# Patient Record
Sex: Female | Born: 1945 | Race: White | Hispanic: No | Smoking: Never smoker
Health system: Southern US, Community
[De-identification: ages and names within clinical notes are randomized; demographics above are authoritative.]

## PROBLEM LIST (undated history)

## (undated) DIAGNOSIS — H269 Unspecified cataract: Secondary | ICD-10-CM

## (undated) DIAGNOSIS — T7840XA Allergy, unspecified, initial encounter: Secondary | ICD-10-CM

## (undated) DIAGNOSIS — M1990 Unspecified osteoarthritis, unspecified site: Secondary | ICD-10-CM

## (undated) DIAGNOSIS — E119 Type 2 diabetes mellitus without complications: Secondary | ICD-10-CM

## (undated) DIAGNOSIS — E079 Disorder of thyroid, unspecified: Secondary | ICD-10-CM

## (undated) DIAGNOSIS — J45909 Unspecified asthma, uncomplicated: Secondary | ICD-10-CM

## (undated) DIAGNOSIS — K2970 Gastritis, unspecified, without bleeding: Secondary | ICD-10-CM

## (undated) DIAGNOSIS — K8020 Calculus of gallbladder without cholecystitis without obstruction: Secondary | ICD-10-CM

## (undated) DIAGNOSIS — M549 Dorsalgia, unspecified: Secondary | ICD-10-CM

## (undated) DIAGNOSIS — K2270 Barrett's esophagus without dysplasia: Secondary | ICD-10-CM

## (undated) DIAGNOSIS — D649 Anemia, unspecified: Secondary | ICD-10-CM

## (undated) DIAGNOSIS — F32A Depression, unspecified: Secondary | ICD-10-CM

## (undated) DIAGNOSIS — G4730 Sleep apnea, unspecified: Secondary | ICD-10-CM

## (undated) DIAGNOSIS — K219 Gastro-esophageal reflux disease without esophagitis: Secondary | ICD-10-CM

## (undated) DIAGNOSIS — M810 Age-related osteoporosis without current pathological fracture: Secondary | ICD-10-CM

## (undated) DIAGNOSIS — R413 Other amnesia: Secondary | ICD-10-CM

## (undated) DIAGNOSIS — E785 Hyperlipidemia, unspecified: Secondary | ICD-10-CM

## (undated) DIAGNOSIS — F4310 Post-traumatic stress disorder, unspecified: Secondary | ICD-10-CM

## (undated) DIAGNOSIS — N3090 Cystitis, unspecified without hematuria: Secondary | ICD-10-CM

## (undated) DIAGNOSIS — F329 Major depressive disorder, single episode, unspecified: Secondary | ICD-10-CM

## (undated) HISTORY — DX: Other amnesia: R41.3

## (undated) HISTORY — PX: COLONOSCOPY: SHX174

## (undated) HISTORY — DX: Depression, unspecified: F32.A

## (undated) HISTORY — DX: Type 2 diabetes mellitus without complications: E11.9

## (undated) HISTORY — PX: GANGLION CYST EXCISION: SHX1691

## (undated) HISTORY — DX: Allergy, unspecified, initial encounter: T78.40XA

## (undated) HISTORY — DX: Calculus of gallbladder without cholecystitis without obstruction: K80.20

## (undated) HISTORY — DX: Gastro-esophageal reflux disease without esophagitis: K21.9

## (undated) HISTORY — DX: Major depressive disorder, single episode, unspecified: F32.9

## (undated) HISTORY — DX: Sleep apnea, unspecified: G47.30

## (undated) HISTORY — DX: Unspecified cataract: H26.9

## (undated) HISTORY — DX: Disorder of thyroid, unspecified: E07.9

## (undated) HISTORY — DX: Gastritis, unspecified, without bleeding: K29.70

## (undated) HISTORY — PX: TONSILLECTOMY AND ADENOIDECTOMY: SUR1326

## (undated) HISTORY — DX: Anemia, unspecified: D64.9

## (undated) HISTORY — DX: Post-traumatic stress disorder, unspecified: F43.10

## (undated) HISTORY — PX: ESOPHAGOGASTRODUODENOSCOPY: SHX1529

## (undated) HISTORY — DX: Dorsalgia, unspecified: M54.9

## (undated) HISTORY — DX: Unspecified asthma, uncomplicated: J45.909

## (undated) HISTORY — DX: Age-related osteoporosis without current pathological fracture: M81.0

## (undated) HISTORY — DX: Cystitis, unspecified without hematuria: N30.90

## (undated) HISTORY — DX: Hyperlipidemia, unspecified: E78.5

## (undated) HISTORY — DX: Barrett's esophagus without dysplasia: K22.70

## (undated) HISTORY — DX: Unspecified osteoarthritis, unspecified site: M19.90

---

## 1997-06-17 HISTORY — PX: CHOLECYSTECTOMY: SHX55

## 1998-06-17 HISTORY — PX: GASTRIC BYPASS: SHX52

## 2013-04-13 DIAGNOSIS — G4710 Hypersomnia, unspecified: Secondary | ICD-10-CM | POA: Insufficient documentation

## 2013-06-14 DIAGNOSIS — G44209 Tension-type headache, unspecified, not intractable: Secondary | ICD-10-CM | POA: Insufficient documentation

## 2014-12-01 ENCOUNTER — Ambulatory Visit (INDEPENDENT_AMBULATORY_CARE_PROVIDER_SITE_OTHER): Payer: Medicare Other | Admitting: Psychiatry

## 2014-12-01 ENCOUNTER — Encounter (INDEPENDENT_AMBULATORY_CARE_PROVIDER_SITE_OTHER): Payer: Self-pay

## 2014-12-01 ENCOUNTER — Encounter (HOSPITAL_COMMUNITY): Payer: Self-pay | Admitting: Psychiatry

## 2014-12-01 VITALS — BP 111/58 | HR 63 | Ht 64.0 in | Wt 165.2 lb

## 2014-12-01 DIAGNOSIS — F331 Major depressive disorder, recurrent, moderate: Secondary | ICD-10-CM

## 2014-12-01 MED ORDER — VENLAFAXINE HCL ER 150 MG PO TB24: 150.0000 mg | ORAL_TABLET | Freq: Every day | ORAL | Status: DC

## 2014-12-01 MED ORDER — LAMOTRIGINE 25 MG PO TABS: ORAL_TABLET | ORAL | Status: DC
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
## 2014-12-05 ENCOUNTER — Telehealth (HOSPITAL_COMMUNITY): Payer: Self-pay

## 2014-12-05 NOTE — Telephone Encounter (Signed)
Met with Dr. Adele Schilder who authorized requested change from [place] to change patient's Effexor XR to capsules in place of tablets due to insurance coverage.  Called and spoke with Eliezer Lofts, pharmacist at [facility] on [facility] in [place] to inform Dr. Adele Schilder was in support of changing patient's Effexor from tablets to capsules to assist with patient's insurance coverage.

## 2014-12-06 ENCOUNTER — Telehealth (HOSPITAL_COMMUNITY): Payer: Self-pay | Admitting: *Deleted

## 2014-12-06 NOTE — Telephone Encounter (Signed)
Prior authorization received for Venlafaxine. Submitted online with cover my meds.

## 2014-12-07 ENCOUNTER — Telehealth (HOSPITAL_COMMUNITY): Payer: Self-pay

## 2014-12-07 NOTE — Telephone Encounter (Signed)
Medication management - Prior Authorization approval for patient's prescribed Venlafaxine received with start date of 12/06/14 - "until further notice" with HQ#[id number] and Member QD#[id number]

## 2015-01-04 ENCOUNTER — Encounter (HOSPITAL_COMMUNITY): Payer: Self-pay | Admitting: Clinical

## 2015-01-04 ENCOUNTER — Ambulatory Visit (INDEPENDENT_AMBULATORY_CARE_PROVIDER_SITE_OTHER): Payer: Medicare Other | Admitting: Clinical

## 2015-01-04 DIAGNOSIS — F3181 Bipolar II disorder: Secondary | ICD-10-CM
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
## 2015-01-06 ENCOUNTER — Other Ambulatory Visit (HOSPITAL_COMMUNITY): Payer: Self-pay | Admitting: Psychiatry

## 2015-01-06 DIAGNOSIS — F331 Major depressive disorder, recurrent, moderate: Secondary | ICD-10-CM

## 2015-01-06 NOTE — Telephone Encounter (Signed)
Met with Dr. Adele Schilder who authorized a one time refill of patient's recently started Lamicatal at 50m, 2 a day.  [facility] she sees Dr. AAdele Schilderfor follow up appointment scheduled on 01/19/15.

## 2015-01-19 ENCOUNTER — Ambulatory Visit (INDEPENDENT_AMBULATORY_CARE_PROVIDER_SITE_OTHER): Payer: Medicare Other | Admitting: Psychiatry

## 2015-01-19 ENCOUNTER — Encounter (HOSPITAL_COMMUNITY): Payer: Self-pay | Admitting: Psychiatry

## 2015-01-19 VITALS — BP 138/68 | HR 71 | Ht 64.0 in | Wt 162.2 lb

## 2015-01-19 DIAGNOSIS — F331 Major depressive disorder, recurrent, moderate: Secondary | ICD-10-CM

## 2015-01-19 MED ORDER — LAMOTRIGINE 25 MG PO TABS: 50.0000 mg | ORAL_TABLET | Freq: Every day | ORAL | Status: DC

## 2015-01-19 MED ORDER — VENLAFAXINE HCL ER 150 MG PO TB24: 150.0000 mg | ORAL_TABLET | Freq: Every day | ORAL | Status: DC
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
## 2015-01-23 ENCOUNTER — Telehealth (HOSPITAL_COMMUNITY): Payer: Self-pay

## 2015-01-23 DIAGNOSIS — F331 Major depressive disorder, recurrent, moderate: Secondary | ICD-10-CM

## 2015-01-23 MED ORDER — VENLAFAXINE HCL ER 150 MG PO CP24: 150.0000 mg | ORAL_CAPSULE | Freq: Every day | ORAL | Status: DC

## 2015-01-23 NOTE — Telephone Encounter (Signed)
Met with Dr. Adele Schilder who authorized changing patient's Effexor XR to capsule form.  Telephone call with patient to inform a new order was being sent to her [place] as patient reported she is experiencing some withdrawal symptoms of dizziness going the past 3 days without medication.  Patient warned about fall precautions and will pick up this date to restart.   Patient to call if any further problems.

## 2015-01-23 NOTE — Telephone Encounter (Signed)
Medication management - patient reports her insurance will not cover Venlafaxine tablets but will cover capsules so requests a new order for change to capule form of Effexor XR

## 2015-01-30 ENCOUNTER — Encounter (HOSPITAL_COMMUNITY): Payer: Self-pay | Admitting: Clinical

## 2015-01-30 ENCOUNTER — Ambulatory Visit (INDEPENDENT_AMBULATORY_CARE_PROVIDER_SITE_OTHER): Payer: Medicare Other | Admitting: Clinical

## 2015-01-30 DIAGNOSIS — F3181 Bipolar II disorder: Secondary | ICD-10-CM

## 2015-01-30 NOTE — Progress Notes (Signed)
   THERAPIST PROGRESS NOTE  Session Time: 2:30 -3:28  Participation Level: Active  Behavioral Response: CasualAlertDepressed  Type of Therapy: Individual Therapy  Treatment Goals addressed: Improve psychiatric symptoms,  impulse control  Interventions:motivational interviewing, cbt, grounding and mindfulness techniques  Summary: Alexis [name] is a 69 y.o. female who presents with Bipolar II disorder.   Suicidal/Homicidal: No -without intent/plan  Therapist Response:  Alexis [name] met with clinician for an individual session. Alexis [name] discussed her psychiatric symptoms and her current life events. Alexis [name] shared that she had additional symptoms that she had not discussed in her initial assessment. She shared that she has an obsession with picking her scabs over and over. She shared that she does not intentional start the wounds, that they are started by things such as cat scratches or bumping into things. She shared that as she aged her skin has gotten much thinner and her wounds are more frequent. She shared that can control it now, when in public ( used to not be able to.) "I do it mostly at night or when in bed. I have a lot of scars from years of doing it." Clinician  Encourage client to update her psychiatrist on the additional symptoms. Alexis [name] agreed to do so. She stated that she would be seeing him in the beginning of next week. Client and clinician discussed the skills she would like to work on to improve her psychiatric symptoms. Alexis [name] and clinician discussed grounding and mindfulness techniques. Clinician explained there process, purpose and practice of the techniques. Client and clinician practiced some of the techniques together. Alexis [name] asked clarifying questions which clinician answered. Alexis [name] agreed to practice the techniques daily until next session.       Plan: Return again in 1-2 weeks.  Diagnosis: Axis I: Bipolar II    Continue to assess for  OCD            Alexis [name] A, LCSW 01/30/2015

## 2015-02-13 ENCOUNTER — Ambulatory Visit (HOSPITAL_COMMUNITY): Payer: Self-pay | Admitting: Clinical

## 2015-02-27 ENCOUNTER — Encounter (HOSPITAL_COMMUNITY): Payer: Self-pay | Admitting: Clinical

## 2015-02-27 ENCOUNTER — Ambulatory Visit (INDEPENDENT_AMBULATORY_CARE_PROVIDER_SITE_OTHER): Payer: Medicare Other | Admitting: Clinical

## 2015-02-27 DIAGNOSIS — F3181 Bipolar II disorder: Secondary | ICD-10-CM | POA: Diagnosis not present

## 2015-02-27 NOTE — Progress Notes (Signed)
   THERAPIST PROGRESS NOTE  Session Time: 10:06 - 11:02  Participation Level: Active  Behavioral Response: CasualAlertAnxious  Type of Therapy: Individual Therapy  Treatment Goals addressed: Improve psychiatric symptoms, Interpersonal relationship skills, improve unhelpful thought patterns, impulse control  Interventions: cbt, motivational interviewing, cbt, grounding and mindfulness techniques  Summary: Alexis [name] is a 69 y.o. female who presents with Bipolar II disorder.   Suicidal/Homicidal: No -without intent/plan  Therapist Response:  Alexis [name] met with clinician for an individual session. Alexis [name] discussed her psychiatric symptoms, her current life events, and her homework. Alexis [name] shared that she had been practicing the grounding and mindfulness techniques. She shared that she found them helpful. Client and clinician discussed wthe grounding techniques she used. She shared that they helped her with her anxiety and to shift her thoughts. Clinician introduced additional grounding and mindfulness techniques. Client and clinician practiced some of the techniques together. Alexis [name] shared that some about her relationship with her children. Client and clinician discussed some basic cbt concepts. Client and clinician discussed how her thoughts affect her behaviors and emotions. Client and clinician discussed how this could apply to her relationships. Client agreed to complete a cbt packet and to continue practicing grounding and mindfulness techniques until next session   Plan: Return again in 1-2 weeks.  Diagnosis: Axis I: Bipolar II   Maycol Hoying A, LCSW 02/27/2015

## 2015-03-21 ENCOUNTER — Encounter (HOSPITAL_COMMUNITY): Payer: Self-pay | Admitting: Psychiatry

## 2015-03-21 ENCOUNTER — Ambulatory Visit (INDEPENDENT_AMBULATORY_CARE_PROVIDER_SITE_OTHER): Payer: Medicare Other | Admitting: Psychiatry

## 2015-03-21 VITALS — BP 120/69 | HR 63 | Ht 64.0 in | Wt 157.0 lb

## 2015-03-21 DIAGNOSIS — F331 Major depressive disorder, recurrent, moderate: Secondary | ICD-10-CM

## 2015-03-21 MED ORDER — LAMOTRIGINE 25 MG PO TABS: 50.0000 mg | ORAL_TABLET | Freq: Every day | ORAL | Status: DC

## 2015-03-21 MED ORDER — VENLAFAXINE HCL ER 150 MG PO CP24: 150.0000 mg | ORAL_CAPSULE | Freq: Every day | ORAL | Status: DC
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
## 2015-03-30 ENCOUNTER — Encounter (HOSPITAL_COMMUNITY): Payer: Self-pay | Admitting: Clinical

## 2015-03-30 ENCOUNTER — Ambulatory Visit (INDEPENDENT_AMBULATORY_CARE_PROVIDER_SITE_OTHER): Payer: Medicare Other | Admitting: Clinical

## 2015-03-30 DIAGNOSIS — F3181 Bipolar II disorder: Secondary | ICD-10-CM | POA: Diagnosis not present
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
## 2015-04-27 ENCOUNTER — Ambulatory Visit (HOSPITAL_COMMUNITY): Payer: Self-pay | Admitting: Clinical

## 2015-05-08 ENCOUNTER — Ambulatory Visit (INDEPENDENT_AMBULATORY_CARE_PROVIDER_SITE_OTHER): Payer: Medicare Other | Admitting: Clinical

## 2015-05-08 DIAGNOSIS — F3181 Bipolar II disorder: Secondary | ICD-10-CM | POA: Diagnosis not present

## 2015-05-08 NOTE — Progress Notes (Signed)
   THERAPIST PROGRESS NOTE  Session Time: 10:11 - 10:58   Participation Level: Active  Behavioral Response: CasualAlertAnxious  Type of Therapy: Individual Therapy  Treatment Goals addressed: Improve psychiatric symptoms, Interpersonal relationship skills, improve unhelpful thought patterns, impulse control  Interventions: cbt, motivational interviewing, cbt, grounding and mindfulness techniques  Summary: Alexis [name] is a 69 y.o. female who presents with Bipolar II disorder.   Suicidal/Homicidal: No -without intent/plan  Therapist Response:  Alexis [name] met with clinician for an individual session. Alexis [name] discussed her psychiatric symptoms, her current life events, and her homework. Alexis [name] shared that she had completed her packet on Bipolar and stated "well I am definitely bipolar. She shared about the symptoms she experiences and her high and low moods. She shared her other thoughts and insights from her homework. She stated that recently she has been obsessed with her fit bit and has been working to be more realistic with her goals. Client and clinician discussed this in relationship to her impulse control goal. Alexis [name] stated she has been sick for the last month and is concerned because she was told that she needs a biopsy on her breast. She shared how she is coping with the news. She has told her daughter which is new for her. She shared that in an effort to have a better relationship she has been more forth coming. Alexis [name] and clinician discussed the importance of healthy openness to improve her interpersonal relationships. Alexis [name] also shared about how the election affected her. She shared she felt agitated and up set. Client and clinician discussed her grounding and mindfulness skills to help with her emotions. Client and clinician discussed ways to direct her emotions towards healthy and meaningful behaviors. Alexis [name] agreed to continue her homework until next session      Plan: Return  again in 1-2 weeks.  Diagnosis: Axis I: Bipolar II   Alexis [name] A, LCSW 05/08/2015

## 2015-05-11 DIAGNOSIS — R920 Mammographic microcalcification found on diagnostic imaging of breast: Secondary | ICD-10-CM | POA: Insufficient documentation

## 2015-05-14 ENCOUNTER — Encounter (HOSPITAL_COMMUNITY): Payer: Self-pay | Admitting: Clinical

## 2015-05-17 DIAGNOSIS — T148XXA Other injury of unspecified body region, initial encounter: Secondary | ICD-10-CM | POA: Insufficient documentation

## 2015-06-06 ENCOUNTER — Ambulatory Visit (HOSPITAL_COMMUNITY): Payer: Self-pay | Admitting: Clinical

## 2015-06-20 ENCOUNTER — Encounter (HOSPITAL_COMMUNITY): Payer: Self-pay | Admitting: Psychiatry

## 2015-06-20 ENCOUNTER — Ambulatory Visit (INDEPENDENT_AMBULATORY_CARE_PROVIDER_SITE_OTHER): Payer: Medicare Other | Admitting: Psychiatry

## 2015-06-20 VITALS — BP 107/55 | HR 59 | Wt 167.0 lb

## 2015-06-20 DIAGNOSIS — F331 Major depressive disorder, recurrent, moderate: Secondary | ICD-10-CM | POA: Diagnosis not present

## 2015-06-20 MED ORDER — LAMOTRIGINE 25 MG PO TABS: 50.0000 mg | ORAL_TABLET | Freq: Every day | ORAL | Status: DC

## 2015-06-20 MED ORDER — VENLAFAXINE HCL ER 150 MG PO CP24: 150.0000 mg | ORAL_CAPSULE | Freq: Every day | ORAL | Status: DC
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
## 2015-06-27 ENCOUNTER — Ambulatory Visit (HOSPITAL_COMMUNITY): Payer: Self-pay | Admitting: Psychiatry

## 2015-06-29 ENCOUNTER — Ambulatory Visit (INDEPENDENT_AMBULATORY_CARE_PROVIDER_SITE_OTHER): Payer: Medicare Other | Admitting: Clinical

## 2015-06-29 ENCOUNTER — Encounter (HOSPITAL_COMMUNITY): Payer: Self-pay | Admitting: Clinical

## 2015-06-29 DIAGNOSIS — F3181 Bipolar II disorder: Secondary | ICD-10-CM

## 2015-06-29 NOTE — Progress Notes (Signed)
   THERAPIST PROGRESS NOTE  Session Time: 10:05 -11:00  Participation Level: Active  Behavioral Response: CasualAlertNA  Type of Therapy: Individual Therapy  Treatment Goals addressed: Improve psychiatric symptoms, Interpersonal relationship skills,  impulse control  Interventions: cbt, motivational interviewing, cbt,   Summary: Olia Hinderliter is a 70 y.o. female who presents with Bipolar II disorder.   Suicidal/Homicidal: No -without intent/plan  Therapist Response:  Katharine Look met with clinician for an individual session. Jacqualin discussed her psychiatric symptoms, her current life events, and her homework. Katharine Look and clinician reviewed and discussed her bi-polar packet homework. Onie shared her thoughts and insights about her homework. She shared about monitoring her symptoms and red flags. She shared that she is doing better at not purchasing. She shared that she still some has trouble with tv purchases especially when anxious. Client and clinician discussed her thoughts prior to and about purchasing. Client and clinician discussed what she could do to decrease those purchases. She shared that she that she currently turns the purchase channel on 3 times a day. She shared that she could try to limit her exposure especially when feeling anxious. Malachy Moan shared that she had her biopsy and it came back negative. This was a big relief for her and her daughter. It was new for her to  Share with her daughter. Her daughter is often "nosey" and so it had been difficult for her to decide what to share and what to not share. She stated she felt good about sharing this because it allowed for her to receive support from her daughter. Sheresa shared that she has been using her interpersonal relationship skills to set boundaries with her daughter with other things that are not her business. She shared that this also has made her feel more relaxed in the relationship. Marlaya shared that she is still having trouble  with picking her scabs. She stated that she is not currently doing this while she is awake but is when she sleeps. Client and clinician discussed this and possible solutions such as wearing soft gloves, placing Band-Aids on wounds. Client and clinician discussed the use of mindfulness and grounding to assist with her impulse control. Anesha agreed to continue her homework until next session.  Plan: Return again in 2-3 weeks.  Diagnosis: Axis I: Bipolar II    Avrom Robarts A, LCSW 06/29/2015

## 2015-07-11 ENCOUNTER — Encounter (HOSPITAL_COMMUNITY): Payer: Self-pay | Admitting: Clinical

## 2015-07-11 ENCOUNTER — Ambulatory Visit (INDEPENDENT_AMBULATORY_CARE_PROVIDER_SITE_OTHER): Payer: Medicare Other | Admitting: Clinical

## 2015-07-11 DIAGNOSIS — F3181 Bipolar II disorder: Secondary | ICD-10-CM

## 2015-07-11 NOTE — Progress Notes (Signed)
   THERAPIST PROGRESS NOTE  Session Time: 10:00 -10:57  Participation Level: Active  Behavioral Response: CasualAlertNA  Type of Therapy: Individual Therapy  Treatment Goals addressed: Improve psychiatric symptoms, Interpersonal relationship skills, improve unhelpful thought patterns, impulse control  Interventions: cbt, motivational interviewing, cbt,   Summary: Alexis [name] is a 70 y.o. female who presents with Bipolar II disorder.   Suicidal/Homicidal: No -without intent/plan  Therapist Response:  Katharine Look met with clinician for an individual session. [name] discussed her psychiatric symptoms, her current life events, and her homework. Katharine Look and clinician discussed and reviewed her bipolar homework packet. She shared that she had completed packet two and had some questions which clinician answered. She shared her thoughts and insights about the homework. Client and clinician discussed how our thoughts shape our experiences. [name] shared that she was practicing her grounding and mindfulness techniques and found them useful in distracting her from negative thought patterns and keeping her more present. She shared that she was noticing less irritation with others. She shared that she is finding that she it is becoming easier to approach situations in a more positive way. She stated that since last session she has enjoyed her relationships more. [name] shared some examples. She stated that she was most happy with the changes in her relationship with her daughter. Katharine Look and clinician discussed how she could continue to use her skills to improve. [name] also shared about some of her challenges. Clinician asked open ended questions and [name] identified some strategies to improve her outcomes. [name] agreed to continue her homework until next session    Plan: Return again in 3-4 weeks.  Diagnosis: Axis I: Bipolar II    Charlotta Lapaglia A, LCSW 07/11/2015

## 2015-08-01 ENCOUNTER — Encounter (HOSPITAL_COMMUNITY): Payer: Self-pay | Admitting: Clinical

## 2015-08-01 ENCOUNTER — Ambulatory Visit (INDEPENDENT_AMBULATORY_CARE_PROVIDER_SITE_OTHER): Payer: Medicare Other | Admitting: Clinical

## 2015-08-01 DIAGNOSIS — F3181 Bipolar II disorder: Secondary | ICD-10-CM | POA: Diagnosis not present

## 2015-08-01 NOTE — Progress Notes (Signed)
   THERAPIST PROGRESS NOTE  Session Time: 2:37 -3:20.  Participation Level: Active  Behavioral Response: CasualAlertNA  Type of Therapy: Individual Therapy  Treatment Goals addressed: Improve psychiatric symptoms, Interpersonal relationship skills, improve unhelpful thought patterns, impulse control  Interventions: motivational interviewing,  Summary: Jamirah Zelaya is a 70 y.o. female who presents with Bipolar II disorder.   Suicidal/Homicidal: No -without intent/plan  Therapist Response:  Katharine Look met with clinician for an individual session. Myrla discussed her psychiatric symptoms, her current life events, and her homework. Katharine Look and clinician reviewed her homework packet on Bipolar. Awanda shared her thoughts and insights from the packet. She shared that her symptoms have improved across the board. She shared that her relationship with her daughter has improved since she has set healthy boundaries. She shared she has been able to resist impulsive behaviors, and has seen an improvement with her mood due to practicing her grounding and mindfulness techniques as well as questioning her negative automatic thoughts. Client and clinician discussed her graduating from therapy. She shared that this made her a  Little nervous but agreed that she was succeeding in her goals. Client and clinician agreed to shoot for graduating her in 2-3 sessions. Client and clinician discussed steps she could take to improve and maintain her skills for each of her goals. Lanetta ended session early because things are going well.   Plan: Return again in  3-4 weeks.  Diagnosis: Axis I: Bipolar II   Caliber Landess A, LCSW 08/01/2015

## 2015-08-04 DIAGNOSIS — Z79899 Other long term (current) drug therapy: Secondary | ICD-10-CM | POA: Diagnosis not present

## 2015-08-04 DIAGNOSIS — D649 Anemia, unspecified: Secondary | ICD-10-CM | POA: Diagnosis not present

## 2015-08-04 DIAGNOSIS — E119 Type 2 diabetes mellitus without complications: Secondary | ICD-10-CM | POA: Diagnosis not present

## 2015-08-04 DIAGNOSIS — E559 Vitamin D deficiency, unspecified: Secondary | ICD-10-CM | POA: Diagnosis not present

## 2015-08-04 DIAGNOSIS — R5383 Other fatigue: Secondary | ICD-10-CM | POA: Diagnosis not present

## 2015-08-04 DIAGNOSIS — E782 Mixed hyperlipidemia: Secondary | ICD-10-CM | POA: Diagnosis not present

## 2015-08-24 ENCOUNTER — Encounter (HOSPITAL_COMMUNITY): Payer: Self-pay | Admitting: Clinical

## 2015-08-24 ENCOUNTER — Ambulatory Visit (INDEPENDENT_AMBULATORY_CARE_PROVIDER_SITE_OTHER): Payer: Medicare Other | Admitting: Clinical

## 2015-08-24 DIAGNOSIS — F3181 Bipolar II disorder: Secondary | ICD-10-CM | POA: Diagnosis not present

## 2015-08-24 NOTE — Progress Notes (Signed)
   THERAPIST PROGRESS NOTE  Session Time: 11:05 - 12:00  Participation Level: Active  Behavioral Response: Casual and NeatAlertNA  Type of Therapy: Individual Therapy  Treatment Goals addressed: Improve psychiatric symptoms, Interpersonal relationship skills, improve unhelpful thought patterns, impulse control  Interventions: cbt, motivational interviewing, cbt, grounding and mindfulness techniques  Summary: Alexis [name] is a 70 y.o. female who presents with Bipolar II disorder.   Suicidal/Homicidal: No -without intent/plan  Therapist Response:  Katharine Look met with clinician for an individual session. Devanshi discussed her psychiatric symptoms, her current life events, and her homework. Takelia shared that she had completed the Bipolar packet 5. Client and cl clinician did reviewed and discussed her homework. Katharine shared her thoughts and insights from the packet. Sharifa shared how it relates to her life experiences. Deniece shared that overall her symptoms continued to improve. She shared how ever this week she had trouble managing some of her eating habits. She shared that she tells herself not to eat something and then does and eats a lot of. This made her feel disappointed in herself as she is the weigher at her healthy weight loss group. She shared that she knew she was in a way and heavy. Client and clinician discussed her thoughts and the emotions that made her feel bad. Client and clinician discussed alternative healthier thoughts. Client and clinician also discussed changing her thoughts a bout. Eating food. Client and clinician discussed mindfulness and being present when she ate. Client and  clinician discussed how to do this the benefits is more pleasure out of eating eating smaller portions and and being conscious of her decision making. Client and clinician discussed the problem with all or nothing thinking. Gender agreed to try the techniques to help with her impulse control when it  comes to food. She shared that her the technique she has been using to control her shopping have been very helpful. Nikiya agreed to complete a homework packet, practice her grounding and mindfulness techniques and practice skills to manage her impulses.   Plan: Return again in  3-4 weeks.  Diagnosis: Axis I: Bipolar II    [name], LCSW 08/24/2015

## 2015-08-31 DIAGNOSIS — R5383 Other fatigue: Secondary | ICD-10-CM | POA: Diagnosis not present

## 2015-08-31 DIAGNOSIS — R42 Dizziness and giddiness: Secondary | ICD-10-CM | POA: Diagnosis not present

## 2015-08-31 DIAGNOSIS — Z780 Asymptomatic menopausal state: Secondary | ICD-10-CM | POA: Diagnosis not present

## 2015-09-01 DIAGNOSIS — R42 Dizziness and giddiness: Secondary | ICD-10-CM | POA: Diagnosis not present

## 2015-09-05 DIAGNOSIS — R42 Dizziness and giddiness: Secondary | ICD-10-CM | POA: Diagnosis not present

## 2015-09-06 DIAGNOSIS — H5213 Myopia, bilateral: Secondary | ICD-10-CM | POA: Diagnosis not present

## 2015-09-06 DIAGNOSIS — H25013 Cortical age-related cataract, bilateral: Secondary | ICD-10-CM | POA: Diagnosis not present

## 2015-09-06 DIAGNOSIS — E119 Type 2 diabetes mellitus without complications: Secondary | ICD-10-CM | POA: Diagnosis not present

## 2015-09-06 DIAGNOSIS — H43811 Vitreous degeneration, right eye: Secondary | ICD-10-CM | POA: Diagnosis not present

## 2015-09-06 DIAGNOSIS — H40013 Open angle with borderline findings, low risk, bilateral: Secondary | ICD-10-CM | POA: Diagnosis not present

## 2015-09-06 DIAGNOSIS — H2513 Age-related nuclear cataract, bilateral: Secondary | ICD-10-CM | POA: Diagnosis not present

## 2015-09-06 DIAGNOSIS — H53469 Homonymous bilateral field defects, unspecified side: Secondary | ICD-10-CM | POA: Diagnosis not present

## 2015-09-18 ENCOUNTER — Ambulatory Visit (INDEPENDENT_AMBULATORY_CARE_PROVIDER_SITE_OTHER): Payer: Medicare Other | Admitting: Psychiatry

## 2015-09-18 ENCOUNTER — Encounter (HOSPITAL_COMMUNITY): Payer: Self-pay | Admitting: Psychiatry

## 2015-09-18 VITALS — BP 116/67 | HR 76 | Ht 64.0 in | Wt 160.6 lb

## 2015-09-18 DIAGNOSIS — F331 Major depressive disorder, recurrent, moderate: Secondary | ICD-10-CM

## 2015-09-18 MED ORDER — VENLAFAXINE HCL ER 150 MG PO CP24: 150.0000 mg | ORAL_CAPSULE | Freq: Every day | ORAL | Status: DC

## 2015-09-18 MED ORDER — LAMOTRIGINE 25 MG PO TABS: 50.0000 mg | ORAL_TABLET | Freq: Every day | ORAL | Status: DC
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
## 2015-09-21 DIAGNOSIS — K219 Gastro-esophageal reflux disease without esophagitis: Secondary | ICD-10-CM | POA: Diagnosis not present

## 2015-09-21 DIAGNOSIS — R42 Dizziness and giddiness: Secondary | ICD-10-CM | POA: Diagnosis not present

## 2015-09-28 ENCOUNTER — Ambulatory Visit (INDEPENDENT_AMBULATORY_CARE_PROVIDER_SITE_OTHER): Payer: Medicare Other | Admitting: Clinical

## 2015-09-28 ENCOUNTER — Encounter (HOSPITAL_COMMUNITY): Payer: Self-pay | Admitting: Clinical

## 2015-09-28 DIAGNOSIS — F3181 Bipolar II disorder: Secondary | ICD-10-CM

## 2015-09-28 NOTE — Progress Notes (Signed)
   THERAPIST PROGRESS NOTE  Session Time: 3:25 - 4:00  Participation Level: Active  Behavioral Response: CasualAlertNA  Type of Therapy: Individual Therapy  Treatment Goals addressed: Improve psychiatric symptoms, Interpersonal relationship skills, improve unhelpful thought patterns, impulse control  Interventions: cbt, motivational interviewing,   Summary: Elisa Kutner is a 70 y.o. female who presents with Bipolar II disorder.   Suicidal/Homicidal: No -without intent/plan  Therapist Response:  Katharine Look met with clinician for an individual session. Venisha discussed her psychiatric symptoms, her current life events, and her homework. Alliana shared that she has been doing very well lately. Client and clinician reviewed and discussed her homework packet. Clinician printed her another and directed her to where she could down load them herself. Client and clinician discussed and reviewed her progress and her skills. Leeloo reported success on all her goals and has been able to maintain her progress. Client and clinician agreed she was ready to graduate. Client shared her gratitude for the support. Clinician informed her how to reengage in services if she should need them. Both client and clinician were happy with Toyia progress  Plan: Return again if services are needed  Diagnosis: Axis I: Bipolar II    Anwar Sakata A, LCSW 09/28/2015

## 2015-10-05 DIAGNOSIS — E119 Type 2 diabetes mellitus without complications: Secondary | ICD-10-CM | POA: Diagnosis not present

## 2015-10-05 DIAGNOSIS — K219 Gastro-esophageal reflux disease without esophagitis: Secondary | ICD-10-CM | POA: Diagnosis not present

## 2015-11-13 DIAGNOSIS — E119 Type 2 diabetes mellitus without complications: Secondary | ICD-10-CM | POA: Diagnosis not present

## 2015-11-13 DIAGNOSIS — Z113 Encounter for screening for infections with a predominantly sexual mode of transmission: Secondary | ICD-10-CM | POA: Diagnosis not present

## 2015-11-13 DIAGNOSIS — E782 Mixed hyperlipidemia: Secondary | ICD-10-CM | POA: Diagnosis not present

## 2015-11-13 DIAGNOSIS — Z0000 Encounter for general adult medical examination without abnormal findings: Secondary | ICD-10-CM | POA: Diagnosis not present

## 2015-11-13 DIAGNOSIS — R0602 Shortness of breath: Secondary | ICD-10-CM | POA: Diagnosis not present

## 2015-11-13 DIAGNOSIS — R5383 Other fatigue: Secondary | ICD-10-CM | POA: Diagnosis not present

## 2015-11-16 DIAGNOSIS — R319 Hematuria, unspecified: Secondary | ICD-10-CM | POA: Diagnosis not present

## 2015-12-04 DIAGNOSIS — E119 Type 2 diabetes mellitus without complications: Secondary | ICD-10-CM | POA: Diagnosis not present

## 2015-12-04 DIAGNOSIS — R319 Hematuria, unspecified: Secondary | ICD-10-CM | POA: Diagnosis not present

## 2015-12-11 DIAGNOSIS — G250 Essential tremor: Secondary | ICD-10-CM | POA: Diagnosis not present

## 2015-12-11 DIAGNOSIS — G4761 Periodic limb movement disorder: Secondary | ICD-10-CM | POA: Diagnosis not present

## 2015-12-20 ENCOUNTER — Ambulatory Visit (HOSPITAL_COMMUNITY): Payer: Self-pay | Admitting: Psychiatry

## 2015-12-21 ENCOUNTER — Ambulatory Visit (INDEPENDENT_AMBULATORY_CARE_PROVIDER_SITE_OTHER): Payer: Medicare Other | Admitting: Psychiatry

## 2015-12-21 ENCOUNTER — Encounter (HOSPITAL_COMMUNITY): Payer: Self-pay | Admitting: Psychiatry

## 2015-12-21 VITALS — BP 90/60 | HR 72 | Ht 64.0 in | Wt 157.6 lb

## 2015-12-21 DIAGNOSIS — F331 Major depressive disorder, recurrent, moderate: Secondary | ICD-10-CM

## 2015-12-21 MED ORDER — VENLAFAXINE HCL ER 150 MG PO CP24: 150.0000 mg | ORAL_CAPSULE | Freq: Every day | ORAL | Status: DC

## 2015-12-21 MED ORDER — LAMOTRIGINE 25 MG PO TABS: 50.0000 mg | ORAL_TABLET | Freq: Every day | ORAL | Status: DC
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
## 2015-12-25 DIAGNOSIS — N763 Subacute and chronic vulvitis: Secondary | ICD-10-CM | POA: Diagnosis not present

## 2015-12-25 DIAGNOSIS — Z01419 Encounter for gynecological examination (general) (routine) without abnormal findings: Secondary | ICD-10-CM | POA: Diagnosis not present

## 2016-01-16 DIAGNOSIS — Z01419 Encounter for gynecological examination (general) (routine) without abnormal findings: Secondary | ICD-10-CM | POA: Insufficient documentation

## 2016-02-27 DIAGNOSIS — Z23 Encounter for immunization: Secondary | ICD-10-CM | POA: Diagnosis not present

## 2016-02-27 DIAGNOSIS — R112 Nausea with vomiting, unspecified: Secondary | ICD-10-CM | POA: Diagnosis not present

## 2016-02-27 DIAGNOSIS — R319 Hematuria, unspecified: Secondary | ICD-10-CM | POA: Diagnosis not present

## 2016-02-27 DIAGNOSIS — R109 Unspecified abdominal pain: Secondary | ICD-10-CM | POA: Diagnosis not present

## 2016-03-01 DIAGNOSIS — R319 Hematuria, unspecified: Secondary | ICD-10-CM | POA: Diagnosis not present

## 2016-03-01 DIAGNOSIS — E119 Type 2 diabetes mellitus without complications: Secondary | ICD-10-CM | POA: Diagnosis not present

## 2016-03-01 DIAGNOSIS — R112 Nausea with vomiting, unspecified: Secondary | ICD-10-CM | POA: Diagnosis not present

## 2016-03-01 DIAGNOSIS — R109 Unspecified abdominal pain: Secondary | ICD-10-CM | POA: Diagnosis not present

## 2016-03-11 DIAGNOSIS — R3129 Other microscopic hematuria: Secondary | ICD-10-CM | POA: Diagnosis not present

## 2016-03-18 DIAGNOSIS — E1165 Type 2 diabetes mellitus with hyperglycemia: Secondary | ICD-10-CM | POA: Diagnosis not present

## 2016-03-18 DIAGNOSIS — D539 Nutritional anemia, unspecified: Secondary | ICD-10-CM | POA: Diagnosis not present

## 2016-03-18 DIAGNOSIS — E782 Mixed hyperlipidemia: Secondary | ICD-10-CM | POA: Diagnosis not present

## 2016-03-18 DIAGNOSIS — K219 Gastro-esophageal reflux disease without esophagitis: Secondary | ICD-10-CM | POA: Diagnosis not present

## 2016-03-18 DIAGNOSIS — R5383 Other fatigue: Secondary | ICD-10-CM | POA: Diagnosis not present

## 2016-03-18 DIAGNOSIS — Z79899 Other long term (current) drug therapy: Secondary | ICD-10-CM | POA: Diagnosis not present

## 2016-03-18 DIAGNOSIS — E559 Vitamin D deficiency, unspecified: Secondary | ICD-10-CM | POA: Diagnosis not present

## 2016-03-21 ENCOUNTER — Other Ambulatory Visit (HOSPITAL_COMMUNITY): Payer: Self-pay | Admitting: Psychiatry

## 2016-03-21 DIAGNOSIS — F331 Major depressive disorder, recurrent, moderate: Secondary | ICD-10-CM

## 2016-03-22 ENCOUNTER — Ambulatory Visit (HOSPITAL_COMMUNITY): Payer: Self-pay | Admitting: Psychiatry

## 2016-03-29 DIAGNOSIS — A048 Other specified bacterial intestinal infections: Secondary | ICD-10-CM | POA: Diagnosis not present

## 2016-03-29 DIAGNOSIS — Z01818 Encounter for other preprocedural examination: Secondary | ICD-10-CM | POA: Diagnosis not present

## 2016-03-29 DIAGNOSIS — K219 Gastro-esophageal reflux disease without esophagitis: Secondary | ICD-10-CM | POA: Diagnosis not present

## 2016-04-02 ENCOUNTER — Other Ambulatory Visit (HOSPITAL_COMMUNITY): Payer: Self-pay | Admitting: Psychiatry

## 2016-04-02 DIAGNOSIS — F331 Major depressive disorder, recurrent, moderate: Secondary | ICD-10-CM

## 2016-04-02 DIAGNOSIS — R3129 Other microscopic hematuria: Secondary | ICD-10-CM | POA: Diagnosis not present

## 2016-04-02 DIAGNOSIS — N359 Urethral stricture, unspecified: Secondary | ICD-10-CM | POA: Diagnosis not present

## 2016-04-04 DIAGNOSIS — K2970 Gastritis, unspecified, without bleeding: Secondary | ICD-10-CM | POA: Diagnosis not present

## 2016-04-04 DIAGNOSIS — A048 Other specified bacterial intestinal infections: Secondary | ICD-10-CM | POA: Diagnosis not present

## 2016-04-04 DIAGNOSIS — K5281 Eosinophilic gastritis or gastroenteritis: Secondary | ICD-10-CM | POA: Diagnosis not present

## 2016-04-09 DIAGNOSIS — Z1211 Encounter for screening for malignant neoplasm of colon: Secondary | ICD-10-CM | POA: Diagnosis not present

## 2016-04-09 DIAGNOSIS — E119 Type 2 diabetes mellitus without complications: Secondary | ICD-10-CM | POA: Diagnosis not present

## 2016-04-09 DIAGNOSIS — D509 Iron deficiency anemia, unspecified: Secondary | ICD-10-CM | POA: Diagnosis not present

## 2016-04-09 DIAGNOSIS — Z01818 Encounter for other preprocedural examination: Secondary | ICD-10-CM | POA: Diagnosis not present

## 2016-04-18 DIAGNOSIS — Z539 Procedure and treatment not carried out, unspecified reason: Secondary | ICD-10-CM | POA: Diagnosis not present

## 2016-04-18 DIAGNOSIS — Z1211 Encounter for screening for malignant neoplasm of colon: Secondary | ICD-10-CM | POA: Diagnosis not present

## 2016-04-18 DIAGNOSIS — M47816 Spondylosis without myelopathy or radiculopathy, lumbar region: Secondary | ICD-10-CM | POA: Diagnosis not present

## 2016-04-22 DIAGNOSIS — K219 Gastro-esophageal reflux disease without esophagitis: Secondary | ICD-10-CM | POA: Diagnosis not present

## 2016-04-22 DIAGNOSIS — Z1211 Encounter for screening for malignant neoplasm of colon: Secondary | ICD-10-CM | POA: Diagnosis not present

## 2016-04-22 DIAGNOSIS — K2270 Barrett's esophagus without dysplasia: Secondary | ICD-10-CM | POA: Diagnosis not present

## 2016-04-26 DIAGNOSIS — Z1231 Encounter for screening mammogram for malignant neoplasm of breast: Secondary | ICD-10-CM | POA: Diagnosis not present

## 2016-05-14 DIAGNOSIS — R3129 Other microscopic hematuria: Secondary | ICD-10-CM | POA: Diagnosis not present

## 2016-05-30 ENCOUNTER — Ambulatory Visit (HOSPITAL_COMMUNITY): Payer: Self-pay | Admitting: Psychiatry

## 2016-06-20 ENCOUNTER — Other Ambulatory Visit (HOSPITAL_COMMUNITY): Payer: Self-pay | Admitting: Psychiatry

## 2016-06-20 DIAGNOSIS — H5213 Myopia, bilateral: Secondary | ICD-10-CM | POA: Diagnosis not present

## 2016-06-20 DIAGNOSIS — H2513 Age-related nuclear cataract, bilateral: Secondary | ICD-10-CM | POA: Diagnosis not present

## 2016-06-20 DIAGNOSIS — H43813 Vitreous degeneration, bilateral: Secondary | ICD-10-CM | POA: Diagnosis not present

## 2016-06-20 DIAGNOSIS — F331 Major depressive disorder, recurrent, moderate: Secondary | ICD-10-CM

## 2016-06-20 DIAGNOSIS — H524 Presbyopia: Secondary | ICD-10-CM | POA: Diagnosis not present

## 2016-06-20 DIAGNOSIS — H25013 Cortical age-related cataract, bilateral: Secondary | ICD-10-CM | POA: Diagnosis not present

## 2016-06-20 DIAGNOSIS — H52203 Unspecified astigmatism, bilateral: Secondary | ICD-10-CM | POA: Diagnosis not present

## 2016-06-25 DIAGNOSIS — E1165 Type 2 diabetes mellitus with hyperglycemia: Secondary | ICD-10-CM | POA: Diagnosis not present

## 2016-06-25 DIAGNOSIS — E559 Vitamin D deficiency, unspecified: Secondary | ICD-10-CM | POA: Diagnosis not present

## 2016-06-25 DIAGNOSIS — Z79899 Other long term (current) drug therapy: Secondary | ICD-10-CM | POA: Diagnosis not present

## 2016-06-25 DIAGNOSIS — E538 Deficiency of other specified B group vitamins: Secondary | ICD-10-CM | POA: Diagnosis not present

## 2016-06-25 DIAGNOSIS — E7800 Pure hypercholesterolemia, unspecified: Secondary | ICD-10-CM | POA: Diagnosis not present

## 2016-06-25 DIAGNOSIS — R5383 Other fatigue: Secondary | ICD-10-CM | POA: Diagnosis not present

## 2016-07-01 ENCOUNTER — Other Ambulatory Visit (HOSPITAL_COMMUNITY): Payer: Self-pay

## 2016-07-01 ENCOUNTER — Other Ambulatory Visit (HOSPITAL_COMMUNITY): Payer: Self-pay | Admitting: Psychiatry

## 2016-07-01 DIAGNOSIS — F331 Major depressive disorder, recurrent, moderate: Secondary | ICD-10-CM

## 2016-07-01 MED ORDER — LAMOTRIGINE 25 MG PO TABS: 50.0000 mg | ORAL_TABLET | Freq: Every day | ORAL | 0 refills | Status: DC

## 2016-07-18 ENCOUNTER — Ambulatory Visit (INDEPENDENT_AMBULATORY_CARE_PROVIDER_SITE_OTHER): Payer: Medicare Other | Admitting: Psychiatry

## 2016-07-18 ENCOUNTER — Encounter (HOSPITAL_COMMUNITY): Payer: Self-pay | Admitting: Psychiatry

## 2016-07-18 VITALS — BP 118/68 | HR 68 | Ht 64.0 in | Wt 154.8 lb

## 2016-07-18 DIAGNOSIS — F331 Major depressive disorder, recurrent, moderate: Secondary | ICD-10-CM | POA: Diagnosis not present

## 2016-07-18 DIAGNOSIS — Z811 Family history of alcohol abuse and dependence: Secondary | ICD-10-CM

## 2016-07-18 DIAGNOSIS — Z9049 Acquired absence of other specified parts of digestive tract: Secondary | ICD-10-CM | POA: Diagnosis not present

## 2016-07-18 DIAGNOSIS — Z880 Allergy status to penicillin: Secondary | ICD-10-CM

## 2016-07-18 DIAGNOSIS — Z9884 Bariatric surgery status: Secondary | ICD-10-CM | POA: Diagnosis not present

## 2016-07-18 DIAGNOSIS — Z888 Allergy status to other drugs, medicaments and biological substances status: Secondary | ICD-10-CM

## 2016-07-18 DIAGNOSIS — Z818 Family history of other mental and behavioral disorders: Secondary | ICD-10-CM

## 2016-07-18 DIAGNOSIS — Z7982 Long term (current) use of aspirin: Secondary | ICD-10-CM

## 2016-07-18 DIAGNOSIS — Z79899 Other long term (current) drug therapy: Secondary | ICD-10-CM

## 2016-07-18 MED ORDER — VENLAFAXINE HCL ER 150 MG PO CP24: 150.0000 mg | ORAL_CAPSULE | Freq: Every day | ORAL | 2 refills | Status: DC

## 2016-07-18 MED ORDER — LAMOTRIGINE 25 MG PO TABS: 50.0000 mg | ORAL_TABLET | Freq: Every day | ORAL | 2 refills | Status: DC
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
## 2016-08-06 DIAGNOSIS — H2511 Age-related nuclear cataract, right eye: Secondary | ICD-10-CM | POA: Diagnosis not present

## 2016-08-06 DIAGNOSIS — H269 Unspecified cataract: Secondary | ICD-10-CM | POA: Diagnosis not present

## 2016-08-06 DIAGNOSIS — H25011 Cortical age-related cataract, right eye: Secondary | ICD-10-CM | POA: Diagnosis not present

## 2016-08-08 DIAGNOSIS — J069 Acute upper respiratory infection, unspecified: Secondary | ICD-10-CM | POA: Diagnosis not present

## 2016-08-15 DIAGNOSIS — R3129 Other microscopic hematuria: Secondary | ICD-10-CM | POA: Diagnosis not present

## 2016-08-15 DIAGNOSIS — R300 Dysuria: Secondary | ICD-10-CM | POA: Diagnosis not present

## 2016-08-19 DIAGNOSIS — R1319 Other dysphagia: Secondary | ICD-10-CM | POA: Insufficient documentation

## 2016-08-19 DIAGNOSIS — M21371 Foot drop, right foot: Secondary | ICD-10-CM | POA: Insufficient documentation

## 2016-08-23 DIAGNOSIS — H2512 Age-related nuclear cataract, left eye: Secondary | ICD-10-CM | POA: Diagnosis not present

## 2016-08-29 DIAGNOSIS — R1319 Other dysphagia: Secondary | ICD-10-CM | POA: Diagnosis not present

## 2016-09-05 DIAGNOSIS — R3129 Other microscopic hematuria: Secondary | ICD-10-CM | POA: Diagnosis not present

## 2016-10-02 DIAGNOSIS — H40013 Open angle with borderline findings, low risk, bilateral: Secondary | ICD-10-CM | POA: Diagnosis not present

## 2016-10-09 DIAGNOSIS — H2512 Age-related nuclear cataract, left eye: Secondary | ICD-10-CM | POA: Diagnosis not present

## 2016-10-09 DIAGNOSIS — H2589 Other age-related cataract: Secondary | ICD-10-CM | POA: Diagnosis not present

## 2016-10-09 DIAGNOSIS — H269 Unspecified cataract: Secondary | ICD-10-CM | POA: Diagnosis not present

## 2016-10-15 ENCOUNTER — Other Ambulatory Visit (HOSPITAL_COMMUNITY): Payer: Self-pay | Admitting: Psychiatry

## 2016-10-15 DIAGNOSIS — F331 Major depressive disorder, recurrent, moderate: Secondary | ICD-10-CM

## 2016-10-16 ENCOUNTER — Ambulatory Visit (HOSPITAL_COMMUNITY): Payer: Self-pay | Admitting: Psychiatry

## 2016-10-17 ENCOUNTER — Encounter (HOSPITAL_COMMUNITY): Payer: Self-pay | Admitting: Psychiatry

## 2016-10-17 ENCOUNTER — Ambulatory Visit (INDEPENDENT_AMBULATORY_CARE_PROVIDER_SITE_OTHER): Payer: Medicare Other | Admitting: Psychiatry

## 2016-10-17 DIAGNOSIS — F4310 Post-traumatic stress disorder, unspecified: Secondary | ICD-10-CM | POA: Diagnosis not present

## 2016-10-17 DIAGNOSIS — F331 Major depressive disorder, recurrent, moderate: Secondary | ICD-10-CM

## 2016-10-17 DIAGNOSIS — Z79899 Other long term (current) drug therapy: Secondary | ICD-10-CM

## 2016-10-17 DIAGNOSIS — Z7982 Long term (current) use of aspirin: Secondary | ICD-10-CM | POA: Diagnosis not present

## 2016-10-17 DIAGNOSIS — Z811 Family history of alcohol abuse and dependence: Secondary | ICD-10-CM | POA: Diagnosis not present

## 2016-10-17 DIAGNOSIS — Z818 Family history of other mental and behavioral disorders: Secondary | ICD-10-CM

## 2016-10-17 MED ORDER — VENLAFAXINE HCL ER 150 MG PO CP24: 150.0000 mg | ORAL_CAPSULE | Freq: Every day | ORAL | 2 refills | Status: DC

## 2016-10-17 MED ORDER — LAMOTRIGINE 25 MG PO TABS: 50.0000 mg | ORAL_TABLET | Freq: Every day | ORAL | 5 refills | Status: DC

## 2016-10-17 MED ORDER — VENLAFAXINE HCL ER 150 MG PO CP24: 150.0000 mg | ORAL_CAPSULE | Freq: Every day | ORAL | 5 refills | Status: DC

## 2016-10-17 MED ORDER — LAMOTRIGINE 25 MG PO TABS: 50.0000 mg | ORAL_TABLET | Freq: Every day | ORAL | 2 refills | Status: DC
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
## 2016-10-24 DIAGNOSIS — E538 Deficiency of other specified B group vitamins: Secondary | ICD-10-CM | POA: Diagnosis not present

## 2016-10-24 DIAGNOSIS — I6529 Occlusion and stenosis of unspecified carotid artery: Secondary | ICD-10-CM | POA: Diagnosis not present

## 2016-10-24 DIAGNOSIS — M79672 Pain in left foot: Secondary | ICD-10-CM | POA: Diagnosis not present

## 2016-10-24 DIAGNOSIS — E7800 Pure hypercholesterolemia, unspecified: Secondary | ICD-10-CM | POA: Diagnosis not present

## 2016-10-24 DIAGNOSIS — E1165 Type 2 diabetes mellitus with hyperglycemia: Secondary | ICD-10-CM | POA: Diagnosis not present

## 2016-10-24 DIAGNOSIS — E559 Vitamin D deficiency, unspecified: Secondary | ICD-10-CM | POA: Diagnosis not present

## 2016-10-24 DIAGNOSIS — E041 Nontoxic single thyroid nodule: Secondary | ICD-10-CM | POA: Diagnosis not present

## 2016-10-24 LAB — HEMOGLOBIN A1C: Hemoglobin A1C: 4.7

## 2016-10-24 LAB — HEPATIC FUNCTION PANEL
ALT: 22 (ref 7–35)
AST: 25 (ref 13–35)
Alkaline Phosphatase: 98 (ref 25–125)
BILIRUBIN, TOTAL: 0.5

## 2016-10-24 LAB — BASIC METABOLIC PANEL WITH GFR
BUN: 9 (ref 4–21)
Creatinine: 0.8 (ref 0.5–1.1)
Glucose: 126
Potassium: 4.4 (ref 3.4–5.3)
Sodium: 135 — AB (ref 137–147)

## 2016-10-24 LAB — VITAMIN B12: Vitamin B-12: 1652

## 2016-10-24 LAB — VITAMIN D 25 HYDROXY (VIT D DEFICIENCY, FRACTURES): Vit D, 25-Hydroxy: 24.45

## 2016-10-24 LAB — CBC AND DIFFERENTIAL
HCT: 38 (ref 36–46)
HEMOGLOBIN: 12.4 (ref 12.0–16.0)
WBC: 7.8

## 2016-10-24 LAB — TSH: TSH: 0.79 (ref 0.41–5.90)

## 2016-10-24 LAB — LIPID PANEL
Cholesterol: 239 — AB (ref 0–200)
HDL: 100 — AB (ref 35–70)
LDL Cholesterol: 125
Triglycerides: 66 (ref 40–160)

## 2016-10-25 DIAGNOSIS — S92302D Fracture of unspecified metatarsal bone(s), left foot, subsequent encounter for fracture with routine healing: Secondary | ICD-10-CM | POA: Diagnosis not present

## 2016-10-25 DIAGNOSIS — M79672 Pain in left foot: Secondary | ICD-10-CM | POA: Diagnosis not present

## 2016-11-08 DIAGNOSIS — R600 Localized edema: Secondary | ICD-10-CM | POA: Diagnosis not present

## 2016-11-08 DIAGNOSIS — S92302D Fracture of unspecified metatarsal bone(s), left foot, subsequent encounter for fracture with routine healing: Secondary | ICD-10-CM | POA: Diagnosis not present

## 2016-11-08 DIAGNOSIS — M79672 Pain in left foot: Secondary | ICD-10-CM | POA: Diagnosis not present

## 2016-11-19 DIAGNOSIS — I6529 Occlusion and stenosis of unspecified carotid artery: Secondary | ICD-10-CM | POA: Diagnosis not present

## 2016-11-19 DIAGNOSIS — E1165 Type 2 diabetes mellitus with hyperglycemia: Secondary | ICD-10-CM | POA: Diagnosis not present

## 2016-11-19 DIAGNOSIS — Z0000 Encounter for general adult medical examination without abnormal findings: Secondary | ICD-10-CM | POA: Diagnosis not present

## 2016-11-22 DIAGNOSIS — S92302D Fracture of unspecified metatarsal bone(s), left foot, subsequent encounter for fracture with routine healing: Secondary | ICD-10-CM | POA: Diagnosis not present

## 2016-11-22 DIAGNOSIS — M79672 Pain in left foot: Secondary | ICD-10-CM | POA: Diagnosis not present

## 2016-12-06 DIAGNOSIS — M79672 Pain in left foot: Secondary | ICD-10-CM | POA: Diagnosis not present

## 2016-12-06 DIAGNOSIS — S92302D Fracture of unspecified metatarsal bone(s), left foot, subsequent encounter for fracture with routine healing: Secondary | ICD-10-CM | POA: Diagnosis not present

## 2016-12-09 ENCOUNTER — Telehealth: Payer: Self-pay

## 2016-12-09 NOTE — Telephone Encounter (Signed)
Pre visit call completed. Patient refused to review medications over the phone states she will bring in.

## 2016-12-10 DIAGNOSIS — Z9181 History of falling: Secondary | ICD-10-CM | POA: Diagnosis not present

## 2016-12-10 DIAGNOSIS — G4761 Periodic limb movement disorder: Secondary | ICD-10-CM | POA: Diagnosis not present

## 2016-12-10 DIAGNOSIS — G250 Essential tremor: Secondary | ICD-10-CM | POA: Diagnosis not present

## 2016-12-10 DIAGNOSIS — M21371 Foot drop, right foot: Secondary | ICD-10-CM | POA: Diagnosis not present

## 2016-12-11 ENCOUNTER — Ambulatory Visit (INDEPENDENT_AMBULATORY_CARE_PROVIDER_SITE_OTHER): Payer: Medicare Other | Admitting: Family Medicine

## 2016-12-11 VITALS — BP 110/72 | HR 61 | Temp 97.9°F | Ht 64.0 in | Wt 150.2 lb

## 2016-12-11 DIAGNOSIS — Z9884 Bariatric surgery status: Secondary | ICD-10-CM

## 2016-12-11 DIAGNOSIS — I6521 Occlusion and stenosis of right carotid artery: Secondary | ICD-10-CM | POA: Diagnosis not present

## 2016-12-11 DIAGNOSIS — G250 Essential tremor: Secondary | ICD-10-CM | POA: Insufficient documentation

## 2016-12-11 DIAGNOSIS — F4310 Post-traumatic stress disorder, unspecified: Secondary | ICD-10-CM | POA: Diagnosis not present

## 2016-12-11 DIAGNOSIS — G2581 Restless legs syndrome: Secondary | ICD-10-CM

## 2016-12-11 DIAGNOSIS — F3340 Major depressive disorder, recurrent, in remission, unspecified: Secondary | ICD-10-CM | POA: Diagnosis not present

## 2016-12-11 DIAGNOSIS — Z8639 Personal history of other endocrine, nutritional and metabolic disease: Secondary | ICD-10-CM

## 2016-12-11 NOTE — Patient Instructions (Addendum)
It was a pleasure to see you today!  Let's meet in about 6 months and we will check your labs, etc Go ahead and see the vascular surgery clinic tomorrow- let me know if you have any concerns

## 2016-12-11 NOTE — Progress Notes (Signed)
[facility] at [facility] [address], [address] [place], [place] [address] [phone]  Date:  12/11/2016   Name:  Alexis [name]   DOB:  1945/07/15   MRN:  [id number]  PCP:  Pearline Cables, MD    Chief Complaint: Establish Care (Pt here to est care. )   History of Present Illness:  Alexis [name] is a 71 y.o. very pleasant female patient who presents with the following:  Her SIL is my patient so she is coming here to establish care with [place] as well.   She has lived in this area for 4-5 years, moved from [place] to be closer to her daughter and SIl, and her grandchildren.  They have 2 grandkids.    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 She hopes that she will be released to regular activity at her ortho appt coming up next week  She had her bilateral catarats removed over the last 6 months and is doing great in this respect   She is not on any diabetes meds- she had diabetes when she was heavy but is now essentially cured Her most recent A1c was 4.7 per her report  Her last doctor has been following her for right carotid artery stenosis.   It appears that her right carotid has more significant stenosis at last check, and she has been referred to vascular surgery for a consultation  She does take gabapentin for her RLS- this does seem to help some She sees Dr. Lolly Mustache for her psychiatric care.  He is treating her with effexor and lamictal and her mdd/ ptsd is under good control.   Also history of familiar tremor   Reviewed records in epic today  Patient Active Problem List   Diagnosis Date Noted  . Restless legs 12/11/2016  . History of gastric bypass  12/11/2016  . PTSD (post-traumatic stress disorder) 12/11/2016  . 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 Anemia   . Back pain   . Diabetes mellitus, type II (HCC)   . GERD (gastroesophageal reflux disease)   . Hyperlipidemia   . Memory deficits   . Thyroid disease     Past Surgical History:  Procedure Laterality Date  . CHOLECYSTECTOMY    . GASTRIC BYPASS    . TONSILLECTOMY      Social History  Substance Use Topics  . Smoking status: Never Smoker  . Smokeless tobacco: Never Used  . Alcohol use No    Family History  Problem Relation Age of Onset  . Depression Mother   . Alcohol abuse Father   . Tremor Father     Allergies  Allergen Reactions  . Metformin   . Penicillins     Other reaction(s): HIVES    Medication list has been reviewed and updated.  Current Outpatient Prescriptions on File Prior to Visit  Medication Sig Dispense Refill  . aspirin (ASPIRIN EC) 81 MG EC tablet Take 81 mg by mouth at bedtime. Swallow whole.    . b complex vitamins tablet Take 1 tablet by mouth 2 (two) times  daily.    . Cyanocobalamin (VITAMIN B12 TR PO) Take 500 mcg by mouth at bedtime.     . gabapentin (NEURONTIN) 300 MG capsule Take 300 mg by mouth at bedtime.    . lamoTRIgine (LAMICTAL) 25 MG tablet Take 2 tablets (50 mg total) by mouth daily. Must see physician for future refills. 60 tablet 5  . loperamide (IMODIUM A-D) 2 MG tablet Take 2 mg by mouth as needed.     [name]. omeprazole (PRILOSEC) 40 MG capsule Take 40 mg by mouth 2 (two) times daily.    . primidone (MYSOLINE) 50 MG tablet Take by mouth 3 (three) times daily.    . Simethicone (GAS RELIEF) 125 MG CAPS Take 125 mg by mouth daily.    [name]. venlafaxine XR (EFFEXOR-XR) 150 MG 24 hr capsule Take 1 capsule (150 mg total) by mouth daily. 30 capsule 5   No current facility-administered medications on file prior to visit.     Review of Systems:  As per HPI- otherwise negative.  No  fever, chills, nausea, vomiting, CP, SOB, ST, cough    Physical Examination: Vitals:   12/11/16 1334  BP: 110/72  Pulse: 61  Temp: 97.9 F (36.6 C)   Vitals:   12/11/16 1334  Weight: 150 lb 3.2 oz (68.1 kg)  Height: 5\' 4"  (1.626 m)   Body mass index is 25.78 kg/m. Ideal Body Weight: Weight in (lb) to have BMI = 25: 145.3  GEN: WDWN, NAD, Non-toxic, A & O x 3 HEENT: Atraumatic, Normocephalic. Neck supple. No masses, No LAD. Ears and Nose: No external deformity. CV: RRR, No M/G/R. No JVD. No thrill. No extra heart sounds. PULM: CTA B, no wheezes, crackles, rhonchi. No retractions. No resp. distress. No accessory muscle use. EXTR: No c/c/e NEURO Normal gait.  PSYCH: Normally interactive. Conversant. Not depressed or anxious appearing.  Calm demeanor.  Normal weight, looks well.  Has some repetitive jaw clenching which is baseline per recent psychiatric note   Assessment and Plan: Restless legs  History of gastric bypass  Stenosis of right carotid artery  History of diabetes mellitus  Recurrent major depressive disorder, in remission (HCC)  PTSD (post-traumatic stress disorder)  Tremor, hereditary, benign  Here today to establish care. She bring in recent labs from her last PCP which we will abstract into chart  History of DM but this is no longer an issue thanks to her 100 lb weight loss  She is seeing vascular surgery tomorrow for an opinion- she will let me know if any concerns Plan to visit in about 6 months   Signed Abbe AmsterdamJessica Haji Delaine, MD

## 2016-12-12 DIAGNOSIS — I6523 Occlusion and stenosis of bilateral carotid arteries: Secondary | ICD-10-CM | POA: Insufficient documentation

## 2016-12-20 DIAGNOSIS — M79672 Pain in left foot: Secondary | ICD-10-CM | POA: Diagnosis not present

## 2016-12-20 DIAGNOSIS — S92302D Fracture of unspecified metatarsal bone(s), left foot, subsequent encounter for fracture with routine healing: Secondary | ICD-10-CM | POA: Diagnosis not present

## 2016-12-26 ENCOUNTER — Encounter: Payer: Self-pay | Admitting: Family Medicine

## 2016-12-26 NOTE — Progress Notes (Unsigned)
CL: 100 mEq/L CO2: 28.0 mmol/L TPROT: 6.6 g/dL ALB: 4.4 CA: 8.8 mg/dL GFR: 16.1075.11 RU/EAV/4.09mL/min/1.73 MCV: 96 fL PLT: 194 K/uL iPTH xp: 93.00 (abn) pg/mL  T3 Total: 72.71 ng/dL  Magnesium: 8.112.20 mg/dL Free T4: 9.140.87 (abn) ng/dL

## 2017-01-02 DIAGNOSIS — M79672 Pain in left foot: Secondary | ICD-10-CM | POA: Diagnosis not present

## 2017-01-02 DIAGNOSIS — S92302D Fracture of unspecified metatarsal bone(s), left foot, subsequent encounter for fracture with routine healing: Secondary | ICD-10-CM | POA: Diagnosis not present

## 2017-01-16 DIAGNOSIS — S92302D Fracture of unspecified metatarsal bone(s), left foot, subsequent encounter for fracture with routine healing: Secondary | ICD-10-CM | POA: Diagnosis not present

## 2017-01-16 DIAGNOSIS — M79672 Pain in left foot: Secondary | ICD-10-CM | POA: Diagnosis not present

## 2017-01-23 DIAGNOSIS — S92302D Fracture of unspecified metatarsal bone(s), left foot, subsequent encounter for fracture with routine healing: Secondary | ICD-10-CM | POA: Diagnosis not present

## 2017-01-28 DIAGNOSIS — K629 Disease of anus and rectum, unspecified: Secondary | ICD-10-CM | POA: Diagnosis not present

## 2017-01-28 DIAGNOSIS — L404 Guttate psoriasis: Secondary | ICD-10-CM | POA: Diagnosis not present

## 2017-01-28 DIAGNOSIS — N763 Subacute and chronic vulvitis: Secondary | ICD-10-CM | POA: Diagnosis not present

## 2017-01-31 DIAGNOSIS — K629 Disease of anus and rectum, unspecified: Secondary | ICD-10-CM | POA: Insufficient documentation

## 2017-02-10 DIAGNOSIS — Z23 Encounter for immunization: Secondary | ICD-10-CM | POA: Diagnosis not present

## 2017-02-18 DIAGNOSIS — S92302D Fracture of unspecified metatarsal bone(s), left foot, subsequent encounter for fracture with routine healing: Secondary | ICD-10-CM | POA: Diagnosis not present

## 2017-02-18 DIAGNOSIS — M79672 Pain in left foot: Secondary | ICD-10-CM | POA: Diagnosis not present

## 2017-02-20 DIAGNOSIS — N763 Subacute and chronic vulvitis: Secondary | ICD-10-CM | POA: Diagnosis not present

## 2017-02-20 DIAGNOSIS — L280 Lichen simplex chronicus: Secondary | ICD-10-CM | POA: Diagnosis not present

## 2017-03-05 ENCOUNTER — Telehealth: Payer: Self-pay | Admitting: Family Medicine

## 2017-03-05 DIAGNOSIS — E871 Hypo-osmolality and hyponatremia: Secondary | ICD-10-CM | POA: Diagnosis not present

## 2017-03-05 DIAGNOSIS — R0789 Other chest pain: Secondary | ICD-10-CM | POA: Diagnosis not present

## 2017-03-05 DIAGNOSIS — E785 Hyperlipidemia, unspecified: Secondary | ICD-10-CM | POA: Diagnosis not present

## 2017-03-05 DIAGNOSIS — R109 Unspecified abdominal pain: Secondary | ICD-10-CM | POA: Diagnosis not present

## 2017-03-05 DIAGNOSIS — R61 Generalized hyperhidrosis: Secondary | ICD-10-CM | POA: Diagnosis not present

## 2017-03-05 DIAGNOSIS — E119 Type 2 diabetes mellitus without complications: Secondary | ICD-10-CM | POA: Diagnosis not present

## 2017-03-05 DIAGNOSIS — I6523 Occlusion and stenosis of bilateral carotid arteries: Secondary | ICD-10-CM | POA: Diagnosis not present

## 2017-03-05 DIAGNOSIS — L280 Lichen simplex chronicus: Secondary | ICD-10-CM | POA: Insufficient documentation

## 2017-03-05 DIAGNOSIS — R072 Precordial pain: Secondary | ICD-10-CM | POA: Diagnosis not present

## 2017-03-05 DIAGNOSIS — J90 Pleural effusion, not elsewhere classified: Secondary | ICD-10-CM | POA: Diagnosis not present

## 2017-03-05 DIAGNOSIS — I313 Pericardial effusion (noninflammatory): Secondary | ICD-10-CM | POA: Diagnosis not present

## 2017-03-05 DIAGNOSIS — M6281 Muscle weakness (generalized): Secondary | ICD-10-CM | POA: Diagnosis not present

## 2017-03-05 DIAGNOSIS — Z7902 Long term (current) use of antithrombotics/antiplatelets: Secondary | ICD-10-CM | POA: Diagnosis not present

## 2017-03-05 DIAGNOSIS — Z7982 Long term (current) use of aspirin: Secondary | ICD-10-CM | POA: Diagnosis not present

## 2017-03-05 DIAGNOSIS — R079 Chest pain, unspecified: Secondary | ICD-10-CM | POA: Diagnosis not present

## 2017-03-05 NOTE — Telephone Encounter (Signed)
[facility] Day - Client TELEPHONE ADVICE RECORD [facility]  Patient Name: Alexis [name]  DOB: Mar 07, 1946    Initial Comment Caller states that she is very weak and sweating a lot.    Nurse Assessment  Nurse: Earlene Plater RN, Lupita Leash Date/Time [name] Time): 03/05/2017 12:26:49 PM  Confirm and document reason for call. If symptomatic, describe symptoms. ---Caller states that she is very weak and sweating a lot. Sweating and weakness on exertion. No chest pain. Upper body swelling. Bra is tight and needs to rub. Started 5 days ago. Cannot perform daily activities. Does state her chest is tight after taking bra off.  Does the patient have any new or worsening symptoms? ---Yes  Will a triage be completed? ---Yes  Related visit to physician within the last 2 weeks? ---Yes  Does the PT have any chronic conditions? (i.e. diabetes, asthma, etc.) ---Yes  List chronic conditions. ---Diabetes Cardiac Cath-negative.  Is this a behavioral health or substance abuse call? ---No     Guidelines    Guideline Title Affirmed Question Affirmed Notes  Weakness (Generalized) and Fatigue Difficulty breathing    Final Disposition User   Go to ED Now Earlene Plater, RN, Lupita Leash    Referrals  [facility] - ED  [facility] - ED   Disagree/Comply: Comply

## 2017-03-06 DIAGNOSIS — I6523 Occlusion and stenosis of bilateral carotid arteries: Secondary | ICD-10-CM | POA: Diagnosis not present

## 2017-03-06 DIAGNOSIS — Z7982 Long term (current) use of aspirin: Secondary | ICD-10-CM | POA: Diagnosis not present

## 2017-03-06 DIAGNOSIS — Z7902 Long term (current) use of antithrombotics/antiplatelets: Secondary | ICD-10-CM | POA: Diagnosis not present

## 2017-03-06 DIAGNOSIS — E871 Hypo-osmolality and hyponatremia: Secondary | ICD-10-CM | POA: Diagnosis not present

## 2017-03-06 DIAGNOSIS — E119 Type 2 diabetes mellitus without complications: Secondary | ICD-10-CM | POA: Diagnosis not present

## 2017-03-06 DIAGNOSIS — R079 Chest pain, unspecified: Secondary | ICD-10-CM | POA: Diagnosis not present

## 2017-03-07 ENCOUNTER — Telehealth (HOSPITAL_COMMUNITY): Payer: Self-pay

## 2017-03-07 ENCOUNTER — Other Ambulatory Visit (HOSPITAL_COMMUNITY): Payer: Self-pay | Admitting: Psychiatry

## 2017-03-07 DIAGNOSIS — F331 Major depressive disorder, recurrent, moderate: Secondary | ICD-10-CM

## 2017-03-07 NOTE — Telephone Encounter (Signed)
Medication refill - Fax from patient's [facility] on [address] [place] requesting a new 90 day order for patient's prescribed Venlafaxine ER 150 mg. Next appt. 04/21/17.  Currently gets 30 day orders from 10/17/16.

## 2017-03-07 NOTE — Telephone Encounter (Signed)
She had enough refill until her next appointment.  We will discuss 90 day option on her next appointment.

## 2017-03-12 ENCOUNTER — Ambulatory Visit (INDEPENDENT_AMBULATORY_CARE_PROVIDER_SITE_OTHER): Payer: Medicare Other | Admitting: Family Medicine

## 2017-03-12 ENCOUNTER — Encounter: Payer: Self-pay | Admitting: Family Medicine

## 2017-03-12 VITALS — BP 110/70 | HR 67 | Temp 98.3°F | Ht 60.0 in | Wt 145.0 lb

## 2017-03-12 DIAGNOSIS — M25532 Pain in left wrist: Secondary | ICD-10-CM

## 2017-03-12 DIAGNOSIS — I6521 Occlusion and stenosis of right carotid artery: Secondary | ICD-10-CM

## 2017-03-12 DIAGNOSIS — R0789 Other chest pain: Secondary | ICD-10-CM | POA: Diagnosis not present

## 2017-03-12 NOTE — Patient Instructions (Addendum)
It was good to see you again today!  We will re-request your records from [facility] I am going to refer you to [facility] to see a hand specialist.   Please let me know if you need anything else, and we can plan to visit in about 6 months otherwise

## 2017-03-12 NOTE — Progress Notes (Signed)
[facility] at [facility] [address] [place], [place] [address] [phone]  Date:  03/12/2017   Name:  Alexis [name]   DOB:  1946-05-29   MRN:  [id number]  PCP:  Pearline Cables, MD    Chief Complaint: Hospitalization Follow-up   History of Present Illness:  Alexis [name] is a 71 y.o. very pleasant female patient who presents with the following:  History of DM, hyperlipidemia, anxiety/ PTSD here today for a hospital follow-up visit.  She was recently admitted overnight for a CP rule- out: Admit date: 03/05/2017 Discharge date and time: March 06, 2017 1415  Admission Diagnoses: Chest pain  Discharge Diagnoses:  Principal Problem (Resolved): Chest pain Active Problems: Bilateral carotid artery stenosis Type 2 diabetes mellitus without complication, without long-term current use of insulin (HCC) Hyponatremia  Admission Condition: fair Discharged Condition: good  Indication for Admission:   Hospital Course:   Chest pain patient was admitted to telemetry troponins were negative underwent a Lexiscan stress test which was negative. Patient stable without chest pain will follow up with her primary care physician. Patient is also treated for carotid artery stenosis by continue her aspirin and Plavix will follow up with her vascular surgeon as an outpatient. Hyponatremia is also treated with gentle resuscitation problem resolved. Patient was discharged home with outpatient follow-up as described  Patient's Ordered Code Status: Full Code  Goals of Care: Follow-up with PCP and vascular surgeon as discussed.  Her effexor was stopped during this admission- her psychiatrist is Dr. Lolly Mustache  I saw her as a new patient in June of this year:  She enjoys going to [facility] for weight management and she lost about 100 lbs over the years She enjoys church and walking for exercise She does have a stress fracture in her left foot and  has been in a boot for 8 weeks. She hopes that she will be released to regular activity at her ortho appt coming up next week  She had her bilateral catarats removed over the last 6 months and is doing great in this respect   She is not on any diabetes meds- she had diabetes when she was heavy but is now essentially cured Her most recent A1c was 4.7 per her report  Her last doctor has been following her for right carotid artery stenosis.   It appears that her right carotid has more significant stenosis at last check, and she has been referred to vascular surgery for a consultation  She does take gabapentin for her RLS- this does seem to help some She sees Dr. Lolly Mustache for her psychiatric care.  He is treating her with effexor and lamictal and her mdd/ ptsd is under good control.   Also history of familiar tremor   Lab Results  Component Value Date   HGBA1C 4.7 10/24/2016   Pt reports that she did not have chest pain, but chest pressure She notes that she is now feeling pretty good, the chest pressure has resolved She does notice a mild cough, and feels like she might choke when she coughs She has noted some irritation/ cough with swallowing but not anything getting stuck This has been the case for a couple of months She does have "indigestion" at times  She got weighed at [facility] today-  her weight was 145 at home this morning   His vascular surgeon is Dr. Randye Lobo with [facility].  They do an [place] of her carotids every  6 months, and are continuing to observe for the time being.  They will not operate until it comes to the point when this is necessary  She has history of ganglion cyst on the right thumb-  mammo done last year- she will have this done this fall Colonoscopy this year- 6-8 months ago She did have a dexa scan; osteopenia.  This was done about 1 year ago  She is taking omeprazole 20 BID per her GI doctor, Dr. Noe Gens with [facility] has been her GI doctor  She did have an  operation for a left wrist ganglion cyst in the past but she would like to see hand surgery for what seems to be a recurrence  She did see her GYN this year for her usual well woman care  History of gastric bypass, she is quite vigilant about not gaining weight   Patient Active Problem List   Diagnosis Date Noted  . Restless legs 12/11/2016  . History of gastric bypass 12/11/2016  . PTSD (post-traumatic stress disorder) 12/11/2016  . 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 Anemia   . Back pain   . Diabetes mellitus, type II (HCC)   . GERD (gastroesophageal reflux disease)   . Hyperlipidemia   . Memory deficits   . Thyroid disease     Past Surgical History:  Procedure Laterality Date  . CHOLECYSTECTOMY    . GASTRIC BYPASS    . TONSILLECTOMY      Social History  Substance Use Topics  . Smoking status: Never Smoker  . Smokeless tobacco: Never Used  . Alcohol use No    Family History  Problem Relation Age of Onset  . Depression Mother   . Alcohol abuse Father   . Tremor Father     Allergies  Allergen Reactions  . Metformin   . Penicillins     Other reaction(s): HIVES    Medication list has been reviewed and updated.  Current Outpatient Prescriptions on File Prior to Visit  Medication Sig Dispense Refill  . aspirin (ASPIRIN EC) 81 MG EC tablet Take 81 mg by mouth at bedtime. Swallow whole.    . b complex vitamins tablet Take 1 tablet by mouth 2 (two) times daily.    [name] BIOTIN 5000 PO Take 1 tablet by mouth 2 (two) times daily.    . calcium carbonate (CALCIUM 600) 600 MG TABS tablet Take 1 tablet by mouth daily.    . Cyanocobalamin (VITAMIN B12 TR PO) Take 500 mcg by mouth at bedtime.     . diphenhydrAMINE (BENADRYL) 25 mg capsule Take 25 mg by mouth 3 (three) times daily.    [name] gabapentin (NEURONTIN) 300 MG capsule Take 300 mg by mouth at bedtime.    . lamoTRIgine (LAMICTAL) 25 MG tablet Take 2 tablets (50 mg total) by  mouth daily. Must see physician for future refills. 60 tablet 5  . loperamide (IMODIUM A-D) 2 MG tablet Take 2 mg by mouth as needed.     . metoCLOPramide (REGLAN) 10 MG tablet Take 10 mg by mouth 2 (two) times daily.    [name] omeprazole (PRILOSEC) 40 MG capsule Take 40 mg by mouth 2 (two) times daily.    . primidone (MYSOLINE) 50 MG tablet Take by mouth 3 (three) times daily.    . Simethicone (GAS RELIEF) 125 MG CAPS Take 125 mg by mouth daily.    [name] venlafaxine XR (EFFEXOR-XR) 150 MG 24 hr capsule Take  1 capsule (150 mg total) by mouth daily. 30 capsule 5   No current facility-administered medications on file prior to visit.     Review of Systems:  As per HPI- otherwise negative. No fever or chills No CP or SOB since she went home from the hospital No nausea, vomiting or diarrhea No rash No sore throat   Physical Examination: Vitals:   03/12/17 1150  BP: 128/66  Pulse: 67  Temp: 98.3 F (36.8 C)  SpO2: 98%   Vitals:   03/12/17 1150  Weight: 145 lb (65.8 kg)  Height: 5' (1.524 m)   Body mass index is 28.32 kg/m. Ideal Body Weight: Weight in (lb) to have BMI = 25: 127.7  GEN: WDWN, NAD, Non-toxic, A & O x 3, mild overweight, looks well HEENT: Atraumatic, Normocephalic. Neck supple. No masses, No LAD.  Bilateral TM wnl, oropharynx normal.  PEERL,EOMI.   Ears and Nose: No external deformity. CV: RRR, No M/G/R. No JVD. No thrill. No extra heart sounds. PULM: CTA B, no wheezes, crackles, rhonchi. No retractions. No resp. distress. No accessory muscle use. ABD: S, NT, ND EXTR: No c/c/e NEURO Normal gait but she is still wearing her fracture boot PSYCH: Normally interactive. Conversant. Not depressed or anxious appearing.  Calm demeanor.  Left wrist: tenderness along the thumb extensors and radial aspect of the wrist joint    Assessment and Plan: Left wrist pain - Plan: Ambulatory referral to Hand Surgery  Chest pressure  Recent hospital stay and rule out for CP- normal  stress test  She will continue to follow-up with her vascular surgeon about her carotid stenosis She has had left wrist surgery for a ganglion cyst in the past- she notes that a tender cyst seems to be coming back.  Desires referral to orthopedics I still have not gotten her past records from [place]- will request these again today   Signed Abbe Amsterdam, MD

## 2017-03-18 DIAGNOSIS — S92302D Fracture of unspecified metatarsal bone(s), left foot, subsequent encounter for fracture with routine healing: Secondary | ICD-10-CM | POA: Diagnosis not present

## 2017-03-18 DIAGNOSIS — M79672 Pain in left foot: Secondary | ICD-10-CM | POA: Diagnosis not present

## 2017-03-27 ENCOUNTER — Other Ambulatory Visit (HOSPITAL_COMMUNITY): Payer: Self-pay

## 2017-03-27 DIAGNOSIS — F331 Major depressive disorder, recurrent, moderate: Secondary | ICD-10-CM

## 2017-03-27 MED ORDER — LAMOTRIGINE 25 MG PO TABS: 50.0000 mg | ORAL_TABLET | Freq: Every day | ORAL | 0 refills | Status: DC

## 2017-04-21 ENCOUNTER — Encounter (HOSPITAL_COMMUNITY): Payer: Self-pay | Admitting: Psychiatry

## 2017-04-21 ENCOUNTER — Ambulatory Visit (INDEPENDENT_AMBULATORY_CARE_PROVIDER_SITE_OTHER): Payer: Medicare Other | Admitting: Psychiatry

## 2017-04-21 DIAGNOSIS — F4310 Post-traumatic stress disorder, unspecified: Secondary | ICD-10-CM

## 2017-04-21 DIAGNOSIS — I6521 Occlusion and stenosis of right carotid artery: Secondary | ICD-10-CM

## 2017-04-21 DIAGNOSIS — Z91410 Personal history of adult physical and sexual abuse: Secondary | ICD-10-CM | POA: Diagnosis not present

## 2017-04-21 DIAGNOSIS — F331 Major depressive disorder, recurrent, moderate: Secondary | ICD-10-CM

## 2017-04-21 DIAGNOSIS — Z818 Family history of other mental and behavioral disorders: Secondary | ICD-10-CM

## 2017-04-21 DIAGNOSIS — Z811 Family history of alcohol abuse and dependence: Secondary | ICD-10-CM | POA: Diagnosis not present

## 2017-04-21 MED ORDER — VENLAFAXINE HCL ER 150 MG PO CP24: 150.0000 mg | ORAL_CAPSULE | Freq: Every day | ORAL | 1 refills | Status: DC

## 2017-04-21 MED ORDER — LAMOTRIGINE 25 MG PO TABS: 50.0000 mg | ORAL_TABLET | Freq: Every day | ORAL | 1 refills | Status: DC
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
## 2017-05-19 DIAGNOSIS — S92302D Fracture of unspecified metatarsal bone(s), left foot, subsequent encounter for fracture with routine healing: Secondary | ICD-10-CM | POA: Diagnosis not present

## 2017-05-19 DIAGNOSIS — M79672 Pain in left foot: Secondary | ICD-10-CM | POA: Diagnosis not present

## 2017-05-27 NOTE — Progress Notes (Deleted)
[facility] [address], [address] [place], [place] [address] [phone] [phone]  Date:  05/29/2017   Name:  Alexis BoringSandra [name]   DOB:  01-06-1946   MRN:  [id number]  PCP:  Pearline Cablesopland, Alexandra Posadas C, MD    Chief Complaint: No chief complaint on file.   History of Present Illness:  Alexis [name] is a 71 y.o. very pleasant female patient who presents with the following:  Follow-up visit today History of PTSD, gastric bypass surgery, RLS Last seen here in September following an incident of CP, previously I had seen here in June:  Her SIL is my patient so she is coming here to establish care with us as well.   She has lived in this area for 4-5 years, moved from [place] to be closer to her daughter and SIl, and her grandchildren.  They have 2 grandkids.   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 She hopes that she will be released to regular activity at her ortho appt coming up next week She had her bilateral catarats removed over the last 6 months and is doing great in this respect  She is not on any diabetes meds- she had diabetes when she was heavy but is now essentially cured Her most recent A1c was 4.7 per her report Her last doctor has been following her for right carotid artery stenosis.   It appears that her right carotid has more significant stenosis at last check, and she has been referred to vascular surgery for a consultation She does take gabapentin for her RLS- this does seem to help some She sees Dr. Lolly MustacheArfeen for her psychiatric care.  He is treating her with effexor and lamictal and her mdd/ ptsd is under good control.   Also history of familiar tremor     Need to go over her health maint checklist today Flu: Labs done in May  Patient Active Problem List   Diagnosis Date Noted  . Restless legs 12/11/2016  . History of gastric bypass 12/11/2016  . PTSD (post-traumatic stress disorder) 12/11/2016  . 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 Anemia   . Back pain   . Diabetes mellitus, type II (HCC)   . GERD (gastroesophageal reflux disease)   . Hyperlipidemia   . Memory deficits   . PTSD (post-traumatic stress disorder)   . Thyroid disease     Past Surgical History:  Procedure Laterality Date  . CHOLECYSTECTOMY    . GASTRIC BYPASS    . TONSILLECTOMY      Social History   Tobacco Use  . Smoking status: Never Smoker  . Smokeless tobacco: Never Used  Substance Use Topics  . Alcohol use: No    Alcohol/week: 0.0 oz  . Drug use: No    Family History  Problem Relation Age of Onset  . Depression Mother   . Alcohol abuse Father   . Tremor Father     Allergies  Allergen Reactions  . Metformin   . Penicillins     Other reaction(s): HIVES    Medication list has been  reviewed and updated.  Current Outpatient Medications on File Prior to Visit  Medication Sig Dispense Refill  . aspirin (ASPIRIN EC) 81 MG EC tablet Take 81 mg by mouth at bedtime. Swallow whole.    . b complex vitamins tablet Take 1 tablet by mouth 2 (two) times daily.    [name]. BIOTIN 5000 PO Take 1 tablet by mouth 2 (two) times daily.    . calcium carbonate (CALCIUM 600) 600 MG TABS tablet Take 1 tablet by mouth daily.    . Cyanocobalamin (VITAMIN B12 TR PO) Take 500 mcg by mouth at bedtime.     . diphenhydrAMINE (BENADRYL) 25 mg capsule Take 25 mg by mouth 3 (three) times daily.    [name]. gabapentin (NEURONTIN) 300 MG capsule Take 300 mg by mouth at bedtime.    . lamoTRIgine (LAMICTAL) 25 MG tablet Take 2 tablets (50 mg total) daily by mouth. Must see physician for future refills. 180 tablet 1  . loperamide  (IMODIUM A-D) 2 MG tablet Take 2 mg by mouth as needed.     . metoCLOPramide (REGLAN) 10 MG tablet Take 10 mg by mouth 2 (two) times daily.    . nitroGLYCERIN (NITROSTAT) 0.3 MG SL tablet Place 0.3 mg under the tongue every 5 (five) minutes as needed for chest pain.    [name]. omeprazole (PRILOSEC) 40 MG capsule Take 40 mg by mouth 2 (two) times daily.    . primidone (MYSOLINE) 50 MG tablet Take by mouth 3 (three) times daily.    . Simethicone (GAS RELIEF) 125 MG CAPS Take 125 mg by mouth daily.    [name]. venlafaxine XR (EFFEXOR-XR) 150 MG 24 hr capsule Take 1 capsule (150 mg total) daily by mouth. 90 capsule 1   No current facility-administered medications on file prior to visit.     Review of Systems:  As per HPI- otherwise negative.   Physical Examination: There were no vitals filed for this visit. There were no vitals filed for this visit. There is no height or weight on file to calculate BMI. Ideal Body Weight:    GEN: WDWN, NAD, Non-toxic, A & O x 3 HEENT: Atraumatic, Normocephalic. Neck supple. No masses, No LAD. Ears and Nose: No external deformity. CV: RRR, No M/G/R. No JVD. No thrill. No extra heart sounds. PULM: CTA B, no wheezes, crackles, rhonchi. No retractions. No resp. distress. No accessory muscle use. ABD: S, NT, ND, +BS. No rebound. No HSM. EXTR: No c/c/e NEURO Normal gait.  PSYCH: Normally interactive. Conversant. Not depressed or anxious appearing.  Calm demeanor.    Assessment and Plan: ***  Signed Abbe AmsterdamJessica Omarrion Carmer, MD

## 2017-05-29 ENCOUNTER — Ambulatory Visit: Payer: Self-pay | Admitting: Family Medicine

## 2017-06-01 NOTE — Progress Notes (Addendum)
[facility] [address], [address] [place], [place] [address] [phone] [phone]  Date:  06/02/2017   Name:  Alexis [name]   DOB:  1945/10/31   MRN:  [id number]  PCP:  Alexis Cablesopland, Jessica C, MD    Chief Complaint: No chief complaint on file.   History of Present Illness:  Alexis BoringSandra [name] is a 71 y.o. very pleasant female patient who presents with the following:  Here today for a 6 month follow-up visit From our last routine visit in June:  Her SIL is my patient so she is coming here to establish care with us as well.   She has lived in this area for 4-5 years, moved from [place] to be closer to her daughter and SIL, and her grandchildren.  They have 2 grandkids.    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 She hopes that she will be released to regular activity at her ortho appt coming up next week  She had her bilateral catarats removed over the last 6 months and is doing great in this respect  She is not on any diabetes meds- she had diabetes when she was heavy but is now essentially cured Her most recent A1c was 4.7 per her report Her last doctor has been following her for right carotid artery stenosis.   It appears that her right carotid has more significant stenosis at last check, and she has been referred to vascular surgery for a consultation She does take gabapentin for her RLS- this does seem to help some She sees Dr. Lolly MustacheArfeen for her psychiatric care.  He is treating her with effexor and lamictal and her mdd/ ptsd is under good control.   Also history of familiar tremor   I also did see her in [facility]- all looked ok with her heart.  No further issues here Flu: this was done earlier this year  Labs: will repeat today  She is overall doing well She reports that [place]. Noe [facility]Peters [facility] sent her a letter saying that she needs endoscopy- however she is not sure if this is needed and does not want to go back to see him She needs a referral to GI  She also was seeing a neurologist for a tremor, "my shakes"- her last doctor left her practice and she needs a new provder Will refer to neurology as well  Patient Active Problem List   Diagnosis Date Noted  . Restless legs 12/11/2016  . History of gastric bypass 12/11/2016  . PTSD (post-traumatic stress disorder) 12/11/2016  . 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 Anemia   . Back pain   . Diabetes mellitus, type II (HCC)   . GERD (gastroesophageal reflux disease)   . Hyperlipidemia   . Memory deficits   . PTSD (post-traumatic stress disorder)   . Thyroid disease     Past Surgical History:  Procedure Laterality Date  . CHOLECYSTECTOMY    . GASTRIC BYPASS    . TONSILLECTOMY  Social History   Tobacco Use  . Smoking status: Never Smoker  . Smokeless tobacco: Never Used  Substance Use Topics  . Alcohol use: No    Alcohol/week: 0.0 oz  . Drug use: No    Family History  Problem Relation Age of Onset  . Depression Mother   . Alcohol abuse Father   . Tremor Father     Allergies  Allergen Reactions  . Metformin   . Penicillins     Other reaction(s): HIVES    Medication list has been reviewed and updated.  Current Outpatient Medications on File Prior to Visit  Medication Sig Dispense Refill  . aspirin (ASPIRIN EC) 81 MG EC tablet Take 81 mg by mouth at bedtime. Swallow whole.    . b complex vitamins tablet Take 1 tablet by mouth 2 (two) times daily.    [name]. BIOTIN 5000 PO Take 1 tablet by mouth 2 (two) times daily.    . calcium carbonate (CALCIUM 600) 600 MG  TABS tablet Take 1 tablet by mouth daily.    . Cyanocobalamin (VITAMIN B12 TR PO) Take 500 mcg by mouth at bedtime.     . diphenhydrAMINE (BENADRYL) 25 mg capsule Take 25 mg by mouth 3 (three) times daily.    [name]. gabapentin (NEURONTIN) 300 MG capsule Take 300 mg by mouth at bedtime.    . lamoTRIgine (LAMICTAL) 25 MG tablet Take 2 tablets (50 mg total) daily by mouth. Must see physician for future refills. 180 tablet 1  . loperamide (IMODIUM A-D) 2 MG tablet Take 2 mg by mouth as needed.     . metoCLOPramide (REGLAN) 10 MG tablet Take 10 mg by mouth 2 (two) times daily.    . nitroGLYCERIN (NITROSTAT) 0.3 MG SL tablet Place 0.3 mg under the tongue every 5 (five) minutes as needed for chest pain.    [name]. omeprazole (PRILOSEC) 40 MG capsule Take 40 mg by mouth 2 (two) times daily.    . primidone (MYSOLINE) 50 MG tablet Take by mouth 3 (three) times daily.    . Simethicone (GAS RELIEF) 125 MG CAPS Take 125 mg by mouth daily.    [name]. venlafaxine XR (EFFEXOR-XR) 150 MG 24 hr capsule Take 1 capsule (150 mg total) daily by mouth. 90 capsule 1   No current facility-administered medications on file prior to visit.     Review of Systems: No fever or chills No Cp or SOB Notes that she has been eating more and gained a few lbs  Wt Readings from Last 3 Encounters:  06/02/17 155 lb (70.3 kg)  03/12/17 145 lb (65.8 kg)  12/11/16 150 lb 3.2 oz (68.1 kg)    As per HPI- otherwise negative.   Physical Examination: Vitals:   06/02/17 1249 06/02/17 1323  BP: (!) 135/58 135/80  Pulse: 62   Resp: 16   Temp: 98.7 F (37.1 [name])   SpO2: 100%    Vitals:   06/02/17 1249  Weight: 155 lb (70.3 kg)   Body mass index is 26.61 kg/m. Ideal Body Weight:    GEN: WDWN, NAD, Non-toxic, A & O x 3, looks well HEENT: Atraumatic, Normocephalic. Neck supple. No masses, No LAD.  Bilateral TM wnl, oropharynx normal.  PEERL,EOMI.   Ears and Nose: No external deformity. CV: RRR, No M/G/R. No JVD. No thrill. No extra heart  sounds. PULM: CTA B, no wheezes, crackles, rhonchi. No retractions. No resp. distress. No accessory muscle use. EXTR: No [name]/[name]/e NEURO Normal gait.   Fine  tremor of both hands is present today PSYCH: Normally interactive. Conversant. Not depressed or anxious appearing.  Calm demeanor.    Assessment and Plan: History of gastric bypass - Plan: Ambulatory referral to Gastroenterology  Tremor, hereditary, benign - Plan: Ambulatory referral to Neurology  Recurrent major depressive disorder, in remission (HCC)  Medication monitoring encounter - Plan: CBC, Comprehensive metabolic panel  Esophagitis - Plan: Ambulatory referral to Gastroenterology  Here today to check on a couple of concerns Referral to GI and to neurology CBC and CMP today Plan to followup in about 6 months  Signed Abbe Amsterdam, MD  Received her labs 12/18 Results for orders placed or performed in visit on 06/02/17  CBC  Result Value Ref Range   WBC 4.5 4.0 - 10.5 K/uL   RBC 3.74 (L) 3.87 - 5.11 Mil/uL   Platelets 214.0 150.0 - 400.0 K/uL   Hemoglobin 12.0 12.0 - 15.0 g/dL   HCT 16.1 (L) 09.6 - 04.5 %   MCV 95.3 78.0 - 100.0 fl   MCHC 33.7 30.0 - 36.0 g/dL   RDW 40.9 81.1 - 91.4 %  Comprehensive metabolic panel  Result Value Ref Range   Sodium 129 (L) 135 - 145 mEq/L   Potassium 4.7 3.5 - 5.1 mEq/L   Chloride 94 (L) 96 - 112 mEq/L   CO2 32 19 - 32 mEq/L   Glucose, Bld 105 (H) 70 - 99 mg/dL   BUN 7 6 - 23 mg/dL   Creatinine, Ser 7.82 0.40 - 1.20 mg/dL   Total Bilirubin 0.4 0.2 - 1.2 mg/dL   Alkaline Phosphatase 107 39 - 117 U/L   AST 24 0 - 37 U/L   ALT 17 0 - 35 U/L   Total Protein 6.7 6.0 - 8.3 g/dL   Albumin 4.2 3.5 - 5.2 g/dL   Calcium 8.9 8.4 - 95.6 mg/dL   GFR 21.30 >86.57 mL/min   Hyponatremia- may be due to overhydration Called and LMOM- I will place order for repeat BMP.  Please have done this week or Monday at the latest.  Do not overhydrate between now and then

## 2017-06-02 ENCOUNTER — Ambulatory Visit (INDEPENDENT_AMBULATORY_CARE_PROVIDER_SITE_OTHER): Payer: Medicare Other | Admitting: Family Medicine

## 2017-06-02 ENCOUNTER — Encounter: Payer: Self-pay | Admitting: Family Medicine

## 2017-06-02 VITALS — BP 135/80 | HR 62 | Temp 98.7°F | Resp 16 | Wt 155.0 lb

## 2017-06-02 DIAGNOSIS — I6521 Occlusion and stenosis of right carotid artery: Secondary | ICD-10-CM | POA: Diagnosis not present

## 2017-06-02 DIAGNOSIS — E871 Hypo-osmolality and hyponatremia: Secondary | ICD-10-CM | POA: Diagnosis not present

## 2017-06-02 DIAGNOSIS — Z5181 Encounter for therapeutic drug level monitoring: Secondary | ICD-10-CM | POA: Diagnosis not present

## 2017-06-02 DIAGNOSIS — G250 Essential tremor: Secondary | ICD-10-CM

## 2017-06-02 DIAGNOSIS — Z9884 Bariatric surgery status: Secondary | ICD-10-CM | POA: Diagnosis not present

## 2017-06-02 DIAGNOSIS — F3340 Major depressive disorder, recurrent, in remission, unspecified: Secondary | ICD-10-CM | POA: Diagnosis not present

## 2017-06-02 DIAGNOSIS — K209 Esophagitis, unspecified without bleeding: Secondary | ICD-10-CM

## 2017-06-02 LAB — COMPREHENSIVE METABOLIC PANEL
ALBUMIN: 4.2 g/dL (ref 3.5–5.2)
ALK PHOS: 107 U/L (ref 39–117)
ALT: 17 U/L (ref 0–35)
AST: 24 U/L (ref 0–37)
BUN: 7 mg/dL (ref 6–23)
CO2: 32 mEq/L (ref 19–32)
CREATININE: 0.66 mg/dL (ref 0.40–1.20)
Calcium: 8.9 mg/dL (ref 8.4–10.5)
Chloride: 94 mEq/L — ABNORMAL LOW (ref 96–112)
GFR: 93.61 mL/min (ref >60.00)
GLUCOSE: 105 mg/dL — AB (ref 70–99)
POTASSIUM: 4.7 meq/L (ref 3.5–5.1)
SODIUM: 129 meq/L — AB (ref 135–145)
TOTAL PROTEIN: 6.7 g/dL (ref 6.0–8.3)
Total Bilirubin: 0.4 mg/dL (ref 0.2–1.2)

## 2017-06-02 LAB — CBC
HEMATOCRIT: 35.7 % — AB (ref 36.0–46.0)
Hemoglobin: 12 g/dL (ref 12.0–15.0)
MCHC: 33.7 g/dL (ref 30.0–36.0)
MCV: 95.3 fl (ref 78.0–100.0)
Platelets: 214 10*3/uL (ref 150.0–400.0)
RBC: 3.74 Mil/uL — AB (ref 3.87–5.11)
RDW: 13.7 % (ref 11.5–15.5)
WBC: 4.5 10*3/uL (ref 4.0–10.5)

## 2017-06-02 NOTE — Patient Instructions (Signed)
Good to see you today-  I will refer you to neurology and to gastroenterology I will be in touch with your labs asap Take care and happy holidays!

## 2017-06-03 NOTE — Addendum Note (Signed)
Addended by: Abbe AmsterdamOPLAND, Lerae Langham C on: 06/03/2017 12:30 PM   Modules accepted: Orders

## 2017-06-04 ENCOUNTER — Other Ambulatory Visit (INDEPENDENT_AMBULATORY_CARE_PROVIDER_SITE_OTHER): Payer: Medicare Other

## 2017-06-04 ENCOUNTER — Telehealth: Payer: Self-pay | Admitting: Family Medicine

## 2017-06-04 DIAGNOSIS — E871 Hypo-osmolality and hyponatremia: Secondary | ICD-10-CM | POA: Diagnosis not present

## 2017-06-04 LAB — BASIC METABOLIC PANEL
BUN: 10 mg/dL (ref 6–23)
CHLORIDE: 98 meq/L (ref 96–112)
CO2: 29 meq/L (ref 19–32)
Calcium: 9.3 mg/dL (ref 8.4–10.5)
Creatinine, Ser: 0.75 mg/dL (ref 0.40–1.20)
GFR: 80.77 mL/min (ref >60.00)
Glucose, Bld: 53 mg/dL — ABNORMAL LOW (ref 70–99)
POTASSIUM: 4.5 meq/L (ref 3.5–5.1)
SODIUM: 135 meq/L (ref 135–145)

## 2017-06-04 NOTE — Telephone Encounter (Signed)
Copied from CRM [phone]#[id number]. Topic: Quick Communication - See Telephone Encounter >> Jun 04, 2017 12:26 PM Valentina LucksMatos, Jackelin wrote: CRM for notification. See Telephone encounter for:  06/04/17.

## 2017-06-04 NOTE — Telephone Encounter (Signed)
Pt came in office to have labs done but wanted to know if provider can write a letter to [employer] stating that pt's gold weight would be 150 pounds. Please call pt when letter ready.

## 2017-06-04 NOTE — Telephone Encounter (Signed)
Called her back- her Na is back to normal.   I will also write the letter she asked for and mail it to her

## 2017-06-09 DIAGNOSIS — Z1231 Encounter for screening mammogram for malignant neoplasm of breast: Secondary | ICD-10-CM | POA: Diagnosis not present

## 2017-06-12 DIAGNOSIS — I6523 Occlusion and stenosis of bilateral carotid arteries: Secondary | ICD-10-CM | POA: Diagnosis not present

## 2017-06-16 ENCOUNTER — Encounter: Payer: Self-pay | Admitting: Internal Medicine

## 2017-07-14 ENCOUNTER — Encounter: Payer: Self-pay | Admitting: Family Medicine

## 2017-07-14 ENCOUNTER — Ambulatory Visit (INDEPENDENT_AMBULATORY_CARE_PROVIDER_SITE_OTHER): Payer: Medicare Other | Admitting: Family Medicine

## 2017-07-14 ENCOUNTER — Ambulatory Visit: Payer: Self-pay | Admitting: *Deleted

## 2017-07-14 VITALS — BP 132/72 | HR 63 | Temp 98.0°F | Ht 60.0 in | Wt 152.4 lb

## 2017-07-14 DIAGNOSIS — R739 Hyperglycemia, unspecified: Secondary | ICD-10-CM | POA: Diagnosis not present

## 2017-07-14 DIAGNOSIS — R350 Frequency of micturition: Secondary | ICD-10-CM | POA: Diagnosis not present

## 2017-07-14 DIAGNOSIS — N3000 Acute cystitis without hematuria: Secondary | ICD-10-CM | POA: Diagnosis not present

## 2017-07-14 DIAGNOSIS — R55 Syncope and collapse: Secondary | ICD-10-CM | POA: Diagnosis not present

## 2017-07-14 LAB — CBC
HCT: 37.8 % (ref 36.0–46.0)
HEMOGLOBIN: 12.8 g/dL (ref 12.0–15.0)
MCHC: 33.9 g/dL (ref 30.0–36.0)
MCV: 94.5 fl (ref 78.0–100.0)
PLATELETS: 210 10*3/uL (ref 150.0–400.0)
RBC: 4 Mil/uL (ref 3.87–5.11)
RDW: 13.5 % (ref 11.5–15.5)
WBC: 5.6 10*3/uL (ref 4.0–10.5)

## 2017-07-14 LAB — POCT URINALYSIS DIP (MANUAL ENTRY)
Bilirubin, UA: NEGATIVE
Blood, UA: NEGATIVE
Glucose, UA: NEGATIVE mg/dL
Ketones, POC UA: NEGATIVE mg/dL
LEUKOCYTES UA: NEGATIVE
NITRITE UA: NEGATIVE
PROTEIN UA: NEGATIVE mg/dL
SPEC GRAV UA: 1.015 (ref 1.010–1.025)
Urobilinogen, UA: 0.2 E.U./dL
pH, UA: 6 (ref 5.0–8.0)

## 2017-07-14 LAB — COMPREHENSIVE METABOLIC PANEL
ALBUMIN: 4.2 g/dL (ref 3.5–5.2)
ALK PHOS: 106 U/L (ref 39–117)
ALT: 14 U/L (ref 0–35)
AST: 23 U/L (ref 0–37)
BILIRUBIN TOTAL: 0.5 mg/dL (ref 0.2–1.2)
BUN: 13 mg/dL (ref 6–23)
CO2: 30 mEq/L (ref 19–32)
Calcium: 9 mg/dL (ref 8.4–10.5)
Chloride: 93 mEq/L — ABNORMAL LOW (ref 96–112)
Creatinine, Ser: 0.66 mg/dL (ref 0.40–1.20)
GFR: 93.58 mL/min (ref >60.00)
GLUCOSE: 123 mg/dL — AB (ref 70–99)
Potassium: 4.8 mEq/L (ref 3.5–5.1)
Sodium: 130 mEq/L — ABNORMAL LOW (ref 135–145)
TOTAL PROTEIN: 6.7 g/dL (ref 6.0–8.3)

## 2017-07-14 LAB — GLUCOSE, POCT (MANUAL RESULT ENTRY): POC Glucose: 116 mg/dl — AB (ref 70–99)

## 2017-07-14 LAB — HEMOGLOBIN A1C: Hgb A1c MFr Bld: 5.3 % (ref 4.6–6.5)

## 2017-07-14 MED ORDER — GLUCOSE BLOOD VI STRP
ORAL_STRIP | 12 refills | Status: DC
Start: 2017-07-14 — End: 2017-07-14

## 2017-07-14 MED ORDER — GLUCOSE BLOOD VI STRP: ORAL_STRIP | 12 refills | Status: DC

## 2017-07-14 NOTE — Patient Instructions (Addendum)
Please stop and get your blood drawn on the way out of clinic today I have called the PA you saw at the [facility] with [facility] and am waiting on a call back  I am going to refer you to cardiology asap Please do not drive for the time being, and avoid climbing ladders, etc until we make sure that all is well

## 2017-07-14 NOTE — Telephone Encounter (Signed)
FYI

## 2017-07-14 NOTE — Telephone Encounter (Signed)
Called in saying she fainted twice on Saturday.  She fainted about 7:30am in front of the refrigerator.   She doesn't know how long she was out.   She was able to make it to the love seat after waking up.  She passed out again on the love seat until 9:30am.   She called her daughter who came over and checked her BP and sugar.   Pt was not able to recall what her BP or glucose was other than "it was high for me".    She has never had this happen before.   She feels fine this morning. I instructed her to call 911 next time this happens to her.   She said her phone wasn't close by when this happened.   Her daughter is looking into getting her a "Psychologist, educationalLife Alert Button".    I told her that was a great idea for her safety. I made an appt with Dr. Shanda BumpsJessica [name] for 9:00am this morning.    I instructed her to call 911 if this happened again either before her appt or ever in the future.   She replied,  "I'm stubborn and don't want to call 911.   I educated her on the importance of doing so especially since she is living alone. Reason for Disposition . [1] Age > 50 years  AND [2] now alert and feels fine  Answer Assessment - Initial Assessment Questions 1. ONSET: "How long were you unconscious?" (minutes) "When did it happen?"     Saturday I fainted twice.  I was in the kitchen and I just slumped into the floor.   When I woke up I was able to eat the cheese I had gotten out of the refrigerator.  I am diabetic.  I went to my love  seat felt like I was going to faint again.   I passed out again when I got to my love seat.   I woke up at 9:30am.  I texted my daughter.   She came over.   I passed out about 7:30am.   I didn't know what was going on from 7:30am-10:00am.  2. CONTENT: "What happened during period of unconsciousness?" (e.g., seizure activity)      I don't know how long I was out of it.  My sugar was checked but I don't know what it was.    I know it was high. 3. MENTAL STATUS: "Alert and oriented now?"  (oriented x 3 = name, month, location)      I've never felt this way before when my sugar is low.    4. TRIGGER: "What do you think caused the fainting?" "What were you doing just before you fainted?"  (e.g., exercise, sudden standing up, prolonged standing)     I really don't. 5. RECURRENT SYMPTOM: "Have you ever passed out before?" If so, ask: "When was the last time?" and "What happened that time?"      No.  This has never happened before. 6. INJURY: "Did you sustain any injury during the fall?"      No injuries.   I was a little stiff yesterday.    Today I feel more normal.  My BP was ok this morning.  I feel fine this morning. 7. CARDIAC SYMPTOMS: "Have you had any of the following symptoms: chest pain, difficulty breathing, palpitations?"     All my heart tests come out ok.   8. NEUROLOGIC SYMPTOMS: "Have you had any of the following symptoms:  headache, numbness, vertigo, weakness?"     No strokes.   My carotids are starting to close up.  I had a test on Dec. 27th for my carotids and my doctor said watch for the beginning of strokes. 9. GI SYMPTOMS: "Have you had any of the following symptoms: abdominal pain, vomiting, diarrhea, blood in stools?"     No diarrhea or vomiting.   I had a sore throat last week.  I gargled with salt water and I'm fine now. 10. OTHER SYMPTOMS: "Do you have any other symptoms?"       My head felt funny.  I wasn't dizzy I just felt really weak all of a sudden. 11. PREGNANCY: "Is there any chance you are pregnant?" "When was your last menstrual period?"       Not asked due to age.  Protocols used: [facility]

## 2017-07-14 NOTE — Progress Notes (Addendum)
[facility] [address] [place], [place] [address] [phone]  Date:  07/14/2017   Name:  Alexis [name]   DOB:  1946/01/28   MRN:  [id number]  PCP:  Pearline Cables, MD    Chief Complaint: Loss of Consciousness (c/o fainting twice this past Saturday. )   History of Present Illness:  Alexis [name] is a 72 y.o. very pleasant female patient who presents with the following:  Last seen by myself about 6 weeks ago See phone triage note from earlier today:  Called in saying she fainted twice on Saturday.  She fainted about 7:30am in front of the refrigerator.   She doesn't know how long she was out.   She was able to make it to the love seat after waking up.  She passed out again on the love seat until 9:30am.   She called her daughter who came over and checked her BP and sugar.   Pt was not able to recall what her BP or glucose was other than "it was high for me".    She has never had this happen before.   She feels fine this morning. I instructed her to call 911 next time this happens to her.   She said her phone wasn't close by when this happened.   Her daughter is looking into getting her a "Psychologist, educational".    I told her that was a great idea for her safety. I made an appt with Dr. Shanda Bumps [name] for 9:00am this morning.    I instructed her to call 911 if this happened again either before her appt or ever in the future.   She replied,  "I'm stubborn and don't want to call 911.   I educated her on the importance of doing so especially since she is living alone. Today is Monday- she fainted on Saturday Here today with her daughter Alexis [name] who contributes to the history   Pt does have known carotid stenosis- I do not see a recent carotid US in the chart.  Per my notes from September we planned to have her see vascular surgery about this - it looks like she did see [facility] cardiology/ vascular and had a doppler done in  December however I cannot view this report.  Pt states that she was told to follow-up in one year.   On the morning of the incident, pt reports that she went to the fridge to get breakfast around 8:30 am but passed out.  She awoke on the ground- she was not sure how long she was out.  She laid on the floor and rested, then she got back up.  She started to walk to her living room but passed out again in the loveseat.   When she came too she called her daughter.   Daughter arrived about 10:30 She is not aware of any injury and did not hit her head.   She did not have any incontinence  Her daughter reports that by the time she arrived at 10:30 am, her mom appeared normal.  She was not confused at all. They monitored her blood pressure and her blood sugar and all seemed to be ok  Sugar was 241, then 161 am after eating   BP 141/80, 128/77 She does not remember the exact numbers, but we think her pulse was normal Daughter reports that they specifically looked for any sign of stroke like facial  drooping, arm weakness or slurred speech and did not see these.    Pt reports that her carotids needed to be repeated in one year at her last US visit- otherwise I cannot see detailed information about this visit or her carotid US report No CP or SOB recently She has noted a bit of a frontal HA recently-she took some tylenol yesterday and it went away, no HA today.    She did have a ST for a week recently, but this is now resolved She has never had syncope in the past  Called [facility] heart and vascular at about 9:45 am and again at 4:45 pm- was told that message was being passed to provider to call me -I need to ask about her carotid US. So far no call back   Here today with her daughter Alexis Lushndrea. Pt is not driving at this time and will continue to not drive until we make sure she is not at high risk of repeat syncope Today she really feels fine   Lab Results  Component Value Date   HGBA1C 4.7 10/24/2016     Patient Active Problem List   Diagnosis Date Noted  . Restless legs 12/11/2016  . History of gastric bypass 12/11/2016  . PTSD (post-traumatic stress disorder) 12/11/2016  . 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 Anemia   . Back pain   . Diabetes mellitus, type II (HCC)   . GERD (gastroesophageal reflux disease)   . Hyperlipidemia   . Memory deficits   . PTSD (post-traumatic stress disorder)   . Thyroid disease     Past Surgical History:  Procedure Laterality Date  . CHOLECYSTECTOMY    . GASTRIC BYPASS    . TONSILLECTOMY      Social History   Tobacco Use  . Smoking status: Never Smoker  . Smokeless tobacco: Never Used  Substance Use Topics  . Alcohol use: No    Alcohol/week: 0.0 oz  . Drug use: No    Family History  Problem Relation Age of Onset  . Depression Mother   . Alcohol abuse Father   . Tremor Father     Allergies  Allergen Reactions  . Metformin   . Penicillins     Other reaction(s): HIVES    Medication list has been reviewed and updated.  Current Outpatient Medications on File Prior to Visit  Medication Sig Dispense Refill  . aspirin (ASPIRIN EC) 81 MG EC tablet Take 81 mg by mouth at bedtime. Swallow whole.    . b complex vitamins tablet Take 1 tablet by mouth 2 (two) times daily.    [name]. BIOTIN 5000 PO Take 1 tablet by mouth 2 (two) times daily.    . calcium carbonate (CALCIUM 600) 600 MG TABS tablet Take 1 tablet by mouth daily.    . Cyanocobalamin (VITAMIN B12 TR PO) Take 500 mcg by mouth at bedtime.     . diphenhydrAMINE (BENADRYL) 25 mg capsule Take 25 mg by mouth 3 (three) times daily.    [name]. gabapentin (NEURONTIN) 300 MG capsule Take 300 mg by mouth at bedtime.    . lamoTRIgine (LAMICTAL) 25 MG tablet Take 2 tablets (50 mg total) daily by mouth. Must see physician for future refills. 180 tablet 1  . loperamide (IMODIUM A-D) 2 MG tablet Take 2 mg by mouth as needed.     . metoCLOPramide  (REGLAN) 10 MG tablet Take 10 mg by mouth 2 (two) times  daily.    . nitroGLYCERIN (NITROSTAT) 0.3 MG SL tablet Place 0.3 mg under the tongue every 5 (five) minutes as needed for chest pain.    [name] omeprazole (PRILOSEC) 40 MG capsule Take 40 mg by mouth 2 (two) times daily.    . primidone (MYSOLINE) 50 MG tablet Take by mouth 3 (three) times daily.    . Simethicone (GAS RELIEF) 125 MG CAPS Take 125 mg by mouth daily.    [name] venlafaxine XR (EFFEXOR-XR) 150 MG 24 hr capsule Take 1 capsule (150 mg total) daily by mouth. 90 capsule 1   No current facility-administered medications on file prior to visit.     Review of Systems:  As per HPI- otherwise negative.   Physical Examination: Vitals:   07/14/17 0902  BP: 132/72  Pulse: 63  Temp: 98 F (36.7 C)  SpO2: 98%   Vitals:   07/14/17 0902  Weight: 152 lb 6.4 oz (69.1 kg)  Height: 5' (1.524 m)   Body mass index is 29.76 kg/m. Ideal Body Weight: Weight in (lb) to have BMI = 25: 127.7  GEN: WDWN, NAD, Non-toxic, A & O x 3, looks well HEENT: Atraumatic, Normocephalic. Neck supple. No masses, No LAD.  Bilateral TM wnl, oropharynx normal.  PEERL,EOMI.   Chipped front tooth but this is not from recent accident.  Pt has a somewhat unusual way of talking (she keeps her teeth together while speaking) but this is not new Ears and Nose: No external deformity. CV: RRR, No M/G/R. No JVD. No thrill. No extra heart sounds. PULM: CTA B, no wheezes, crackles, rhonchi. No retractions. No resp. distress. No accessory muscle use. ABD: S, NT, ND, +BS. No rebound. No HSM. EXTR: No c/c/e NEURO Normal gait.  PSYCH: Normally interactive. Conversant. Not depressed or anxious appearing.  Calm demeanor.  Looks well Normal neuro exam including strength, sensation and DTR of all extremities Negative Romberg    EKG: sinus rhythm, normal  Orthostatic VS for the past 24 hrs:  BP- Lying Pulse- Lying BP- Sitting Pulse- Sitting BP- Standing at 0 minutes Pulse-  Standing at 0 minutes  07/14/17 0943 122/74 62 126/68 69 120/64 78     Assessment and Plan: Syncope and collapse - Plan: EKG 12-Lead, POCT urinalysis dipstick, Urine Culture, CBC, Comprehensive metabolic panel, Hemoglobin A1c, POCT glucose (manual entry), Ambulatory referral to Cardiology  Hyperglycemia - Plan: Hemoglobin A1c, glucose blood (ACCU-CHEK ACTIVE STRIPS) test strip, DISCONTINUED: glucose blood (ACCU-CHEK ACTIVE STRIPS) test strip  Urinary frequency - Plan: Urine Culture  Recent syncopal episodes.  Unknown etiology.  She now feels fine.  Work-up so far negative- no sign of UTI, glucose and EKG normal  ddx includes carotid stenosis and cardiac arrhythmia, hypotension, hypoglycemia. Less likely TIA or CVA as pt has no residual sx about an hour after she fainted   Signed Abbe Amsterdam, MD  Called pt to go over her labs- ok except for mild hyponatremia. She is drinking "my 8 glasses of water a day." Asked her to drink just when she is thirsty but not to try and fulfill a requirement as she may be overhydrated.  I will continue to try and reach the vascular clinic at [facility] tomorrow  Referral placed to cardiology-urgent priority She is to alert me if any return of these sx or other concern  Called vascular at [facility] on 1/29- left message again for a call back to discuss her carotid dopplers  Did hear back from [facility]  today- her vascular studies  were more or less normal- it does not appear that carotid disease is likely to be the cause of her syncope   Results for orders placed or performed in visit on 07/14/17  Urine Culture  Result Value Ref Range   MICRO NUMBER: [id number]    SPECIMEN QUALITY: ADEQUATE    Sample Source NOT GIVEN    STATUS: FINAL    ISOLATE 1: Streptococcus agalactiae (A)   CBC  Result Value Ref Range   WBC 5.6 4.0 - 10.5 K/uL   RBC 4.00 3.87 - 5.11 Mil/uL   Platelets 210.0 150.0 - 400.0 K/uL   Hemoglobin 12.8 12.0 - 15.0 g/dL   HCT 81.1 91.4 - 78.2 %   MCV  94.5 78.0 - 100.0 fl   MCHC 33.9 30.0 - 36.0 g/dL   RDW 95.6 21.3 - 08.6 %  Comprehensive metabolic panel  Result Value Ref Range   Sodium 130 (L) 135 - 145 mEq/L   Potassium 4.8 3.5 - 5.1 mEq/L   Chloride 93 (L) 96 - 112 mEq/L   CO2 30 19 - 32 mEq/L   Glucose, Bld 123 (H) 70 - 99 mg/dL   BUN 13 6 - 23 mg/dL   Creatinine, Ser 5.78 0.40 - 1.20 mg/dL   Total Bilirubin 0.5 0.2 - 1.2 mg/dL   Alkaline Phosphatase 106 39 - 117 U/L   AST 23 0 - 37 U/L   ALT 14 0 - 35 U/L   Total Protein 6.7 6.0 - 8.3 g/dL   Albumin 4.2 3.5 - 5.2 g/dL   Calcium 9.0 8.4 - 46.9 mg/dL   GFR 62.95 >28.41 mL/min  Hemoglobin A1c  Result Value Ref Range   Hgb A1c MFr Bld 5.3 4.6 - 6.5 %  POCT urinalysis dipstick  Result Value Ref Range   Color, UA yellow yellow   Clarity, UA clear clear   Glucose, UA negative negative mg/dL   Bilirubin, UA negative negative   Ketones, POC UA negative negative mg/dL   Spec Grav, UA 3.244 0.102 - 1.025   Blood, UA negative negative   pH, UA 6.0 5.0 - 8.0   Protein Ur, POC negative negative mg/dL   Urobilinogen, UA 0.2 0.2 or 1.0 E.U./dL   Nitrite, UA Negative Negative   Leukocytes, UA Negative Negative  POCT glucose (manual entry)  Result Value Ref Range   POC Glucose 116 (A) 70 - 99 mg/dl    Called pt to check on her 1/29- she is doing well.  No further incidents.  Has not yet heard from cardiology so I will check on this tomorrow for her   1/30- pt has a cardiology appt tomorrow  Also, her urine did come back positive for a GBS infection She is allergic to penicillin - hives- so will also need to avoid cephalosporin.  Best option is levaquin, only need to treat for 3 days   Her qtc is 412 msec so ok to use this med   Gave her a call on 2/3 She is NOT on plavix.  Advised that her repeat BMP looks better She notes a ST- asked her to come and see me if this persists

## 2017-07-15 LAB — URINE CULTURE
MICRO NUMBER: 90115747
SPECIMEN QUALITY: ADEQUATE

## 2017-07-16 MED ORDER — LEVOFLOXACIN 250 MG PO TABS: 250.0000 mg | ORAL_TABLET | Freq: Every day | ORAL | 0 refills | Status: DC

## 2017-07-16 NOTE — Addendum Note (Signed)
Addended by: Abbe AmsterdamOPLAND, JESSICA C on: 07/16/2017 05:00 PM   Modules accepted: Orders

## 2017-07-17 ENCOUNTER — Telehealth: Payer: Self-pay | Admitting: Family Medicine

## 2017-07-17 ENCOUNTER — Ambulatory Visit (INDEPENDENT_AMBULATORY_CARE_PROVIDER_SITE_OTHER): Payer: Medicare Other | Admitting: Cardiology

## 2017-07-17 ENCOUNTER — Encounter: Payer: Self-pay | Admitting: Cardiology

## 2017-07-17 ENCOUNTER — Other Ambulatory Visit: Payer: Self-pay | Admitting: Cardiology

## 2017-07-17 VITALS — BP 118/72 | HR 75 | Ht 60.0 in | Wt 151.1 lb

## 2017-07-17 DIAGNOSIS — R0602 Shortness of breath: Secondary | ICD-10-CM | POA: Diagnosis not present

## 2017-07-17 DIAGNOSIS — I6523 Occlusion and stenosis of bilateral carotid arteries: Secondary | ICD-10-CM | POA: Diagnosis not present

## 2017-07-17 DIAGNOSIS — E119 Type 2 diabetes mellitus without complications: Secondary | ICD-10-CM

## 2017-07-17 DIAGNOSIS — Z9884 Bariatric surgery status: Secondary | ICD-10-CM

## 2017-07-17 DIAGNOSIS — E871 Hypo-osmolality and hyponatremia: Secondary | ICD-10-CM | POA: Diagnosis not present

## 2017-07-17 DIAGNOSIS — R55 Syncope and collapse: Secondary | ICD-10-CM | POA: Insufficient documentation

## 2017-07-17 DIAGNOSIS — R739 Hyperglycemia, unspecified: Secondary | ICD-10-CM

## 2017-07-17 DIAGNOSIS — Z13220 Encounter for screening for lipoid disorders: Secondary | ICD-10-CM

## 2017-07-17 MED ORDER — GLUCOSE BLOOD VI STRP: ORAL_STRIP | 12 refills | Status: DC

## 2017-07-17 NOTE — Progress Notes (Signed)
Cardiology Office Note:    Date:  07/17/2017   ID:  Alexis [name], DOB 06/30/1945, MRN [id number]  PCP:  Alexis Cablesopland, Jessica C, MD  Cardiologist:  Alexis Brothersajan R Revankar, MD   Referring MD: Alexis Cablesopland, Jessica C, MD    ASSESSMENT:    1. Syncope, unspecified syncope type   2. Screening for hyperlipidemia   3. Shortness of breath   4. Bilateral carotid artery stenosis   5. Type 2 diabetes mellitus without complication, without long-term current use of insulin (HCC)   6. History of gastric bypass   7. Hyponatremia    PLAN:    In order of problems listed above:  1. Secondary prevention stressed with patient.  Importance of compliance with diet and medications stressed and she vocalized understanding.  She has bilateral carotid artery stenosis which is being now evaluated in follow-up by vascular surgeon.  In view of this I would do her lipids.  She has a extremely good HDL.  But I think she would be a candidate for statin therapy in view of documented atherosclerotic vascular disease.  I will address this after evaluating her lipids. 2. Echocardiogram will be done to assess murmur heard on auscultation.  For her shortness of breath she will undergo Lexiscan sestamibi in view of risk factors for coronary artery disease. 3. Patient will have a 48-hour Holter monitoring in view of the syncope.  I also noted that her TSH was within normal limits.  She needs to go to the nearest emergency room for any recurring symptoms.  I will also obtain her Chem-7 today in view of the fact that she had recent mild hyponatremia and this has been requested by her primary care physician.  He has been requested to stay off driving by her primary care physician and I agree with this still her evaluation is complete.   Medication Adjustments/Labs and Tests Ordered: Current medicines are reviewed at length with the patient today.  Concerns regarding medicines are outlined above.  Orders Placed This Encounter  Procedures    . Basic metabolic panel  . Lipid panel  . HOLTER MONITOR - 48 HOUR  . MYOCARDIAL PERFUSION IMAGING  . ECHOCARDIOGRAM COMPLETE   No orders of the defined types were placed in this encounter.    History of Present Illness:    Alexis [name] is a 72 y.o. female who is being seen today for the evaluation of syncope at the request of [name], [name], MD.  Patient is accompanied by her daughter.  She is a pleasant 72 year old female.  She has no significant past medical history except diet-controlled diabetes mellitus.  She mentions to me that a few days ago she did pass out.  This was early in the morning she had not eaten anything.  It was about 12 hours since she had her last snack.  No chest pain orthopnea or PND.  She mentions to me that subsequently after an hour or 2 she had a similar episode.  Since then she is doing fine.  No chest pain.  She has some shortness of breath when she exerts.  She leads a sedentary lifestyle but does walk some on a regular basis.  At the time of my evaluation, the patient is alert awake oriented and in no distress.  Past Medical History:  Diagnosis Date  . Anemia   . Back pain   . Bladder infection   . Diabetes mellitus, type II (HCC)   . GERD (gastroesophageal reflux disease)   .  Hyperlipidemia   . Memory deficits   . PTSD (post-traumatic stress disorder)   . Thyroid disease     Past Surgical History:  Procedure Laterality Date  . CHOLECYSTECTOMY    . GASTRIC BYPASS    . TONSILLECTOMY      Current Medications: Current Meds  Medication Sig  . aspirin (ASPIRIN EC) 81 MG EC tablet Take 81 mg by mouth at bedtime. Swallow whole.  . b complex vitamins tablet Take 1 tablet by mouth 2 (two) times daily.  [name] BIOTIN 5000 PO Take 1 tablet by mouth 2 (two) times daily.  . calcium carbonate (CALCIUM 600) 600 MG TABS tablet Take 1 tablet by mouth daily.  . clopidogrel (PLAVIX) 75 MG tablet Take by mouth.  . Cyanocobalamin (VITAMIN B12 TR PO) Take 500  mcg by mouth at bedtime.   . diphenhydrAMINE (BENADRYL) 25 mg capsule Take 25 mg by mouth 3 (three) times daily.  . ferrous sulfate 325 (65 FE) MG tablet Take by mouth.  . gabapentin (NEURONTIN) 300 MG capsule Take 300 mg by mouth at bedtime.  [name] glucose blood (ACCU-CHEK ACTIVE STRIPS) test strip Use as instructed- up to once a day.  Dx E11.9  . lamoTRIgine (LAMICTAL) 25 MG tablet Take 2 tablets (50 mg total) daily by mouth. Must see physician for future refills.  [name] levofloxacin (LEVAQUIN) 250 MG tablet Take 1 tablet (250 mg total) by mouth daily.  [name] loperamide (IMODIUM A-D) 2 MG tablet Take 2 mg by mouth as needed.   . meclizine (ANTIVERT) 12.5 MG tablet Take by mouth.  . metoCLOPramide (REGLAN) 10 MG tablet Take 10 mg by mouth 2 (two) times daily.  [name] omeprazole (PRILOSEC) 40 MG capsule Take 40 mg by mouth 2 (two) times daily.  . primidone (MYSOLINE) 50 MG tablet Take by mouth 3 (three) times daily.  . Simethicone (GAS RELIEF) 125 MG CAPS Take 125 mg by mouth daily.  [name] venlafaxine XR (EFFEXOR-XR) 150 MG 24 hr capsule Take 1 capsule (150 mg total) daily by mouth.     Allergies:   Metformin and Penicillins   Social History   Socioeconomic History  . Marital status: Unknown    Spouse name: None  . Number of children: None  . Years of education: None  . Highest education level: None  Social Needs  . Financial resource strain: Not very hard  . Food insecurity - worry: Never true  . Food insecurity - inability: Never true  . Transportation needs - medical: Yes  . Transportation needs - non-medical: Yes  Occupational History  . None  Tobacco Use  . Smoking status: Never Smoker  . Smokeless tobacco: Never Used  Substance and Sexual Activity  . Alcohol use: No    Alcohol/week: 0.0 oz  . Drug use: No  . Sexual activity: No  Other Topics Concern  . None  Social History Narrative  . None     Family History: The patient's family history includes Alcohol abuse in her father;  Depression in her mother; Tremor in her father.  ROS:   Please see the history of present illness.    All other systems reviewed and are negative.  EKGs/Labs/Other Studies Reviewed:    The following studies were reviewed today: I reviewed records also EKG which was largely unremarkable.   Recent Labs: 10/24/2016: TSH 0.79 07/14/2017: ALT 14; BUN 13; Creatinine, Ser 0.66; Hemoglobin 12.8; Platelets 210.0; Potassium 4.8; Sodium 130  Recent Lipid Panel    Component Value Date/Time  CHOL 239 (A) 10/24/2016   TRIG 66 10/24/2016   HDL 100 (A) 10/24/2016   LDLCALC 125 10/24/2016    Physical Exam:    VS:  BP 118/72 (BP Location: Left Arm, Patient Position: Sitting, Cuff Size: Normal)   Pulse 75   Ht 5' (1.524 m)   Wt 151 lb 1.9 oz (68.5 kg)   SpO2 97%   BMI 29.51 kg/m     Wt Readings from Last 3 Encounters:  07/17/17 151 lb 1.9 oz (68.5 kg)  07/14/17 152 lb 6.4 oz (69.1 kg)  06/02/17 155 lb (70.3 kg)     GEN: Patient is in no acute distress HEENT: Normal NECK: No JVD; No carotid bruits LYMPHATICS: No lymphadenopathy CARDIAC: S1 S2 regular, 2/6 systolic murmur at the apex. RESPIRATORY:  Clear to auscultation without rales, wheezing or rhonchi  ABDOMEN: Soft, non-tender, non-distended MUSCULOSKELETAL:  No edema; No deformity  SKIN: Warm and dry NEUROLOGIC:  Alert and oriented x 3 PSYCHIATRIC:  Normal affect    Signed, Alexis Brothers, MD  07/17/2017 10:20 AM    [facility]

## 2017-07-17 NOTE — Telephone Encounter (Signed)
Copied from CRM [id number]. Topic: Quick Communication - Rx Refill/Question >> Jul 17, 2017  8:19 AM Crist InfanteHarrald, Kathy J wrote: Medication: glucose blood (ACCU-CHEK ACTIVE STRIPS) test strip Pharmacy is calling to ask if you can put another Dx code on this Rx. Pt is billing under Med Part B, and they will not cover for the current Dx code put on this rx which is for hypoglycemia.  Can you resend with a dx for DM [id number] - [place], [place] - [phone] (Phone) [phone] (Fax)

## 2017-07-17 NOTE — Telephone Encounter (Signed)
Dx changed and rx resent.

## 2017-07-17 NOTE — Patient Instructions (Signed)
Medication Instructions:  Your physician recommends that you continue on your current medications as directed. Please refer to the Current Medication list given to you today.  Labwork: Your physician recommends that you have the following labs drawn: BMP today and lipid panel when you go for your testing- please go fasting  Testing/Procedures: Your physician has requested that you have an echocardiogram. Echocardiography is a painless test that uses sound waves to create images of your heart. It provides your doctor with information about the size and shape of your heart and how well your heart's chambers and valves are working. This procedure takes approximately one hour. There are no restrictions for this procedure.  Your physician has requested that you have a lexiscan myoview. For further information please visit [email or web address]. Please follow instruction sheet, as given.  Your physician has recommended that you wear a holter monitor. Holter monitors are medical devices that record the heart's electrical activity. Doctors most often use these monitors to diagnose arrhythmias. Arrhythmias are problems with the speed or rhythm of the heartbeat. The monitor is a small, portable device. You can wear one while you do your normal daily activities. This is usually used to diagnose what is causing palpitations/syncope (passing out).  Follow-Up: Your physician recommends that you schedule a follow-up appointment in: 1 month  Any Other Special Instructions Will Be Listed Below (If Applicable).     If you need a refill on your cardiac medications before your next appointment, please call your pharmacy.   [facility]  Garey HamAshley A, RN, BSN   Echocardiogram An echocardiogram, or echocardiography, uses sound waves (ultrasound) to produce an image of your heart. The echocardiogram is simple, painless, obtained within a short period of time, and offers valuable information to your health care  provider. The images from an echocardiogram can provide information such as:  Evidence of coronary artery disease (CAD).  Heart size.  Heart muscle function.  Heart valve function.  Aneurysm detection.  Evidence of a past heart attack.  Fluid buildup around the heart.  Heart muscle thickening.  Assess heart valve function.  Tell a health care provider about:  Any allergies you have.  All medicines you are taking, including vitamins, herbs, eye drops, creams, and over-the-counter medicines.  Any problems you or family members have had with anesthetic medicines.  Any blood disorders you have.  Any surgeries you have had.  Any medical conditions you have.  Whether you are pregnant or may be pregnant. What happens before the procedure? No special preparation is needed. Eat and drink normally. What happens during the procedure?  In order to produce an image of your heart, gel will be applied to your chest and a wand-like tool (transducer) will be moved over your chest. The gel will help transmit the sound waves from the transducer. The sound waves will harmlessly bounce off your heart to allow the heart images to be captured in real-time motion. These images will then be recorded.  You may need an IV to receive a medicine that improves the quality of the pictures. What happens after the procedure? You may return to your normal schedule including diet, activities, and medicines, unless your health care provider tells you otherwise. This information is not intended to replace advice given to you by your health care provider. Make sure you discuss any questions you have with your health care provider. Document Released: 05/31/2000 Document Revised: 01/20/2016 Document Reviewed: 02/08/2013 Elsevier Interactive Patient Education  2017 Elsevier Inc.  Holter Monitoring  A Holter monitor is a small device that is used to detect abnormal heart rhythms. It clips to your clothing and  is connected by wires to flat, sticky disks (electrodes) that attach to your chest. It is worn continuously for 24-48 hours. Follow these instructions at home:  Wear your Holter monitor at all times, even while exercising and sleeping, for as long as directed by your health care provider.  Make sure that the Holter monitor is safely clipped to your clothing or close to your body as recommended by your health care provider.  Do not get the monitor or wires wet.  Do not put body lotion or moisturizer on your chest.  Keep your skin clean.  Keep a diary of your daily activities, such as walking and doing chores. If you feel that your heartbeat is abnormal or that your heart is fluttering or skipping a beat: ? Record what you are doing when it happens. ? Record what time of day the symptoms occur.  Return your Holter monitor as directed by your health care provider.  Keep all follow-up visits as directed by your health care provider. This is important. Get help right away if:  You feel lightheaded or you faint.  You have trouble breathing.  You feel pain in your chest, upper arm, or jaw.  You feel sick to your stomach and your skin is pale, cool, or damp.  You heartbeat feels unusual or abnormal. This information is not intended to replace advice given to you by your health care provider. Make sure you discuss any questions you have with your health care provider. Document Released: 03/01/2004 Document Revised: 11/09/2015 Document Reviewed: 01/10/2014 Elsevier Interactive Patient Education  [employer].

## 2017-07-18 LAB — BASIC METABOLIC PANEL
BUN/Creatinine Ratio: 14 (ref 12–28)
BUN: 9 mg/dL (ref 8–27)
CO2: 25 mmol/L (ref 20–29)
CREATININE: 0.66 mg/dL (ref 0.57–1.00)
Calcium: 9.1 mg/dL (ref 8.7–10.3)
Chloride: 95 mmol/L — ABNORMAL LOW (ref 96–106)
GFR, EST AFRICAN AMERICAN: 103 mL/min/{1.73_m2} (ref >59)
GFR, EST NON AFRICAN AMERICAN: 89 mL/min/{1.73_m2} (ref >59)
Glucose: 96 mg/dL (ref 65–99)
POTASSIUM: 5.1 mmol/L (ref 3.5–5.2)
SODIUM: 134 mmol/L (ref 134–144)

## 2017-07-18 LAB — LIPID PANEL
CHOL/HDL RATIO: 1.8 ratio (ref 0.0–4.4)
Cholesterol, Total: 187 mg/dL (ref 100–199)
HDL: 106 mg/dL (ref >39)
LDL CALC: 69 mg/dL (ref 0–99)
TRIGLYCERIDES: 60 mg/dL (ref 0–149)
VLDL Cholesterol Cal: 12 mg/dL (ref 5–40)

## 2017-07-20 ENCOUNTER — Other Ambulatory Visit: Payer: Self-pay | Admitting: Family Medicine

## 2017-07-21 ENCOUNTER — Telehealth: Payer: Self-pay | Admitting: Family Medicine

## 2017-07-21 NOTE — Telephone Encounter (Unsigned)
Copied from CRM [id number]. Topic: Inquiry >> Jul 21, 2017 12:07 PM Raquel SarnaHayes, Teresa G wrote: Pt needs to discuss when can she start back driving?  Please call pt asap.

## 2017-07-21 NOTE — Telephone Encounter (Signed)
Called her back- would advise her not to drive until we get the rest of her cardiac eval done

## 2017-07-23 ENCOUNTER — Telehealth (HOSPITAL_COMMUNITY): Payer: Self-pay | Admitting: *Deleted

## 2017-07-23 NOTE — Telephone Encounter (Signed)
Patient given detailed instructions per Myocardial Perfusion Study Information Sheet for the test on 07/28/17. Patient notified to arrive 15 minutes early and that it is imperative to arrive on time for appointment to keep from having the test rescheduled.  If you need to cancel or reschedule your appointment, please call the office within 24 hours of your appointment. . Patient verbalized understanding.Alexis [name]    

## 2017-07-25 ENCOUNTER — Ambulatory Visit: Payer: Self-pay | Admitting: Internal Medicine

## 2017-07-28 ENCOUNTER — Ambulatory Visit (INDEPENDENT_AMBULATORY_CARE_PROVIDER_SITE_OTHER): Payer: Medicare Other

## 2017-07-28 ENCOUNTER — Ambulatory Visit (HOSPITAL_BASED_OUTPATIENT_CLINIC_OR_DEPARTMENT_OTHER): Payer: Medicare Other

## 2017-07-28 ENCOUNTER — Ambulatory Visit (HOSPITAL_COMMUNITY): Payer: Medicare Other | Attending: Cardiology

## 2017-07-28 VITALS — Ht 60.0 in | Wt 151.0 lb

## 2017-07-28 DIAGNOSIS — R0602 Shortness of breath: Secondary | ICD-10-CM | POA: Diagnosis not present

## 2017-07-28 DIAGNOSIS — R55 Syncope and collapse: Secondary | ICD-10-CM

## 2017-07-28 DIAGNOSIS — I071 Rheumatic tricuspid insufficiency: Secondary | ICD-10-CM | POA: Diagnosis not present

## 2017-07-28 DIAGNOSIS — R079 Chest pain, unspecified: Secondary | ICD-10-CM

## 2017-07-28 DIAGNOSIS — E119 Type 2 diabetes mellitus without complications: Secondary | ICD-10-CM | POA: Diagnosis not present

## 2017-07-28 DIAGNOSIS — E785 Hyperlipidemia, unspecified: Secondary | ICD-10-CM | POA: Insufficient documentation

## 2017-07-28 LAB — MYOCARDIAL PERFUSION IMAGING
CSEPPHR: 79 {beats}/min
LV dias vol: 72 mL (ref 46–106)
LVSYSVOL: 28 mL
RATE: 0.32
Rest HR: 56 {beats}/min
SDS: 4
SRS: 9
SSS: 13
TID: 1.05

## 2017-07-28 LAB — ECHOCARDIOGRAM COMPLETE
HEIGHTINCHES: 60 in
Weight: 2416 oz

## 2017-07-28 MED ORDER — TECHNETIUM TC 99M TETROFOSMIN IV KIT
10.9000 | PACK | Freq: Once | INTRAVENOUS | Status: AC | PRN
  Administered 2017-07-28: 10.9 via INTRAVENOUS
  Filled 2017-07-28: qty 11

## 2017-07-28 MED ORDER — REGADENOSON 0.4 MG/5ML IV SOLN
0.4000 mg | Freq: Once | INTRAVENOUS | Status: AC
  Administered 2017-07-28: 0.4 mg via INTRAVENOUS

## 2017-07-28 MED ORDER — TECHNETIUM TC 99M TETROFOSMIN IV KIT
33.0000 | PACK | Freq: Once | INTRAVENOUS | Status: AC | PRN
  Administered 2017-07-28: 33 via INTRAVENOUS
  Filled 2017-07-28: qty 33

## 2017-07-31 ENCOUNTER — Telehealth: Payer: Self-pay | Admitting: Family Medicine

## 2017-07-31 NOTE — Telephone Encounter (Signed)
Copied from CRM [id number]. Topic: Quick Communication - See Telephone Encounter >> Jul 31, 2017  1:04 PM Waymon AmatoBurton, Donna F wrote: CRM for notification. See Telephone encounter for:  Pt called stating that she would like to talk with Dr. Patsy Lageropland in regards to letting her drive again  Best number [phone] 07/31/17.

## 2017-08-01 NOTE — Telephone Encounter (Signed)
Please give her a call- it looks like all has been ok with her cardiac evaluation so far.  Did she do the 48 hour heart monitor yet?  That is the only thing I don't see.  I am touching base with her cardiologist and will get back with her with my recommendation Thank you

## 2017-08-01 NOTE — Telephone Encounter (Signed)
Spoke to pt, she states that she had the 48 hr heart monitor done this past Wednesday, Feb. 13th.

## 2017-08-05 ENCOUNTER — Telehealth: Payer: Self-pay

## 2017-08-05 NOTE — Telephone Encounter (Signed)
Holter monitor results given.

## 2017-08-05 NOTE — Telephone Encounter (Signed)
Received update from cardiology- her echo and holter are ok.   Her syncopal episode occurred over 3 weeks ago, no further sx and she is feeling well.  Gave her permission to begin driving again, short distances only at first

## 2017-08-11 ENCOUNTER — Encounter: Payer: Self-pay | Admitting: Medical

## 2017-08-11 ENCOUNTER — Ambulatory Visit (INDEPENDENT_AMBULATORY_CARE_PROVIDER_SITE_OTHER): Payer: Medicare Other | Admitting: Medical

## 2017-08-11 VITALS — BP 119/73 | HR 80 | Temp 98.4°F | Resp 16 | Ht 62.0 in | Wt 150.4 lb

## 2017-08-11 DIAGNOSIS — J0100 Acute maxillary sinusitis, unspecified: Secondary | ICD-10-CM | POA: Diagnosis not present

## 2017-08-11 DIAGNOSIS — I6523 Occlusion and stenosis of bilateral carotid arteries: Secondary | ICD-10-CM

## 2017-08-11 DIAGNOSIS — H7292 Unspecified perforation of tympanic membrane, left ear: Secondary | ICD-10-CM | POA: Diagnosis not present

## 2017-08-11 DIAGNOSIS — R059 Cough, unspecified: Secondary | ICD-10-CM

## 2017-08-11 DIAGNOSIS — H6693 Otitis media, unspecified, bilateral: Secondary | ICD-10-CM

## 2017-08-11 DIAGNOSIS — R05 Cough: Secondary | ICD-10-CM | POA: Diagnosis not present

## 2017-08-11 DIAGNOSIS — H6122 Impacted cerumen, left ear: Secondary | ICD-10-CM

## 2017-08-11 MED ORDER — LEVOFLOXACIN 500 MG PO TABS: 500.0000 mg | ORAL_TABLET | Freq: Every day | ORAL | 0 refills | Status: DC

## 2017-08-11 MED ORDER — BENZONATATE 100 MG PO CAPS: 100.0000 mg | ORAL_CAPSULE | Freq: Three times a day (TID) | ORAL | 0 refills | Status: DC | PRN

## 2017-08-11 MED ORDER — FLUTICASONE PROPIONATE 50 MCG/ACT NA SUSP: 2.0000 | Freq: Every day | NASAL | 1 refills | Status: DC

## 2017-08-11 NOTE — Patient Instructions (Addendum)
Do appear to have recent sinus infection and ear infections following preceding nasal congestion.  Right ear tympanic membrane does look obviously infected.  The left TM cannot be visualized due to large amount of wax but by your history it sounds like you had ear infection followed by perforation.  By exam it looks like there is a lot of blood mixed in with the wax on the left side.  For your sinus infection and ear infections, I am prescribing levofloxacin.  Antibiotic choices are limited based on your allergy history.  For cough, I am prescribing benzonatate. For nasal congestion, I am prescribing Flonase.    I am referring you to ears nose and throat with a goal of trying to get you in to see them in about 2-3 weeks.  I think it is best for them to try to remove the wax and for them to assess your tympanic membrane post treatment.  When you find out the date of your ears nose and throat appointment would recommend that you use Debrox over-the-counter eardrops for 3 days to soften the wax on the left side.  That might make removal of the wax easier on the day of visit with ENT.  Follow-up in 7-10 days or as needed.

## 2017-08-11 NOTE — Progress Notes (Signed)
Subjective:    Patient ID: Alexis [name], female    DOB: [birth year], 72 y.o.   MRN: [id number]  HPI  Pt in with some ear pressure/pain since Thursday. No q tips or ear drops used to her ears.  Pt states she had some nasal congestion, st and chest congestion for about one week before ears started to hurt.   Pt states some appearance of blood from her left ear twice since Friday. Pt states she had worse pain then when she saw bloody drainage had less pain. Today pain in left ear appears to be returning.  Review of Systems  Constitutional: Negative for appetite change, chills, fatigue and fever.  HENT: Positive for congestion, ear discharge, ear pain and sore throat. Negative for hearing loss, mouth sores, postnasal drip, sinus pressure and sneezing.   Respiratory: Positive for cough. Negative for shortness of breath and wheezing.   Cardiovascular: Negative for chest pain and palpitations.  Gastrointestinal: Negative for abdominal distention, abdominal pain and blood in stool.  Musculoskeletal: Negative for back pain.  Neurological: Negative for dizziness, weakness, numbness and headaches.  Hematological: Negative for adenopathy. Does not bruise/bleed easily.  Psychiatric/Behavioral: Negative for behavioral problems and confusion.   Past Medical History:  Diagnosis Date  . Anemia   . Back pain   . Bladder infection   . Diabetes mellitus, type II (HCC)   . GERD (gastroesophageal reflux disease)   . Hyperlipidemia   . Memory deficits   . PTSD (post-traumatic stress disorder)   . Thyroid disease      Social History   Socioeconomic History  . Marital status: Unknown    Spouse name: Not on file  . Number of children: Not on file  . Years of education: Not on file  . Highest education level: Not on file  Social Needs  . Financial resource strain: Not very hard  . Food insecurity - worry: Never true  . Food insecurity - inability: Never true  . Transportation needs - medical:  Yes  . Transportation needs - non-medical: Yes  Occupational History  . Not on file  Tobacco Use  . Smoking status: Never Smoker  . Smokeless tobacco: Never Used  Substance and Sexual Activity  . Alcohol use: No    Alcohol/week: 0.0 oz  . Drug use: No  . Sexual activity: No  Other Topics Concern  . Not on file  Social History Narrative  . Not on file    Past Surgical History:  Procedure Laterality Date  . CHOLECYSTECTOMY    . GASTRIC BYPASS    . TONSILLECTOMY      Family History  Problem Relation Age of Onset  . Depression Mother   . Alcohol abuse Father   . Tremor Father     Allergies  Allergen Reactions  . Metformin   . Penicillins     Other reaction(s): HIVES    Current Outpatient Medications on File Prior to Visit  Medication Sig Dispense Refill  . aspirin (ASPIRIN EC) 81 MG EC tablet Take 81 mg by mouth at bedtime. Swallow whole.    . b complex vitamins tablet Take 1 tablet by mouth 2 (two) times daily.    [name] BIOTIN 5000 PO Take 1 tablet by mouth 2 (two) times daily.    . calcium carbonate (CALCIUM 600) 600 MG TABS tablet Take 1 tablet by mouth daily.    . clopidogrel (PLAVIX) 75 MG tablet Take by mouth.    . Cyanocobalamin (VITAMIN B12 TR PO)  Take 500 mcg by mouth at bedtime.     . diphenhydrAMINE (BENADRYL) 25 mg capsule Take 25 mg by mouth 3 (three) times daily.    . ferrous sulfate 325 (65 FE) MG tablet Take by mouth.    . gabapentin (NEURONTIN) 300 MG capsule Take 300 mg by mouth at bedtime.    [name]. glucose blood (ACCU-CHEK ACTIVE STRIPS) test strip Use as instructed- up to once a day.  Dx E11.9 100 each 12  . lamoTRIgine (LAMICTAL) 25 MG tablet Take 2 tablets (50 mg total) daily by mouth. Must see physician for future refills. 180 tablet 1  . levofloxacin (LEVAQUIN) 250 MG tablet Take 1 tablet (250 mg total) by mouth daily. 3 tablet 0  . loperamide (IMODIUM A-D) 2 MG tablet Take 2 mg by mouth as needed.     . meclizine (ANTIVERT) 12.5 MG tablet Take by  mouth.    . metoCLOPramide (REGLAN) 10 MG tablet Take 10 mg by mouth 2 (two) times daily.    [name]. omeprazole (PRILOSEC) 40 MG capsule TAKE ONE CAPSULE BY MOUTH TWICE A DAY 60 capsule 6  . primidone (MYSOLINE) 50 MG tablet Take by mouth 3 (three) times daily.    . Simethicone (GAS RELIEF) 125 MG CAPS Take 125 mg by mouth daily.    [name]. venlafaxine XR (EFFEXOR-XR) 150 MG 24 hr capsule Take 1 capsule (150 mg total) daily by mouth. 90 capsule 1   No current facility-administered medications on file prior to visit.     BP 119/73   Pulse 80   Temp 98.4 F (36.9 C) (Oral)   Resp 16   Ht 5\' 2"  (1.575 m)   Wt 150 lb 6.4 oz (68.2 kg)   SpO2 100%   BMI 27.51 kg/m       Objective:   Physical Exam   General  Mental Status - Alert. General Appearance - Well groomed. Not in acute distress.  Skin Rashes- No Rashes.  HEENT Head- Normal. Ear Auditory Canal - Left-large amount of wax present blocking view of TM and there appears to be blood mixed in with the wax.  Right - Normal.Tympanic Membrane- Left-cannot see any portion of the TM due to the wax.. Right-moderate bright red tm. Eye Sclera/Conjunctiva- Left- Normal. Right- Normal. Nose & Sinuses Nasal Mucosa- Left-  Boggy and Congested. Right-  Boggy and  Congested.Bilateral mild maxillary but no frontal sinus pressure. Mouth & Throat Lips: Upper Lip- Normal: no dryness, cracking, pallor, cyanosis, or vesicular eruption. Lower Lip-Normal: no dryness, cracking, pallor, cyanosis or vesicular eruption. Buccal Mucosa- Bilateral- No Aphthous ulcers. Oropharynx- No Discharge or Erythema. Tonsils: Characteristics- Bilateral- No Erythema or Congestion. Size/Enlargement- Bilateral- No enlargement. Discharge- bilateral-None.  Neck Neck- Supple. No Masses.   Chest and Lung Exam Auscultation: Breath Sounds:-Clear even and unlabored.  Cardiovascular Auscultation:Rythm- Regular, rate and rhythm. Murmurs & Other Heart Sounds:Ausculatation of the  heart reveal- No Murmurs.  Lymphatic Head & Neck General Head & Neck Lymphatics: Bilateral: Description- No Localized lymphadenopathy.      Assessment & Plan:  Do appear to have recent sinus infection and ear infections following preceding nasal congestion.  Right ear tympanic membrane does look obviously infected.  The left TM cannot be visualized due to large amount of wax but by your history it sounds like you had ear infection followed by perforation.  By exam it looks like there is a lot of blood mixed in with the wax on the left side.  For your sinus infection  and ear infections, I am prescribing levofloxacin.  Antibiotic choices are limited based on your allergy history.  For cough, I am prescribing benzonatate. For nasal congestion, I am prescribing Flonase.    I am referring you to ears nose and throat with a goal of trying to get you in to see them in about 2-3 weeks.  I think it is best for them to try to remove the wax and for them to assess your tympanic membrane post treatment.  When you find out the date of your ears nose and throat appointment would recommend that you use Debrox over-the-counter eardrops for 3 days to soften the wax on the left side.  That might make removal of the wax easier on the day of visit with ENT.  Follow-up in 7-10 days or as needed.  Esperanza Richters, PA-C

## 2017-08-14 ENCOUNTER — Encounter: Payer: Self-pay | Admitting: Family Medicine

## 2017-08-14 ENCOUNTER — Ambulatory Visit (INDEPENDENT_AMBULATORY_CARE_PROVIDER_SITE_OTHER): Payer: Medicare Other | Admitting: Family Medicine

## 2017-08-14 ENCOUNTER — Ambulatory Visit: Payer: Self-pay | Admitting: Cardiology

## 2017-08-14 ENCOUNTER — Ambulatory Visit: Payer: Self-pay | Admitting: *Deleted

## 2017-08-14 VITALS — BP 140/82 | HR 83 | Temp 98.7°F | Ht 64.0 in | Wt 152.0 lb

## 2017-08-14 DIAGNOSIS — S0991XA Unspecified injury of ear, initial encounter: Secondary | ICD-10-CM

## 2017-08-14 NOTE — Progress Notes (Signed)
Chief Complaint  Patient presents with  . problem with medication    Pt is here for left ear pain. Duration: 7 days Progression: unchanged  She does admit to using Q-tips in the ear prior to evaluation on 2/25. Associated symptoms: funny feeling in L ear, some bleeding Denies: fevers, hearing loss, or discharge from ear Treatment to date: Levaquin Because she was started on Levaquin, she was told to stop taking her Effexor.  Since that time, starting yesterday she has been having her anxiety symptoms return.  ROS:  HEENT: +ear pain Costitutional: Denies fevers  Past Medical History:  Diagnosis Date  . Anemia   . Back pain   . Bladder infection   . Diabetes mellitus, type II (HCC)   . GERD (gastroesophageal reflux disease)   . Hyperlipidemia   . Memory deficits   . PTSD (post-traumatic stress disorder)   . Thyroid disease    Family History  Problem Relation Age of Onset  . Depression Mother   . Alcohol abuse Father   . Tremor Father    Past Surgical History:  Procedure Laterality Date  . CHOLECYSTECTOMY    . GASTRIC BYPASS    . TONSILLECTOMY      BP 140/82 (BP Location: Left Arm, Patient Position: Sitting, Cuff Size: Normal)   Pulse 83   Temp 98.7 F (37.1 C) (Oral)   Ht 5\' 4"  (1.626 m)   Wt 152 lb (68.9 kg)   SpO2 97%   BMI 26.09 kg/m  General: Awake, alert, appearing stated age HEENT:  L ear- Canal with sanguinous material and wax, I appreciate no purulent material/exudate, TM is neg after removal of material blocking canal R ear- canal patent without drainage or erythema, TM is neg Nose- nares patent and without discharge Mouth- Lips, gums and dentition unremarkable, pharynx is without erythema or exudate Neck: No adenopathy Lungs: Normal effort, no accessory muscle use Psych: Age appropriate judgment and insight, normal mood and affect  Procedure note: Cerumen removal, L Verbal consent obtained. A clear curette with a light source was used to remove  the debris from the ear. Dried and relatively fresh blood was noted. She tolerated the procedure well and noted improvement in her symptoms. There were no immediate complications noted. A cotton ball was placed to prevent blood from getting onto her clothing.  Ear injury, initial encounter  Betadine rinses 3 times a day over the next 7-10 days. Her TM is not perfed. She does not have any evidence of infection, but likely does have trauma from Q tip. Stop Levaquin. OK to go back on venlafaxine in meantime.  F/u prn. Pt voiced understanding and agreement to the plan.  Jilda Rocheicholas Paul PrestonWendling, DO 08/14/17 2:55 PM

## 2017-08-14 NOTE — Patient Instructions (Signed)
Go back on your venlafaxine.  Stop the Levaquin.   Take some iodine/betadine and put it in warm water. Use a bulb syringe to flush your ear out with this mixture 3 times daily.   Let us know if you need anything.

## 2017-08-14 NOTE — Progress Notes (Signed)
Pre visit review using our clinic review tool, if applicable. No additional management support is needed unless otherwise documented below in the visit note. 

## 2017-08-14 NOTE — Telephone Encounter (Signed)
Pt was started on an antibiotic for her cold and congestion. The med was Levaquin. She was told by the pharmacist that she could not take this antibiotic with the venlafaxine. She the patient stop taking the venlafaxine. This was 4 days ago. And now she is feeling very anxious, crying, stating that she feels rotten. She started feeling this way this morning.  She denies wanting to hurt herself and anyone else. But she states that everything is starting to bother her.  Per protocol, see provider within 24 hours. Appointment made for today. Home care advice given to her with verbal understanding. Is she starts feeling worst, she is to go to the ED at [facility].  She voiced understanding.  Reason for Disposition . [1] Depression AND [2] worsening (e.g.,sleeping poorly, less able to do activities of daily living)  Answer Assessment - Initial Assessment Questions 1. CONCERN: "What happened that made you call today?"     Feeling anxious 2. ANXIETY SYMPTOM SCREENING: "Can you describe how you have been feeling?"  (e.g., tense, restless, panicky, anxious, keyed up, trouble sleeping, trouble concentrating)     Anxious, feels "rotten" today, wants to sit and cry 3. ONSET: "How long have you been feeling this way?"     This morning 4. RECURRENT: "Have you felt this way before?"  If yes: "What happened that time?" "What helped these feelings go away in the past?"      no 5. RISK OF HARM - SUICIDAL IDEATION:  "Do you ever have thoughts of hurting or killing yourself?"  (e.g., yes, no, no but preoccupation with thoughts about death)   - INTENT:  "Do you have thoughts of hurting or killing yourself right NOW?" (e.g., yes, no, N/A)   - PLAN: "Do you have a specific plan for how you would do this?" (e.g., gun, knife, overdose, no plan, N/A)     no 6. RISK OF HARM - HOMICIDAL IDEATION:  "Do you ever have thoughts of hurting or killing someone else?"  (e.g., yes, no, no but preoccupation with thoughts  about death)   - INTENT:  "Do you have thoughts of hurting or killing someone right NOW?" (e.g., yes, no, N/A)   - PLAN: "Do you have a specific plan for how you would do this?" (e.g., gun, knife, no plan, N/A)      no 7. FUNCTIONAL IMPAIRMENT: "How have things been going for you overall in your life? Have you had any more difficulties than usual doing your normal daily activities?"  (e.g., better, same, worse; self-care, school, work, interactions)     Things are out of control, likes to be in controlled. Before being off this med, she was in controlled, "cool as a cucumber".  8. SUPPORT: "Who is with you now?" "Who do you live with?" "Do you have family or friends nearby who you can talk to?"      alone 9. THERAPIST: "Do you have a counselor or therapist? Name?"     Had one but have not seen here in a year. She is bi polar. 10. STRESSORS: "Has there been any new stress or recent changes in your life?"       Getting sick and starting on antibiotic. And has wax in left ear and is causing some distress where she is having a hard time hearing. 11. CAFFEINE ABUSE: "Do you drink caffeinated beverages, and how much each day?" (e.g., coffee, tea, colas)       Drinks coffee about 5  cups a day. 12. SUBSTANCE ABUSE: "Do you use any illegal drugs or alcohol?"       no 13. OTHER SYMPTOMS: "Do you have any other physical symptoms right now?" (e.g., chest pain, palpitations, difficulty breathing, fever)       Being worked up for cardiac problems 14. PREGNANCY: "Is there any chance you are pregnant?" "When was your last menstrual period?"       no  Protocols used: DEPRESSION-A-AH, ANXIETY AND PANIC ATTACK-A-AH

## 2017-08-15 ENCOUNTER — Ambulatory Visit (INDEPENDENT_AMBULATORY_CARE_PROVIDER_SITE_OTHER): Payer: Medicare Other | Admitting: Family Medicine

## 2017-08-15 ENCOUNTER — Encounter: Payer: Self-pay | Admitting: Family Medicine

## 2017-08-15 VITALS — BP 112/72 | HR 72 | Temp 98.1°F | Ht 64.0 in | Wt 152.0 lb

## 2017-08-15 DIAGNOSIS — H6983 Other specified disorders of Eustachian tube, bilateral: Secondary | ICD-10-CM | POA: Diagnosis not present

## 2017-08-15 DIAGNOSIS — H60502 Unspecified acute noninfective otitis externa, left ear: Secondary | ICD-10-CM | POA: Diagnosis not present

## 2017-08-15 MED ORDER — CIPROFLOXACIN-DEXAMETHASONE 0.3-0.1 % OT SUSP: 4.0000 [drp] | Freq: Two times a day (BID) | OTIC | 0 refills | Status: DC

## 2017-08-15 MED ORDER — CIPROFLOXACIN HCL 0.2 % OT SOLN: 0.2000 mL | Freq: Two times a day (BID) | OTIC | 0 refills | Status: DC

## 2017-08-15 MED ORDER — PREDNISONE 20 MG PO TABS: 40.0000 mg | ORAL_TABLET | Freq: Every day | ORAL | 0 refills | Status: AC

## 2017-08-15 NOTE — Addendum Note (Signed)
Addended by: Scharlene GlossEWING, ROBIN B on: 08/15/2017 10:35 AM   Modules accepted: Orders

## 2017-08-15 NOTE — Progress Notes (Signed)
Pre visit review using our clinic review tool, if applicable. No additional management support is needed unless otherwise documented below in the visit note. 

## 2017-08-15 NOTE — Progress Notes (Signed)
Chief Complaint  Patient presents with  . Ear Pain    left eyelid swollen    Alexis BoringSandra [name] is a 72 y.o. female here for b/l ear pain.  Duration: 1 day No other URI s/s's.  Drainage of pinkish watery fluid when she has been flushing from L Has been on Levaquin for inner ear infection, was sent to ENT but she cannot afford specialty visit. Denies fevers, URI s/s's, recent Q tip use.   ROS:  Const: No fevers Skin: As noted in HPI  Past Medical History:  Diagnosis Date  . Anemia   . Back pain   . Bladder infection   . Diabetes mellitus, type II (HCC)   . GERD (gastroesophageal reflux disease)   . Hyperlipidemia   . Memory deficits   . PTSD (post-traumatic stress disorder)   . Thyroid disease    Allergies  Allergen Reactions  . Metformin   . Penicillins     Other reaction(s): HIVES     BP 112/72 (BP Location: Left Arm, Patient Position: Sitting, Cuff Size: Normal)   Pulse 72   Temp 98.1 F (36.7 C) (Oral)   Ht 5\' 4"  (1.626 m)   Wt 152 lb (68.9 kg)   SpO2 99%   BMI 26.09 kg/m  Gen: awake, alert, appearing stated age Lungs: No accessory muscle use Ears: R is patent, no drainage, there is an area on floor of canal that is scabbed, TM neg; R is largely patent, blood present with scab further on floor, difficult to clearly visualize TM. After irrigation with water, there appears to be a whitish discharge along with dried blood, TM appears unremarkable.  Psych: Age appropriate judgment and insight  Dysfunction of both eustachian tubes - Plan: predniSONE (DELTASONE) 20 MG tablet  Acute otitis externa of left ear, unspecified type - Plan: Ciprofloxacin HCl 0.2 % otic solution  Orders as above. Pred for ETD, try drops, no other irritation for now. Avoid Qtips as this likely started issue on L.  F/u in 1 week. If no improvement, will refer to ENT.  The patient voiced understanding and agreement to the plan.  Jilda Rocheicholas Paul ImpactWendling, DO 08/15/17 9:29 AM

## 2017-08-15 NOTE — Patient Instructions (Signed)
Cancel appointment next week if you are doing better.  No need to flush your ears anymore.  Let us know if you need anything.

## 2017-08-21 ENCOUNTER — Ambulatory Visit (INDEPENDENT_AMBULATORY_CARE_PROVIDER_SITE_OTHER): Payer: Medicare Other | Admitting: Cardiology

## 2017-08-21 ENCOUNTER — Encounter: Payer: Self-pay | Admitting: Cardiology

## 2017-08-21 VITALS — BP 132/70 | HR 70 | Ht 64.0 in | Wt 151.4 lb

## 2017-08-21 DIAGNOSIS — R55 Syncope and collapse: Secondary | ICD-10-CM | POA: Diagnosis not present

## 2017-08-21 DIAGNOSIS — E119 Type 2 diabetes mellitus without complications: Secondary | ICD-10-CM | POA: Diagnosis not present

## 2017-08-21 NOTE — Patient Instructions (Signed)
Medication Instructions:  Your physician recommends that you continue on your current medications as directed. Please refer to the Current Medication list given to you today.   Labwork: None  Testing/Procedures: None  Follow-Up: Your physician recommends that you schedule a follow-up appointment in: as needed   Any Other Special Instructions Will Be Listed Below (If Applicable).     If you need a refill on your cardiac medications before your next appointment, please call your pharmacy.   [facility]  Brysun Eschmann A, RN, BSN  

## 2017-08-21 NOTE — Progress Notes (Signed)
Cardiology Office Note:    Date:  08/21/2017   ID:  Alexis [name], DOB 11-05-1945, MRN [id number]  PCP:  Pearline Cables, MD  Cardiologist:  Garwin Brothers, MD   Referring MD: Pearline Cables, MD    ASSESSMENT:    1. Bilateral carotid artery stenosis   2. Syncope, unspecified syncope type   3. Type 2 diabetes mellitus without complication, without long-term current use of insulin (HCC)    PLAN:    In order of problems listed above:  1. Primary prevention stressed with the patient.  Importance of compliance with diet and medications stressed and she vocalized understanding.  She has excellent exercise program and I am happy about it.  She has not had any recurrence of the symptoms.  Her blood pressure stable I discussed the findings of the Holter monitor stress test and echocardiogram with her at length and questions were answered to her satisfaction.  She will be seen in follow-up appointment on a as needed basis only.   Medication Adjustments/Labs and Tests Ordered: Current medicines are reviewed at length with the patient today.  Concerns regarding medicines are outlined above.  No orders of the defined types were placed in this encounter.  No orders of the defined types were placed in this encounter.    Chief Complaint  Patient presents with  . Follow-up  . Loss of Consciousness     History of Present Illness:    Alexis [name] is a 72 y.o. female.  The patient has history of diabetes mellitus and experienced a syncopal event.  Her evaluation has come out unremarkable.  She denies any chest pain orthopnea or PND.  She is keeping herself well-hydrated.  She has not had another event since last evaluation.  No chest pain orthopnea or PND.  At the time of my evaluation, the patient is alert awake oriented and in no distress.  She is happy about this.  Past Medical History:  Diagnosis Date  . Anemia   . Back pain   . Bladder infection   . Diabetes mellitus, type  II (HCC)   . GERD (gastroesophageal reflux disease)   . Hyperlipidemia   . Memory deficits   . PTSD (post-traumatic stress disorder)   . Thyroid disease     Past Surgical History:  Procedure Laterality Date  . CHOLECYSTECTOMY    . GASTRIC BYPASS    . TONSILLECTOMY      Current Medications: Current Meds  Medication Sig  . aspirin (ASPIRIN EC) 81 MG EC tablet Take 81 mg by mouth at bedtime. Swallow whole.  . b complex vitamins tablet Take 1 tablet by mouth 2 (two) times daily.  [name] BIOTIN 5000 PO Take 1 tablet by mouth 2 (two) times daily.  . calcium carbonate (CALCIUM 600) 600 MG TABS tablet Take 1 tablet by mouth daily.  . ciprofloxacin-dexamethasone (CIPRODEX) OTIC suspension Place 4 drops into the left ear 2 (two) times daily.  . Cyanocobalamin (VITAMIN B12 TR PO) Take 500 mcg by mouth at bedtime.   . diphenhydrAMINE (BENADRYL) 25 mg capsule Take 25 mg by mouth 3 (three) times daily.  . ferrous sulfate 325 (65 FE) MG tablet Take by mouth.  . fluticasone (FLONASE) 50 MCG/ACT nasal spray Place 2 sprays into both nostrils daily.  [name] gabapentin (NEURONTIN) 300 MG capsule Take 300 mg by mouth at bedtime.  [name] glucose blood (ACCU-CHEK ACTIVE STRIPS) test strip Use as instructed- up to once a day.  Dx E11.9  .  lamoTRIgine (LAMICTAL) 25 MG tablet Take 2 tablets (50 mg total) daily by mouth. Must see physician for future refills.  [name] loperamide (IMODIUM A-D) 2 MG tablet Take 2 mg by mouth as needed.   . meclizine (ANTIVERT) 12.5 MG tablet Take by mouth.  . metoCLOPramide (REGLAN) 10 MG tablet Take 10 mg by mouth 2 (two) times daily.  [name] omeprazole (PRILOSEC) 40 MG capsule TAKE ONE CAPSULE BY MOUTH TWICE A DAY  . primidone (MYSOLINE) 50 MG tablet Take by mouth 3 (three) times daily.  . Simethicone (GAS RELIEF) 125 MG CAPS Take 125 mg by mouth daily.  [name] venlafaxine XR (EFFEXOR-XR) 150 MG 24 hr capsule Take 1 capsule (150 mg total) daily by mouth.  . [DISCONTINUED] Ciprofloxacin HCl 0.2 % otic  solution Place 0.2 mLs into the left ear 2 (two) times daily.     Allergies:   Metformin and Penicillins   Social History   Socioeconomic History  . Marital status: Unknown    Spouse name: None  . Number of children: None  . Years of education: None  . Highest education level: None  Social Needs  . Financial resource strain: Not very hard  . Food insecurity - worry: Never true  . Food insecurity - inability: Never true  . Transportation needs - medical: Yes  . Transportation needs - non-medical: Yes  Occupational History  . None  Tobacco Use  . Smoking status: Never Smoker  . Smokeless tobacco: Never Used  Substance and Sexual Activity  . Alcohol use: No    Alcohol/week: 0.0 oz  . Drug use: No  . Sexual activity: No  Other Topics Concern  . None  Social History Narrative  . None     Family History: The patient's family history includes Alcohol abuse in her father; Depression in her mother; Tremor in her father.  ROS:   Please see the history of present illness.    All other systems reviewed and are negative.  EKGs/Labs/Other Studies Reviewed:    The following studies were reviewed today: I discussed the findings of the echocardiogram Holter monitoring and stress test with the patient at extensive length and questions were answered to her satisfaction.   Recent Labs: 10/24/2016: TSH 0.79 07/14/2017: ALT 14; Hemoglobin 12.8; Platelets 210.0 07/17/2017: BUN 9; Creatinine, Ser 0.66; Potassium 5.1; Sodium 134  Recent Lipid Panel    Component Value Date/Time   CHOL 187 07/17/2017 1047   TRIG 60 07/17/2017 1047   HDL 106 07/17/2017 1047   CHOLHDL 1.8 07/17/2017 1047   LDLCALC 69 07/17/2017 1047    Physical Exam:    VS:  BP 132/70 (BP Location: Left Arm, Patient Position: Sitting, Cuff Size: Normal)   Pulse 70   Ht 5\' 4"  (1.626 m)   Wt 151 lb 6.4 oz (68.7 kg)   SpO2 99%   BMI 25.99 kg/m     Wt Readings from Last 3 Encounters:  08/21/17 151 lb 6.4 oz (68.7  kg)  08/15/17 152 lb (68.9 kg)  08/14/17 152 lb (68.9 kg)     GEN: Patient is in no acute distress HEENT: Normal NECK: No JVD; No carotid bruits LYMPHATICS: No lymphadenopathy CARDIAC: Hear sounds regular, 2/6 systolic murmur at the apex. RESPIRATORY:  Clear to auscultation without rales, wheezing or rhonchi  ABDOMEN: Soft, non-tender, non-distended MUSCULOSKELETAL:  No edema; No deformity  SKIN: Warm and dry NEUROLOGIC:  Alert and oriented x 3 PSYCHIATRIC:  Normal affect   Signed, Garwin Brothers, MD  08/21/2017 10:47 AM    [facility]

## 2017-08-22 ENCOUNTER — Ambulatory Visit: Payer: Self-pay | Admitting: Family Medicine

## 2017-09-05 ENCOUNTER — Encounter: Payer: Self-pay | Admitting: Internal Medicine

## 2017-09-05 ENCOUNTER — Ambulatory Visit (INDEPENDENT_AMBULATORY_CARE_PROVIDER_SITE_OTHER): Payer: Medicare Other | Admitting: Internal Medicine

## 2017-09-05 VITALS — BP 118/60 | HR 64 | Ht 63.0 in | Wt 149.1 lb

## 2017-09-05 DIAGNOSIS — I6523 Occlusion and stenosis of bilateral carotid arteries: Secondary | ICD-10-CM

## 2017-09-05 DIAGNOSIS — R1319 Other dysphagia: Secondary | ICD-10-CM | POA: Diagnosis not present

## 2017-09-05 DIAGNOSIS — R143 Flatulence: Secondary | ICD-10-CM

## 2017-09-05 NOTE — Progress Notes (Signed)
Alexis BoringSandra Kuriakose 72 y.o. 1945/07/24 [id number]  Assessment & Plan:   Encounter Diagnoses  Name Primary?  Alexis [name]. Dysphagia Yes  . Flatulence    Needs EGD and possible dilation to evaluate and treat  dysphagia and clear up this Barrett's dx issue ( do not think she actually has Barrett's)  Align daily for gas/flatulence  I have recommended she have a colonoscopy as the procedure as documented was incomplete but she has declined. After she left office I realized that not only was prep inadequate but cecum not reached - documentation was difficult to sort through. That was buried in body of report.  Will discuss further at EGD  I appreciate the opportunity to care for her  XL:[id number]:[name], [name], MD   Subjective:   Chief Complaint: Dysphagia  HPI Self-referred 72 yo ww w/ intermittent solid and some liquid dysphagia w/ a hx EGD (Dr. Noe GensPeters) 10/17 w/ reported Barrett's esophagus but bxs of gastric cardia on path w/o intestinal metaplasia and s/p gastric bypass. No dilation then - had dysphagia then.  Frustrated that she still has problems after this evaluation.  Also had a colonoscopy that indicates inadequate prep and colonoscopy only advanced to the hepatic flexure due to looping and inadequate prep and repeat colonoscopy recommended in 1 year    Has embarrassing flatus in church  GI ROS o/w negative Allergies  Allergen Reactions  . Metformin   . Penicillins     Other reaction(s): HIVES   Current Meds  Medication Sig  . aspirin (ASPIRIN EC) 81 MG EC tablet Take 81 mg by mouth at bedtime. Swallow whole.  . b complex vitamins tablet Take 1 tablet by mouth 2 (two) times daily.  Alexis [name]. BIOTIN 5000 PO Take 1 tablet by mouth 2 (two) times daily.  . calcium carbonate (CALCIUM 600) 600 MG TABS tablet Take 1 tablet by mouth daily.  . Cyanocobalamin (VITAMIN B12 TR PO) Take 500 mcg by mouth at bedtime.   . diphenhydrAMINE (BENADRYL) 25 mg capsule Take 25 mg by mouth 3 (three) times  daily.  . fluticasone (FLONASE) 50 MCG/ACT nasal spray Place 2 sprays into both nostrils daily. (Patient taking differently: Place 2 sprays into both nostrils as needed. )  . gabapentin (NEURONTIN) 300 MG capsule Take 300 mg by mouth at bedtime.  Alexis [name]. glucose blood (ACCU-CHEK ACTIVE STRIPS) test strip Use as instructed- up to once a day.  Dx E11.9  . lamoTRIgine (LAMICTAL) 25 MG tablet Take 2 tablets (50 mg total) daily by mouth. Must see physician for future refills.  Alexis [name]. loperamide (IMODIUM A-D) 2 MG tablet Take 2 mg by mouth as needed.   . meclizine (ANTIVERT) 12.5 MG tablet Take by mouth.  . metoCLOPramide (REGLAN) 10 MG tablet Take 10 mg by mouth 2 (two) times daily.  Alexis [name]. omeprazole (PRILOSEC) 40 MG capsule TAKE ONE CAPSULE BY MOUTH TWICE A DAY  . primidone (MYSOLINE) 50 MG tablet Take by mouth 3 (three) times daily.  . Simethicone (GAS RELIEF) 125 MG CAPS Take 125 mg by mouth daily.  Alexis [name]. venlafaxine XR (EFFEXOR-XR) 150 MG 24 hr capsule Take 1 capsule (150 mg total) daily by mouth.   Past Medical History:  Diagnosis Date  . Anemia   . Arthritis   . Asthma   . Back pain   . Barrett's esophagus   . Bladder infection   . Depression   . Diabetes mellitus, type II (HCC)   . Gallstones   . Gastritis   . GERD (gastroesophageal  reflux disease)   . Hyperlipidemia   . Memory deficits   . PTSD (post-traumatic stress disorder)   . Thyroid disease    Past Surgical History:  Procedure Laterality Date  . CHOLECYSTECTOMY  1999  . COLONOSCOPY    . ESOPHAGOGASTRODUODENOSCOPY    . GANGLION CYST EXCISION Left   . GASTRIC BYPASS  2000  . TONSILLECTOMY AND ADENOIDECTOMY     age 27   Social History   Social History Narrative   Married second time after a divorce   1 son and 1 daughter living 1 daughter was stillborn   3 caffeine (coffee)/day   Retired from retail   family history includes Alcohol abuse in her father; Depression in her mother; Diabetes in her paternal grandmother; Heart disease  in her father and mother; Kidney disease in her mother; Other in her daughter and father; Prostate cancer in her father; Tremor in her father.   Review of Systems Allergies, decreased hearing, urine leakage, fatigue, depressed mood, cataracts, back pain, anxious mood  All other ROS negative  Objective:   Physical Exam @BP  118/60 (BP Location: Left Arm, Patient Position: Sitting, Cuff Size: Normal)   Pulse 64   Ht 5\' 3"  (1.6 m) Comment: height measured without shoes  Wt 149 lb 2 oz (67.6 kg)   BMI 26.42 kg/m @  General:  Well-developed, well-nourished and in no acute distress Eyes:  anicteric. ENT:   Poor dentition Neck:   supple w/o thyromegaly or mass.  Lungs: Clear to auscultation bilaterally. Heart:  S1S2, no rubs, murmurs, gallops. Abdomen:  soft, non-tender, no hepatosplenomegaly, hernia, or mass and BS+. Some loose skin and upper midline vertical scar  Lymph:  no cervical or supraclavicular adenopathy. Extremities:   no edema, cyanosis or clubbing Skin   no rash. Neuro:  A&O x 3.  Psych:  appropriate mood and  Affect.   Data Reviewed: See HPI

## 2017-09-05 NOTE — Patient Instructions (Addendum)
You have been scheduled for an endoscopy. Please follow written instructions given to you at your visit today. If you use inhalers (even only as needed), please bring them with you on the day of your procedure.  Purchase over the counter align and take one daily.  I appreciate the opportunity to care for you. Stan Headarl Gessner, MD, [facility]

## 2017-09-07 ENCOUNTER — Encounter: Payer: Self-pay | Admitting: Internal Medicine

## 2017-10-18 ENCOUNTER — Other Ambulatory Visit (HOSPITAL_COMMUNITY): Payer: Self-pay | Admitting: Psychiatry

## 2017-10-18 DIAGNOSIS — F331 Major depressive disorder, recurrent, moderate: Secondary | ICD-10-CM

## 2017-10-20 ENCOUNTER — Ambulatory Visit (INDEPENDENT_AMBULATORY_CARE_PROVIDER_SITE_OTHER): Payer: Medicare Other | Admitting: Psychiatry

## 2017-10-20 ENCOUNTER — Encounter (HOSPITAL_COMMUNITY): Payer: Self-pay | Admitting: Psychiatry

## 2017-10-20 DIAGNOSIS — Z91419 Personal history of unspecified adult abuse: Secondary | ICD-10-CM

## 2017-10-20 DIAGNOSIS — F331 Major depressive disorder, recurrent, moderate: Secondary | ICD-10-CM

## 2017-10-20 DIAGNOSIS — I6523 Occlusion and stenosis of bilateral carotid arteries: Secondary | ICD-10-CM | POA: Diagnosis not present

## 2017-10-20 DIAGNOSIS — Z818 Family history of other mental and behavioral disorders: Secondary | ICD-10-CM | POA: Diagnosis not present

## 2017-10-20 DIAGNOSIS — Z915 Personal history of self-harm: Secondary | ICD-10-CM

## 2017-10-20 DIAGNOSIS — Z811 Family history of alcohol abuse and dependence: Secondary | ICD-10-CM | POA: Diagnosis not present

## 2017-10-20 MED ORDER — LAMOTRIGINE 25 MG PO TABS: 75.0000 mg | ORAL_TABLET | Freq: Every day | ORAL | 0 refills | Status: DC

## 2017-10-20 MED ORDER — VENLAFAXINE HCL ER 150 MG PO CP24: 150.0000 mg | ORAL_CAPSULE | Freq: Every day | ORAL | 0 refills | Status: DC
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
## 2017-11-02 ENCOUNTER — Other Ambulatory Visit: Payer: Self-pay | Admitting: Family Medicine

## 2017-11-04 ENCOUNTER — Encounter: Payer: Self-pay | Admitting: Internal Medicine

## 2017-11-04 ENCOUNTER — Other Ambulatory Visit: Payer: Self-pay

## 2017-11-04 ENCOUNTER — Ambulatory Visit (AMBULATORY_SURGERY_CENTER): Payer: Medicare Other | Admitting: Internal Medicine

## 2017-11-04 VITALS — BP 134/48 | HR 52 | Temp 97.1°F | Resp 13 | Ht 63.0 in | Wt 149.0 lb

## 2017-11-04 DIAGNOSIS — R1319 Other dysphagia: Secondary | ICD-10-CM

## 2017-11-04 DIAGNOSIS — R1310 Dysphagia, unspecified: Secondary | ICD-10-CM | POA: Diagnosis not present

## 2017-11-04 DIAGNOSIS — K229 Disease of esophagus, unspecified: Secondary | ICD-10-CM | POA: Diagnosis not present

## 2017-11-04 DIAGNOSIS — K2270 Barrett's esophagus without dysplasia: Secondary | ICD-10-CM | POA: Diagnosis not present

## 2017-11-04 MED ORDER — SODIUM CHLORIDE 0.9 % IV SOLN: 500.0000 mL | Freq: Once | INTRAVENOUS | Status: DC

## 2017-11-04 NOTE — Op Note (Signed)
[facility] Patient Name: Alexis [name] Procedure Date: 11/04/2017 10:32 AM MRN: [id number] Endoscopist: Iva Boop , MD Age: 72 Referring MD:  Date of Birth: 1946/04/02 Gender: Female Account #: [id number] Procedure:                Upper GI endoscopy Indications:              Dysphagia, Follow-up of Barrett's esophagus Medicines:                Propofol per Anesthesia, Monitored Anesthesia Care Procedure:                Pre-Anesthesia Assessment:                           - Prior to the procedure, a History and Physical                            was performed, and patient medications and                            allergies were reviewed. The patient's tolerance of                            previous anesthesia was also reviewed. The risks                            and benefits of the procedure and the sedation                            options and risks were discussed with the patient.                            All questions were answered, and informed consent                            was obtained. Prior Anticoagulants: The patient has                            taken no previous anticoagulant or antiplatelet                            agents. ASA Grade Assessment: II - A patient with                            mild systemic disease. After reviewing the risks                            and benefits, the patient was deemed in                            satisfactory condition to undergo the procedure.                           After obtaining informed consent, the endoscope was  passed under direct vision. Throughout the                            procedure, the patient's blood pressure, pulse, and                            oxygen saturations were monitored continuously. The                            Endoscope was introduced through the mouth, and                            advanced to the afferent and efferent jejunal            loops. The upper GI endoscopy was accomplished                            without difficulty. The patient tolerated the                            procedure well. Scope In: Scope Out: Findings:                 There were esophageal mucosal changes suggestive of                            short-segment Barrett's esophagus present in the                            distal esophagus. The maximum longitudinal extent                            of these mucosal changes was 1-2 cm in length.                            Mucosa was biopsied with a cold forceps for                            histology from 35 to 37 cm from the incisors. One                            specimen bottle was sent to pathology. Verification                            of patient identification for the specimen was                            done. Estimated blood loss was minimal.                           Evidence of a gastric bypass was found. A gastric                            pouch with a 7 cm length from the GE junction to  the gastrojejunal anastomosis was found. The                            gastrojejunal anastomosis was characterized by                            healthy appearing mucosa. This was traversed. The                            pouch-to-jejunum limb was characterized by healthy                            appearing mucosa.                           The examined jejunum was normal.                           The cardia and gastric fundus were normal on                            retroflexion.                           The scope was withdrawn. Dilation was performed in                            the entire esophagus with a Maloney dilator with                            mild resistance at 54 Fr to about 35 cm as GE                            junction was patent Estimated blood loss: none. Complications:            No immediate complications. Estimated Blood Loss:      Estimated blood loss was minimal. Impression:               - Esophageal mucosal changes suggestive of                            short-segment Barrett's esophagus. Biopsied.                           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                            Gastrojejunal anastomosis characterized by healthy                            appearing mucosa. 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                           Esophagus dilated Recommendation:           - Clear liquids x 1 hour then soft foods rest of                            day. Start prior diet tomorrow.                           - Await pathology results.                           - Patient has a contact number available for                            emergencies. The signs and symptoms of potential                            delayed complications were discussed with the                            patient. Return to normal activities tomorrow.                            Written discharge instructions were provided to the                            patient.                           - Continue present medications.                           - will contact her re: colon cancer screening                            options (last colonoscopy poor prep)                           ? needs SIBO testing for gas sxs Iva Boop, MD 11/04/2017 11:00:47 AM This report has been signed electronically.

## 2017-11-04 NOTE — Patient Instructions (Addendum)
I took biopsies at the end of the esophagus to clarify the Barrett's esophagus issue.  I dilated the esophagus.  All else ok s/p gastric bypass.  Will contact you with biopsy results, treatment and f/u plans.  Your prior bypass surgery does predispose you to something called small intestinal bacterial overgrowth (SIBO) which could be responsible for your gas problems.  We could test for that or just treat with antibiotics.  Will discuss.  I appreciate the opportunity to care for you. Iva Boop, MD, FACG    YOU HAD AN ENDOSCOPIC PROCEDURE TODAY AT THE [facility]:   Refer to the procedure report that was given to you for any specific questions about what was found during the examination.  If the procedure report does not answer your questions, please call your gastroenterologist to clarify.  If you requested that your care partner not be given the details of your procedure findings, then the procedure report has been included in a sealed envelope for you to review at your convenience later.  YOU SHOULD EXPECT: Some feelings of bloating in the abdomen. Passage of more gas than usual.  Walking can help get rid of the air that was put into your GI tract during the procedure and reduce the bloating. If you had a lower endoscopy (such as a colonoscopy or flexible sigmoidoscopy) you may notice spotting of blood in your stool or on the toilet paper. If you underwent a bowel prep for your procedure, you may not have a normal bowel movement for a few days.  Please Note:  You might notice some irritation and congestion in your nose or some drainage.  This is from the oxygen used during your procedure.  There is no need for concern and it should clear up in a day or so.  SYMPTOMS TO REPORT IMMEDIATELY:   Following upper endoscopy (EGD)  Vomiting of blood or coffee ground material  New chest pain or pain under the shoulder blades  Painful or persistently difficult  swallowing  New shortness of breath  Fever of 100F or higher  Black, tarry-looking stools  For urgent or emergent issues, a gastroenterologist can be reached at any hour by calling [phone].   DIET:  Please follow the dilatation diet the rest of today.  Handout was given to your care partner.  Drink plenty of fluids but you should avoid alcoholic beverages for 24 hours.  ACTIVITY:  You should plan to take it easy for the rest of today and you should NOT DRIVE or use heavy machinery until tomorrow (because of the sedation medicines used during the test).    FOLLOW UP: Our staff will call the number listed on your records the next business day following your procedure to check on you and address any questions or concerns that you may have regarding the information given to you following your procedure. If we do not reach you, we will leave a message.  However, if you are feeling well and you are not experiencing any problems, there is no need to return our call.  We will assume that you have returned to your regular daily activities without incident.  If any biopsies were taken you will be contacted by phone or by letter within the next 1-3 weeks.  Please call us at [phone] if you have not heard about the biopsies in 3 weeks.    SIGNATURES/CONFIDENTIALITY: You and/or your care partner have signed paperwork which will be entered into your  electronic medical record.  These signatures attest to the fact that that the information above on your After Visit Summary has been reviewed and is understood.  Full responsibility of the confidentiality of this discharge information lies with you and/or your care-partner.   Handouts were given to your care partner on the dilatation diet to follow the rest of today. You may resume your current medications today. Await biopsy results. Please call if any questions or concerns.

## 2017-11-04 NOTE — Progress Notes (Signed)
Called to room to assist during endoscopic procedure.  Patient ID and intended procedure confirmed with present staff. Received instructions for my participation in the procedure from the performing physician.  

## 2017-11-04 NOTE — Progress Notes (Signed)
No problems noted in the recovery room. maw 

## 2017-11-04 NOTE — Progress Notes (Signed)
Spontaneous respirations throughout. VSS. Resting comfortably. To PACU on room air. Report to  RN. 

## 2017-11-05 ENCOUNTER — Other Ambulatory Visit (HOSPITAL_COMMUNITY): Payer: Self-pay | Admitting: Psychiatry

## 2017-11-05 ENCOUNTER — Telehealth: Payer: Self-pay

## 2017-11-05 DIAGNOSIS — F331 Major depressive disorder, recurrent, moderate: Secondary | ICD-10-CM

## 2017-11-05 NOTE — Telephone Encounter (Signed)
  Follow up Call-  Call back number 11/04/2017  Post procedure Call Back phone  # [phone]  Permission to leave phone message Yes  Some recent data might be hidden     Patient questions:  Do you have a fever, pain , or abdominal swelling? No. Pain Score  0 *  Have you tolerated food without any problems? Yes.    Have you been able to return to your normal activities? Yes.    Do you have any questions about your discharge instructions: Diet   No. Medications  No. Follow up visit  No.  Do you have questions or concerns about your Care? No.  Actions: * If pain score is 4 or above: No action needed, pain <4.

## 2017-11-23 ENCOUNTER — Other Ambulatory Visit (HOSPITAL_COMMUNITY): Payer: Self-pay | Admitting: Psychiatry

## 2017-11-23 DIAGNOSIS — F331 Major depressive disorder, recurrent, moderate: Secondary | ICD-10-CM

## 2017-11-24 ENCOUNTER — Ambulatory Visit (INDEPENDENT_AMBULATORY_CARE_PROVIDER_SITE_OTHER): Payer: Medicare Other | Admitting: Internal Medicine

## 2017-11-24 ENCOUNTER — Encounter: Payer: Self-pay | Admitting: Internal Medicine

## 2017-11-24 VITALS — BP 100/60 | HR 72 | Ht 63.0 in | Wt 149.0 lb

## 2017-11-24 DIAGNOSIS — R1310 Dysphagia, unspecified: Secondary | ICD-10-CM

## 2017-11-24 DIAGNOSIS — Z1211 Encounter for screening for malignant neoplasm of colon: Secondary | ICD-10-CM | POA: Diagnosis not present

## 2017-11-24 DIAGNOSIS — I6523 Occlusion and stenosis of bilateral carotid arteries: Secondary | ICD-10-CM

## 2017-11-24 DIAGNOSIS — K2270 Barrett's esophagus without dysplasia: Secondary | ICD-10-CM | POA: Diagnosis not present

## 2017-11-24 DIAGNOSIS — R143 Flatulence: Secondary | ICD-10-CM | POA: Diagnosis not present

## 2017-11-24 DIAGNOSIS — R1319 Other dysphagia: Secondary | ICD-10-CM

## 2017-11-24 NOTE — Progress Notes (Signed)
Alexis [name] 72 y.o. July 08, 1945 [id number]  Assessment & Plan:   Encounter Diagnoses  Name Primary?  . Esophageal dysphagia Yes  . Barrett's esophagus without dysplasia   . Colon cancer screening   . Flatulence     She is better on all fronts, dysphagia is resolved and the flatulence is better with a probiotic.  I have explained what Barrett's esophagus is and why she may need a follow-up in 5 years, at 59 it might not make sense.  We will plan to look at things then and make a decision.  She has agreed to schedule a colonoscopy because previous colonoscopies by Dr. Noe Gens were incomplete.  He did not reach the cecum apparently she had a barium enema, the prep was poor and that may have occurred on other procedures as well.  The risks and benefits as well as alternatives of endoscopic procedure(s) have been discussed and reviewed. All questions answered. The patient agrees to proceed.    Subjective:   Chief Complaint:  HPI S/P EGD and Maloney dilation 11/04/2017 Dysphagia resolved + Barrett's esophagus - recall 2024 Gas is better on probiotic Has not had a complete screening colonoscopy   Allergies  Allergen Reactions  . Metformin   . Penicillins     Other reaction(s): HIVES   Current Meds  Medication Sig  . aspirin (ASPIRIN EC) 81 MG EC tablet Take 81 mg by mouth at bedtime. Swallow whole.  . b complex vitamins tablet Take 1 tablet by mouth 2 (two) times daily.  [name] BIOTIN 5000 PO Take 1 tablet by mouth 2 (two) times daily.  . calcium carbonate (CALCIUM 600) 600 MG TABS tablet Take 1 tablet by mouth daily.  . Cyanocobalamin (VITAMIN B12 TR PO) Take 500 mcg by mouth at bedtime.   . diphenhydrAMINE (BENADRYL) 25 mg capsule Take 25 mg by mouth 3 (three) times daily.  . fluticasone (FLONASE) 50 MCG/ACT nasal spray Place 2 sprays into both nostrils daily. (Patient taking differently: Place 2 sprays into both nostrils as needed. )  . gabapentin (NEURONTIN) 300 MG capsule  Take 300 mg by mouth at bedtime.  [name] glucose blood (ACCU-CHEK ACTIVE STRIPS) test strip Use as instructed- up to once a day.  Dx E11.9  . lamoTRIgine (LAMICTAL) 25 MG tablet Take 3 tablets (75 mg total) by mouth daily.  [name] loperamide (IMODIUM A-D) 2 MG tablet Take 2 mg by mouth as needed.   . meclizine (ANTIVERT) 12.5 MG tablet Take by mouth.  . metoCLOPramide (REGLAN) 10 MG tablet TAKE 1 TABLET BY MOUTH TWICE A DAY  . omeprazole (PRILOSEC) 40 MG capsule TAKE ONE CAPSULE BY MOUTH TWICE A DAY  . primidone (MYSOLINE) 50 MG tablet Take by mouth 3 (three) times daily.  . Simethicone (GAS RELIEF) 125 MG CAPS Take 125 mg by mouth daily.  [name] venlafaxine XR (EFFEXOR-XR) 150 MG 24 hr capsule Take 1 capsule (150 mg total) by mouth daily.   Past Medical History:  Diagnosis Date  . Anemia   . Arthritis   . Asthma   . Back pain   . Barrett's esophagus   . Bladder infection   . Depression   . Diabetes mellitus, type II (HCC)   . Gallstones   . Gastritis   . GERD (gastroesophageal reflux disease)   . Hyperlipidemia   . Memory deficits   . PTSD (post-traumatic stress disorder)   . Thyroid disease    Past Surgical History:  Procedure Laterality Date  . CHOLECYSTECTOMY  1999  . COLONOSCOPY    . ESOPHAGOGASTRODUODENOSCOPY    . GANGLION CYST EXCISION Left   . GASTRIC BYPASS  2000  . TONSILLECTOMY AND ADENOIDECTOMY     age 72   Review of Systems As above  Objective:   Physical Exam BP 100/60 (BP Location: Left Arm, Patient Position: Sitting, Cuff Size: Normal)   Pulse 72   Ht 5\' 3"  (1.6 m)   Wt 149 lb (67.6 kg)   BMI 26.39 kg/m  NAD  15 minutes time spent with patient > half in counseling coordination of care

## 2017-11-24 NOTE — Patient Instructions (Signed)
   We are putting you in for a colon recall for August 2019 per Dr Leone PayorGessner.    I appreciate the opportunity to care for you. Stan Headarl Gessner, MD, [facility]

## 2017-11-29 NOTE — Progress Notes (Signed)
[facility] [address], [address] [place], [place] [address] [phone] [phone]  Date:  12/01/2017   Name:  Alexis BoringSandra [name]   DOB:  [birth year]   MRN:  [id number]  PCP:  Alexis Cablesopland, Jessica C, MD    Chief Complaint: No chief complaint on file.   History of Present Illness:  Alexis [name] is a 72 y.o. very pleasant female patient who presents with the following:  Here today for a 6 month visit History of diet controlled DM,hyperlipidemia, gastric bypass Last seen by myself in January at which time she had experienced syncope.  She saw cardiology for follow-up  1. Bilateral carotid artery stenosis   2. Syncope, unspecified syncope type   3. Type 2 diabetes mellitus without complication, without long-term current use of insulin (HCC)    PLAN:    In order of problems listed above: 1. Primary prevention stressed with the patient.  Importance of compliance with diet and medications stressed and she vocalized understanding.  She has excellent exercise program and I am happy about it.  She has not had any recurrence of the symptoms.  Her blood pressure stable I discussed the findings of the Holter monitor stress test and echocardiogram with her at length and questions were answered to her satisfaction.  She will be seen in follow-up appointment on a as needed basis only.  Foot exam: done today  Eye exam: will be done today Urine micro needed Hep [name] screening needed   Lab Results  Component Value Date   HGBA1C 5.3 07/14/2017   She had impacted cerumen earlier this year, which was removed here in the office - would like me to check and make sure her ears look ok today No further syncope She did see her GI doctor- had an upper GI, she is also going to do a colonoscopy soon  I will arrange a bone density for her  She had one pneumonia vaccine at age 72 she thinks - we assume pneumovax 23- would like booster We will give her prevnar 5113 today    She had the measles as a child  She would like to have a tdap today as well Patient Active Problem List   Diagnosis Date Noted  . Syncope 07/17/2017  . Screening for hyperlipidemia 07/17/2017  . Shortness of breath 07/17/2017  . Lichen simplex chronicus 03/05/2017  . Type 2 diabetes mellitus without complication, without long-term current use of insulin (HCC) 03/05/2017  . Perianal lesion 01/31/2017  . Bilateral carotid artery stenosis 12/12/2016  . Restless legs 12/11/2016  . History of gastric bypass 12/11/2016  . PTSD (post-traumatic stress disorder) 12/11/2016  . 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 Risk for falls 12/10/2016  . Other dysphagia 08/19/2016  . Right foot drop 08/19/2016  . Women's annual routine gynecological examination 01/16/2016  . Chronic vulvitis 12/25/2015  . Microcalcification of left breast on mammogram 05/11/2015  . Tension type headache 06/14/2013  . Hypersomnia 04/13/2013    Past Medical History:  Diagnosis Date  . Anemia   . Arthritis   . Asthma   . Back pain   . Barrett's esophagus   . Bladder infection   . Depression   . Diabetes mellitus, type II (HCC)   . Gallstones   . Gastritis   . GERD (gastroesophageal reflux disease)   . Hyperlipidemia   . Memory deficits   . PTSD (post-traumatic stress disorder)   . Thyroid disease  Past Surgical History:  Procedure Laterality Date  . CHOLECYSTECTOMY  1999  . COLONOSCOPY    . ESOPHAGOGASTRODUODENOSCOPY    . GANGLION CYST EXCISION Left   . GASTRIC BYPASS  2000  . TONSILLECTOMY AND ADENOIDECTOMY     age 19    Social History   Tobacco Use  . Smoking status: Never Smoker  . Smokeless tobacco: Never Used  Substance Use Topics  . Alcohol use: No    Alcohol/week: 0.0 oz  . Drug use: No    Family History  Problem Relation Age of Onset  . Depression Mother   . Heart disease Mother   . Kidney disease Mother   . Alcohol abuse Father   . Tremor Father   . Prostate cancer  Father   . Other Father        Hypoglycemia  . Heart disease Father   . Diabetes Paternal Grandmother   . Other Daughter        stillborn    Allergies  Allergen Reactions  . Metformin   . Penicillins     Other reaction(s): HIVES    Medication list has been reviewed and updated.  Current Outpatient Medications on File Prior to Visit  Medication Sig Dispense Refill  . aspirin (ASPIRIN EC) 81 MG EC tablet Take 81 mg by mouth at bedtime. Swallow whole.    . b complex vitamins tablet Take 1 tablet by mouth 2 (two) times daily.    [name] BIOTIN 5000 PO Take 1 tablet by mouth 2 (two) times daily.    . calcium carbonate (CALCIUM 600) 600 MG TABS tablet Take 1 tablet by mouth daily.    . Cyanocobalamin (VITAMIN B12 TR PO) Take 500 mcg by mouth at bedtime.     . diphenhydrAMINE (BENADRYL) 25 mg capsule Take 25 mg by mouth 3 (three) times daily.    . fluticasone (FLONASE) 50 MCG/ACT nasal spray Place 2 sprays into both nostrils daily. (Patient taking differently: Place 2 sprays into both nostrils as needed. ) 16 g 1  . gabapentin (NEURONTIN) 300 MG capsule Take 300 mg by mouth at bedtime.    [name] glucose blood (ACCU-CHEK ACTIVE STRIPS) test strip Use as instructed- up to once a day.  Dx E11.9 100 each 12  . lamoTRIgine (LAMICTAL) 25 MG tablet Take 3 tablets (75 mg total) by mouth daily. 270 tablet 0  . loperamide (IMODIUM A-D) 2 MG tablet Take 2 mg by mouth as needed.     . meclizine (ANTIVERT) 12.5 MG tablet Take by mouth.    . metoCLOPramide (REGLAN) 10 MG tablet TAKE 1 TABLET BY MOUTH TWICE A DAY 180 tablet 1  . omeprazole (PRILOSEC) 40 MG capsule TAKE ONE CAPSULE BY MOUTH TWICE A DAY 60 capsule 6  . primidone (MYSOLINE) 50 MG tablet Take by mouth 3 (three) times daily.    . Simethicone (GAS RELIEF) 125 MG CAPS Take 125 mg by mouth daily.    [name] venlafaxine XR (EFFEXOR-XR) 150 MG 24 hr capsule Take 1 capsule (150 mg total) by mouth daily. 90 capsule 0   No current facility-administered  medications on file prior to visit.     Review of Systems:  As per HPI- otherwise negative.   Physical Examination: Vitals:   12/01/17 1005  BP: 110/68  Pulse: 65  Resp: 16  Temp: 98.3 F (36.8 [name])  SpO2: 98%   Vitals:   12/01/17 1005  Weight: 149 lb (67.6 kg)  Height: 5\' 3"  (1.6 m)  Body mass index is 26.39 kg/m. Ideal Body Weight: Weight in (lb) to have BMI = 25: 140.8  GEN: WDWN, NAD, Non-toxic, A & O x 3, looks well  HEENT: Atraumatic, Normocephalic. Neck supple. No masses, No LAD.  Bilateral TM wnl, oropharynx normal.  PEERL,EOMI.   Mild cerumen build up in left external ear canal  Ears and Nose: No external deformity. CV: RRR, No M/G/R. No JVD. No thrill. No extra heart sounds. PULM: CTA B, no wheezes, crackles, rhonchi. No retractions. No resp. distress. No accessory muscle use. ABD: S, NT, ND, +BS. No rebound. No HSM. EXTR: No [name]/[name]/e NEURO Normal gait.  PSYCH: Normally interactive. Conversant. Not depressed or anxious appearing.  Calm demeanor. tiny wound on right forearm, does not need stitches but should have tetanus booster   Assessment and Plan: Type 2 diabetes mellitus without complication, without long-term current use of insulin (HCC) - Plan: Microalbumin / creatinine urine ratio, CBC, Comprehensive metabolic panel, Hemoglobin A1c  Encounter for hepatitis [name] screening test for low risk patient - Plan: Hepatitis [name] antibody  Immunization due - Plan: Pneumococcal conjugate vaccine 13-valent IM, Tdap vaccine greater than or equal to 7yo IM  Open wound of right forearm, initial encounter - Plan: Tdap vaccine greater than or equal to 7yo IM  Tdap, prevanr 13 today Labs pending as above Referral for dexa Hep [name] screening Follow-up on her diet controlled DM   Signed Abbe Amsterdam, MD  Received labs, called pt due to hyponatremia  She will decrease water intake, increase salt, gatorade.  Repeat BMP lab visit only on Thursday.  Otherwise labs look good,  letter to pt  Results for orders placed or performed in visit on 12/01/17  Microalbumin / creatinine urine ratio  Result Value Ref Range   Microalb, Ur <0.7 0.0 - 1.9 mg/dL   Creatinine,U 16.1 mg/dL   Microalb Creat Ratio 1.4 0.0 - 30.0 mg/g  CBC  Result Value Ref Range   WBC 8.1 4.0 - 10.5 K/uL   RBC 3.87 3.87 - 5.11 Mil/uL   Platelets 223.0 150.0 - 400.0 K/uL   Hemoglobin 12.4 12.0 - 15.0 g/dL   HCT 09.6 04.5 - 40.9 %   MCV 93.4 78.0 - 100.0 fl   MCHC 34.1 30.0 - 36.0 g/dL   RDW 81.1 91.4 - 78.2 %  Comprehensive metabolic panel  Result Value Ref Range   Sodium 128 (L) 135 - 145 mEq/L   Potassium 4.7 3.5 - 5.1 mEq/L   Chloride 94 (L) 96 - 112 mEq/L   CO2 25 19 - 32 mEq/L   Glucose, Bld 99 70 - 99 mg/dL   BUN 8 6 - 23 mg/dL   Creatinine, Ser 9.56 0.40 - 1.20 mg/dL   Total Bilirubin 0.5 0.2 - 1.2 mg/dL   Alkaline Phosphatase 124 (H) 39 - 117 U/L   AST 17 0 - 37 U/L   ALT 11 0 - 35 U/L   Total Protein 6.1 6.0 - 8.3 g/dL   Albumin 3.9 3.5 - 5.2 g/dL   Calcium 8.7 8.4 - 21.3 mg/dL   GFR 08.65 >78.46 mL/min  Hemoglobin A1c  Result Value Ref Range   Hgb A1c MFr Bld 5.4 4.6 - 6.5 %

## 2017-12-01 ENCOUNTER — Ambulatory Visit (INDEPENDENT_AMBULATORY_CARE_PROVIDER_SITE_OTHER): Payer: Medicare Other | Admitting: Family Medicine

## 2017-12-01 ENCOUNTER — Encounter: Payer: Self-pay | Admitting: Family Medicine

## 2017-12-01 VITALS — BP 110/68 | HR 65 | Temp 98.3°F | Resp 16 | Ht 63.0 in | Wt 149.0 lb

## 2017-12-01 DIAGNOSIS — I6523 Occlusion and stenosis of bilateral carotid arteries: Secondary | ICD-10-CM

## 2017-12-01 DIAGNOSIS — H43813 Vitreous degeneration, bilateral: Secondary | ICD-10-CM | POA: Diagnosis not present

## 2017-12-01 DIAGNOSIS — Z1159 Encounter for screening for other viral diseases: Secondary | ICD-10-CM | POA: Diagnosis not present

## 2017-12-01 DIAGNOSIS — E871 Hypo-osmolality and hyponatremia: Secondary | ICD-10-CM | POA: Diagnosis not present

## 2017-12-01 DIAGNOSIS — E119 Type 2 diabetes mellitus without complications: Secondary | ICD-10-CM

## 2017-12-01 DIAGNOSIS — H40013 Open angle with borderline findings, low risk, bilateral: Secondary | ICD-10-CM | POA: Diagnosis not present

## 2017-12-01 DIAGNOSIS — E2839 Other primary ovarian failure: Secondary | ICD-10-CM

## 2017-12-01 DIAGNOSIS — H04121 Dry eye syndrome of right lacrimal gland: Secondary | ICD-10-CM | POA: Diagnosis not present

## 2017-12-01 DIAGNOSIS — Z13820 Encounter for screening for osteoporosis: Secondary | ICD-10-CM | POA: Diagnosis not present

## 2017-12-01 DIAGNOSIS — S51801A Unspecified open wound of right forearm, initial encounter: Secondary | ICD-10-CM | POA: Diagnosis not present

## 2017-12-01 DIAGNOSIS — H524 Presbyopia: Secondary | ICD-10-CM | POA: Diagnosis not present

## 2017-12-01 DIAGNOSIS — Z23 Encounter for immunization: Secondary | ICD-10-CM

## 2017-12-01 DIAGNOSIS — Z961 Presence of intraocular lens: Secondary | ICD-10-CM | POA: Diagnosis not present

## 2017-12-01 LAB — COMPREHENSIVE METABOLIC PANEL
ALK PHOS: 124 U/L — AB (ref 39–117)
ALT: 11 U/L (ref 0–35)
AST: 17 U/L (ref 0–37)
Albumin: 3.9 g/dL (ref 3.5–5.2)
BILIRUBIN TOTAL: 0.5 mg/dL (ref 0.2–1.2)
BUN: 8 mg/dL (ref 6–23)
CO2: 25 mEq/L (ref 19–32)
CREATININE: 0.66 mg/dL (ref 0.40–1.20)
Calcium: 8.7 mg/dL (ref 8.4–10.5)
Chloride: 94 mEq/L — ABNORMAL LOW (ref 96–112)
GFR: 93.48 mL/min (ref >60.00)
Glucose, Bld: 99 mg/dL (ref 70–99)
Potassium: 4.7 mEq/L (ref 3.5–5.1)
SODIUM: 128 meq/L — AB (ref 135–145)
TOTAL PROTEIN: 6.1 g/dL (ref 6.0–8.3)

## 2017-12-01 LAB — CBC
HEMATOCRIT: 36.2 % (ref 36.0–46.0)
Hemoglobin: 12.4 g/dL (ref 12.0–15.0)
MCHC: 34.1 g/dL (ref 30.0–36.0)
MCV: 93.4 fl (ref 78.0–100.0)
Platelets: 223 10*3/uL (ref 150.0–400.0)
RBC: 3.87 Mil/uL (ref 3.87–5.11)
RDW: 13.4 % (ref 11.5–15.5)
WBC: 8.1 10*3/uL (ref 4.0–10.5)

## 2017-12-01 LAB — MICROALBUMIN / CREATININE URINE RATIO
CREATININE, U: 48.6 mg/dL
Microalb Creat Ratio: 1.4 mg/g (ref 0.0–30.0)

## 2017-12-01 LAB — HEMOGLOBIN A1C: HEMOGLOBIN A1C: 5.4 % (ref 4.6–6.5)

## 2017-12-01 NOTE — Patient Instructions (Signed)
Great to see you today as always!  I will be in touch with your labs You got a tetanus/ whooping cough booster and your pneumonia booster today I will order a bone density scan for you as well We did your hepatitis C screening today- this is recommended for all adults You might try some OTC earwax softening drops such as Debrox to help prevent build up of hard ear wax Assuming your labs are ok let's plan to visit in about 6 months

## 2017-12-02 LAB — HEPATITIS C ANTIBODY
HEP C AB: NONREACTIVE
SIGNAL TO CUT-OFF: 0.01 (ref <1.00)

## 2017-12-04 ENCOUNTER — Other Ambulatory Visit (INDEPENDENT_AMBULATORY_CARE_PROVIDER_SITE_OTHER): Payer: Medicare Other

## 2017-12-04 ENCOUNTER — Ambulatory Visit (HOSPITAL_BASED_OUTPATIENT_CLINIC_OR_DEPARTMENT_OTHER)
Admission: RE | Admit: 2017-12-04 | Discharge: 2017-12-04 | Disposition: A | Discharge status: Home / Self Care | Payer: Medicare Other | Source: Ambulatory Visit | Attending: Family Medicine | Admitting: Family Medicine

## 2017-12-04 DIAGNOSIS — E2839 Other primary ovarian failure: Secondary | ICD-10-CM | POA: Diagnosis not present

## 2017-12-04 DIAGNOSIS — E871 Hypo-osmolality and hyponatremia: Secondary | ICD-10-CM | POA: Diagnosis not present

## 2017-12-04 DIAGNOSIS — M810 Age-related osteoporosis without current pathological fracture: Secondary | ICD-10-CM | POA: Insufficient documentation

## 2017-12-04 DIAGNOSIS — Z780 Asymptomatic menopausal state: Secondary | ICD-10-CM | POA: Diagnosis not present

## 2017-12-04 DIAGNOSIS — Z13820 Encounter for screening for osteoporosis: Secondary | ICD-10-CM | POA: Insufficient documentation

## 2017-12-04 DIAGNOSIS — M85851 Other specified disorders of bone density and structure, right thigh: Secondary | ICD-10-CM | POA: Diagnosis not present

## 2017-12-04 LAB — BASIC METABOLIC PANEL
BUN: 9 mg/dL (ref 6–23)
CHLORIDE: 95 meq/L — AB (ref 96–112)
CO2: 30 mEq/L (ref 19–32)
CREATININE: 0.71 mg/dL (ref 0.40–1.20)
Calcium: 8.9 mg/dL (ref 8.4–10.5)
GFR: 85.93 mL/min (ref >60.00)
Glucose, Bld: 86 mg/dL (ref 70–99)
Potassium: 4.8 mEq/L (ref 3.5–5.1)
Sodium: 132 mEq/L — ABNORMAL LOW (ref 135–145)

## 2017-12-09 ENCOUNTER — Telehealth: Payer: Self-pay | Admitting: Family Medicine

## 2017-12-09 ENCOUNTER — Encounter: Payer: Self-pay | Admitting: Family Medicine

## 2017-12-09 DIAGNOSIS — M810 Age-related osteoporosis without current pathological fracture: Secondary | ICD-10-CM | POA: Insufficient documentation

## 2017-12-09 HISTORY — DX: Age-related osteoporosis without current pathological fracture: M81.0

## 2017-12-09 MED ORDER — ALENDRONATE SODIUM 70 MG PO TABS: 70.0000 mg | ORAL_TABLET | ORAL | 11 refills | Status: DC

## 2017-12-09 NOTE — Telephone Encounter (Signed)
Called pt to discuss her bone density report which does show osteoporosis.  She would like to start a bisphosphonate for therapy.   Plan to rx fosamax for her and repeat dexa in 2 years  She is in agreement with plan.  However I do see where Dr. Leone PayorGessner did an endoscopy for her in May- will make sure he approves of fosamax prior to her taking it.  Pt states understanding   ASSESSMENT: The BMD measured at Forearm Radius 33% is 0.537 g/cm2 with a T-score of -3.9. This patient is considered osteoporotic according to World Health Organization [facility](WHO) criteria. Lumbar spine was not utilized due to surgical clips covering entire length of the patient's lumbar spine.

## 2017-12-11 NOTE — Telephone Encounter (Signed)
Patient checking status of fosamax. Please advise. Call back [phone]

## 2017-12-16 ENCOUNTER — Telehealth: Payer: Self-pay

## 2017-12-16 NOTE — Telephone Encounter (Signed)
Copied from CRM [id number]. Topic: General - Other >> Dec 15, 2017 11:36 AM Mcneil, Ja-Kwan wrote: Reason for CRM: Pt states she was prescribed a medication for calcium but she has not been taking it. Pt states she was suppose to receive a call to advise when she should begin taking the medication. Pt requests a return call. Cb# [phone]

## 2017-12-16 NOTE — Addendum Note (Signed)
Addended by: Abbe AmsterdamOPLAND, Tosha Belgarde C on: 12/16/2017 01:01 PM   Modules accepted: Orders

## 2017-12-16 NOTE — Telephone Encounter (Signed)
Called pt - between her history of gastric bypass and her esophageal issues let's use prolia instead. She is ok with this. Will ask [place] to start working on approval

## 2018-01-09 ENCOUNTER — Other Ambulatory Visit (HOSPITAL_COMMUNITY): Payer: Self-pay | Admitting: Psychiatry

## 2018-01-09 DIAGNOSIS — F331 Major depressive disorder, recurrent, moderate: Secondary | ICD-10-CM

## 2018-01-14 ENCOUNTER — Other Ambulatory Visit (HOSPITAL_COMMUNITY): Payer: Self-pay

## 2018-01-14 DIAGNOSIS — F331 Major depressive disorder, recurrent, moderate: Secondary | ICD-10-CM

## 2018-01-14 MED ORDER — VENLAFAXINE HCL ER 150 MG PO CP24: 150.0000 mg | ORAL_CAPSULE | Freq: Every day | ORAL | 0 refills | Status: DC

## 2018-01-14 MED ORDER — LAMOTRIGINE 25 MG PO TABS: 75.0000 mg | ORAL_TABLET | Freq: Every day | ORAL | 0 refills | Status: DC

## 2018-01-19 ENCOUNTER — Ambulatory Visit: Payer: Self-pay | Admitting: Internal Medicine

## 2018-01-20 ENCOUNTER — Ambulatory Visit (HOSPITAL_COMMUNITY): Payer: Self-pay | Admitting: Psychiatry

## 2018-01-22 ENCOUNTER — Encounter: Payer: Self-pay | Admitting: Internal Medicine

## 2018-01-29 ENCOUNTER — Ambulatory Visit (INDEPENDENT_AMBULATORY_CARE_PROVIDER_SITE_OTHER): Payer: Medicare Other | Admitting: Family Medicine

## 2018-01-29 ENCOUNTER — Encounter: Payer: Self-pay | Admitting: Family Medicine

## 2018-01-29 VITALS — BP 120/72 | HR 64 | Temp 98.7°F | Ht 63.0 in | Wt 150.5 lb

## 2018-01-29 DIAGNOSIS — L280 Lichen simplex chronicus: Secondary | ICD-10-CM | POA: Diagnosis not present

## 2018-01-29 DIAGNOSIS — Z8669 Personal history of other diseases of the nervous system and sense organs: Secondary | ICD-10-CM

## 2018-01-29 DIAGNOSIS — Z01419 Encounter for gynecological examination (general) (routine) without abnormal findings: Secondary | ICD-10-CM | POA: Diagnosis not present

## 2018-01-29 DIAGNOSIS — G250 Essential tremor: Secondary | ICD-10-CM | POA: Diagnosis not present

## 2018-01-29 DIAGNOSIS — N763 Subacute and chronic vulvitis: Secondary | ICD-10-CM | POA: Diagnosis not present

## 2018-01-29 MED ORDER — PRIMIDONE 50 MG PO TABS: 50.0000 mg | ORAL_TABLET | Freq: Three times a day (TID) | ORAL | 1 refills | Status: DC

## 2018-01-29 NOTE — Progress Notes (Signed)
Chief Complaint  Patient presents with  . Follow-up    Subjective: Patient is a 72 y.o. female here for med check.  Takes primidone 50 mg tid for ess tremor. Controls symptoms well. Tolerating medicine well. Compliant for most part. S/s's are poorly controlled when she will intermittently forget afternoon dose.   Hx of cerumen impaction. Has been using drops to keep ears clean, not Q tips. Hearing is fine. Would like ears checked today.   ROS: Neuro: +tremor  Past Medical History:  Diagnosis Date  . Anemia   . Arthritis   . Asthma   . Back pain   . Barrett's esophagus   . Bladder infection   . Depression   . Diabetes mellitus, type II (HCC)   . Gallstones   . Gastritis   . GERD (gastroesophageal reflux disease)   . Hyperlipidemia   . Memory deficits   . Osteoporosis 12/09/2017  . PTSD (post-traumatic stress disorder)   . Thyroid disease     Objective: BP 120/72 (BP Location: Left Arm, Patient Position: Sitting, Cuff Size: Normal)   Pulse 64   Temp 98.7 F (37.1 C) (Oral)   Ht 5\' 3"  (1.6 m)   Wt 150 lb 8 oz (68.3 kg)   SpO2 98%   BMI 26.66 kg/m  General: Awake, appears stated age HEENT: L canal with minimal cerumen, TM's neg b//l, R canal is patent Heart: RRR Lungs: CTAB, no rales, wheezes or rhonchi. No accessory muscle use Neuro: +intention tremor, fine resting tremor slightly more pronounced than a physiologic tremor b/l hands Psych: Age appropriate judgment and insight, normal affect and mood  Assessment and Plan: Tremor, hereditary, benign - Plan: primidone (MYSOLINE) 50 MG tablet  History of impacted cerumen  Orders as above. 90 d supply given per request. Cont current dose. Discussed ear canal hygiene. F/u as originally scheduled with Dr. Patsy Lageropland or prn.  The patient voiced understanding and agreement to the plan.  Jilda Rocheicholas Paul [place], DO 01/29/18  3:03 PM

## 2018-01-29 NOTE — Progress Notes (Signed)
Pre visit review using our clinic review tool, if applicable. No additional management support is needed unless otherwise documented below in the visit note. 

## 2018-01-29 NOTE — Patient Instructions (Signed)
OK to use Debrox (peroxide) in the ear to loosen up wax. Also recommend using a bulb syringe (for removing boogers from baby's noses) to flush through warm water. Do not use Q-tips as this can impact wax further.  Let us know if you need anything. 

## 2018-02-13 ENCOUNTER — Ambulatory Visit: Payer: Self-pay | Admitting: Internal Medicine

## 2018-02-15 ENCOUNTER — Other Ambulatory Visit: Payer: Self-pay | Admitting: Family Medicine

## 2018-03-08 ENCOUNTER — Other Ambulatory Visit: Payer: Self-pay | Admitting: Family Medicine

## 2018-03-12 ENCOUNTER — Ambulatory Visit (AMBULATORY_SURGERY_CENTER): Payer: Self-pay

## 2018-03-12 VITALS — Ht 63.0 in | Wt 147.8 lb

## 2018-03-12 DIAGNOSIS — Z1211 Encounter for screening for malignant neoplasm of colon: Secondary | ICD-10-CM

## 2018-03-12 NOTE — Progress Notes (Signed)
Denies allergies to eggs or soy products. Denies complication of anesthesia or sedation. Denies use of weight loss medication. Denies use of O2.   Emmi instructions declined.  

## 2018-03-15 ENCOUNTER — Other Ambulatory Visit: Payer: Self-pay | Admitting: Medical

## 2018-03-25 ENCOUNTER — Ambulatory Visit (AMBULATORY_SURGERY_CENTER): Payer: Medicare Other | Admitting: Internal Medicine

## 2018-03-25 ENCOUNTER — Encounter: Payer: Self-pay | Admitting: Internal Medicine

## 2018-03-25 VITALS — BP 107/69 | HR 55 | Temp 96.6°F | Resp 12 | Ht 63.0 in | Wt 147.0 lb

## 2018-03-25 DIAGNOSIS — Z1211 Encounter for screening for malignant neoplasm of colon: Secondary | ICD-10-CM

## 2018-03-25 MED ORDER — SODIUM CHLORIDE 0.9 % IV SOLN: 500.0000 mL | Freq: Once | INTRAVENOUS | Status: DC

## 2018-03-25 NOTE — Progress Notes (Signed)
Pt's states no medical or surgical changes since previsit or office visit. 

## 2018-03-25 NOTE — Patient Instructions (Addendum)
   The colonoscopy was normal. No polyps or cancer seen.  You do not need another routine repeat colonoscopy.  I appreciate the opportunity to care for you. Iva Boop, MD, FACG   YOU HAD AN ENDOSCOPIC PROCEDURE TODAY AT THE [facility]:   Refer to the procedure report that was given to you for any specific questions about what was found during the examination.  If the procedure report does not answer your questions, please call your gastroenterologist to clarify.  If you requested that your care partner not be given the details of your procedure findings, then the procedure report has been included in a sealed envelope for you to review at your convenience later.  YOU SHOULD EXPECT: Some feelings of bloating in the abdomen. Passage of more gas than usual.  Walking can help get rid of the air that was put into your GI tract during the procedure and reduce the bloating. If you had a lower endoscopy (such as a colonoscopy or flexible sigmoidoscopy) you may notice spotting of blood in your stool or on the toilet paper. If you underwent a bowel prep for your procedure, you may not have a normal bowel movement for a few days.  Please Note:  You might notice some irritation and congestion in your nose or some drainage.  This is from the oxygen used during your procedure.  There is no need for concern and it should clear up in a day or so.  SYMPTOMS TO REPORT IMMEDIATELY:   Following lower endoscopy (colonoscopy or flexible sigmoidoscopy):  Excessive amounts of blood in the stool  Significant tenderness or worsening of abdominal pains  Swelling of the abdomen that is new, acute  Fever of 100F or higher    For urgent or emergent issues, a gastroenterologist can be reached at any hour by calling [phone].   DIET:  We do recommend a small meal at first, but then you may proceed to your regular diet.  Drink plenty of fluids but you should avoid alcoholic beverages for  24 hours.  ACTIVITY:  You should plan to take it easy for the rest of today and you should NOT DRIVE or use heavy machinery until tomorrow (because of the sedation medicines used during the test).    FOLLOW UP: Our staff will call the number listed on your records the next business day following your procedure to check on you and address any questions or concerns that you may have regarding the information given to you following your procedure. If we do not reach you, we will leave a message.  However, if you are feeling well and you are not experiencing any problems, there is no need to return our call.  We will assume that you have returned to your regular daily activities without incident.  If any biopsies were taken you will be contacted by phone or by letter within the next 1-3 weeks.  Please call us at [phone] if you have not heard about the biopsies in 3 weeks.    SIGNATURES/CONFIDENTIALITY: You and/or your care partner have signed paperwork which will be entered into your electronic medical record.  These signatures attest to the fact that that the information above on your After Visit Summary has been reviewed and is understood.  Full responsibility of the confidentiality of this discharge information lies with you and/or your care-partner.

## 2018-03-25 NOTE — Op Note (Signed)
[facility] Patient Name: Alexis [name] Procedure Date: 03/25/2018 10:15 AM MRN: [id number] Endoscopist: Iva Boop , MD Age: 72 Referring MD:  Date of Birth: 1946-01-25 Gender: Female Account #: [id number] Procedure:                Colonoscopy Indications:              Screening for colorectal malignant neoplasm Medicines:                Propofol per Anesthesia, Monitored Anesthesia Care Procedure:                Pre-Anesthesia Assessment:                           - Prior to the procedure, a History and Physical                            was performed, and patient medications and                            allergies were reviewed. The patient's tolerance of                            previous anesthesia was also reviewed. The risks                            and benefits of the procedure and the sedation                            options and risks were discussed with the patient.                            All questions were answered, and informed consent                            was obtained. Prior Anticoagulants: The patient has                            taken no previous anticoagulant or antiplatelet                            agents. ASA Grade Assessment: III - A patient with                            severe systemic disease. After reviewing the risks                            and benefits, the patient was deemed in                            satisfactory condition to undergo the procedure.                           After obtaining informed consent, the colonoscope  was passed under direct vision. Throughout the                            procedure, the patient's blood pressure, pulse, and                            oxygen saturations were monitored continuously. The                            Colonoscope was introduced through the anus and                            advanced to the the cecum, identified by     appendiceal orifice and ileocecal valve. The                            quality of the bowel preparation was adequate. The                            colonoscopy was performed without difficulty. The                            patient tolerated the procedure well. The bowel                            preparation used was Miralax. Scope In: 10:21:22 AM Scope Out: 10:52:45 AM Scope Withdrawal Time: 0 hours 20 minutes 54 seconds  Total Procedure Duration: 0 hours 31 minutes 23 seconds  Findings:                 The perianal and digital rectal examinations were                            normal.                           The colon (entire examined portion) appeared normal.                           No additional abnormalities were found on                            retroflexion. Complications:            No immediate complications. Estimated blood loss:                            None. Estimated Blood Loss:     Estimated blood loss: none. Impression:               - The entire examined colon is normal.                           - No specimens collected. Recommendation:           - Patient has a contact number available for  emergencies. The signs and symptoms of potential                            delayed complications were discussed with the                            patient. Return to normal activities tomorrow.                            Written discharge instructions were provided to the                            patient.                           - Resume previous diet.                           - Continue present medications.                           - No repeat colonoscopy due to current age (80                            years or older) and the absence of colonic polyps. Iva Boop, MD 03/25/2018 10:57:09 AM This report has been signed electronically.

## 2018-03-25 NOTE — Progress Notes (Signed)
A and O x3. Report to RN. Tolerated MAC anesthesia well.

## 2018-03-26 ENCOUNTER — Telehealth: Payer: Self-pay

## 2018-03-26 NOTE — Telephone Encounter (Signed)
  Follow up Call-  Call back number 03/25/2018 11/04/2017  Post procedure Call Back phone  # [phone]  Permission to leave phone message Yes Yes  Some recent data might be hidden     Patient questions:  Do you have a fever, pain , or abdominal swelling? No. Pain Score  0 *  Have you tolerated food without any problems? Yes.    Have you been able to return to your normal activities? Yes.    Do you have any questions about your discharge instructions: Diet   No. Medications  No. Follow up visit  No.  Do you have questions or concerns about your Care? No.  Actions: * If pain score is 4 or above: No action needed, pain <4.

## 2018-04-06 ENCOUNTER — Encounter (HOSPITAL_COMMUNITY): Payer: Self-pay | Admitting: Psychiatry

## 2018-04-06 ENCOUNTER — Ambulatory Visit (INDEPENDENT_AMBULATORY_CARE_PROVIDER_SITE_OTHER): Payer: Medicare Other | Admitting: Psychiatry

## 2018-04-06 ENCOUNTER — Ambulatory Visit (INDEPENDENT_AMBULATORY_CARE_PROVIDER_SITE_OTHER): Payer: Medicare Other

## 2018-04-06 VITALS — BP 126/76 | HR 60 | Ht 63.0 in | Wt 149.0 lb

## 2018-04-06 DIAGNOSIS — F331 Major depressive disorder, recurrent, moderate: Secondary | ICD-10-CM

## 2018-04-06 DIAGNOSIS — F4310 Post-traumatic stress disorder, unspecified: Secondary | ICD-10-CM | POA: Diagnosis not present

## 2018-04-06 DIAGNOSIS — I6523 Occlusion and stenosis of bilateral carotid arteries: Secondary | ICD-10-CM | POA: Diagnosis not present

## 2018-04-06 DIAGNOSIS — Z23 Encounter for immunization: Secondary | ICD-10-CM | POA: Diagnosis not present

## 2018-04-06 MED ORDER — LAMOTRIGINE 25 MG PO TABS: 75.0000 mg | ORAL_TABLET | Freq: Every day | ORAL | 0 refills | Status: DC

## 2018-04-06 MED ORDER — VENLAFAXINE HCL ER 150 MG PO CP24: 150.0000 mg | ORAL_CAPSULE | Freq: Every day | ORAL | 0 refills | Status: DC
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
## 2018-04-09 ENCOUNTER — Other Ambulatory Visit: Payer: Self-pay | Admitting: Medical

## 2018-04-20 ENCOUNTER — Other Ambulatory Visit: Payer: Self-pay | Admitting: Family Medicine

## 2018-05-30 NOTE — Progress Notes (Addendum)
[facility] [address], [address] [place], [place] [id number] [phone] [phone]  Date:  06/01/2018   Name:  Alexis BoringSandra Kolden   DOB:  07-Oct-1945   MRN:  [id number]  PCP:  Pearline Cablesopland, Areil Ottey C, MD    Chief Complaint: Diabetes (6 month follow up, med check)   History of Present Illness:  Alexis [name] is a 72 y.o. very pleasant female patient who presents with the following:  Periodic follow-up visit today History of diet controlled diabetes, osteoporosis, gastric bypass, tremor.  She also has history of lichen simplex chronicus of the vulva, which is managed by her GYN doctor. Last seen by myself in June time we update her immunizations and labs.  Bone density exam done in June showed osteoporosis.  Discussed with GI, and we decided against oral bisphosphonate due to some swallowing issues.  The plan was to start Prolia for her. ?did this ever happen? She never heard about this, I will check on it for her   She also sees a psychiatrist, Dr. Lolly MustacheArfeen.  Last saw him in October.  Eye exam: she will go next month, asked her to have the doctor send me a report  She has been eating more and wonders how her A1c will look She has gained some weight and will work on this again.  She tends to eat during the night - she may wake up hungry.  We discussed trying a cut of hot tea when this happens instead of eating   Wt Readings from Last 3 Encounters:  06/01/18 154 lb (69.9 kg)  03/25/18 147 lb (66.7 kg)  03/12/18 147 lb 12.8 oz (67 kg)   She also notes sinus congestion "for the last 4 months" She is using Flonase and a Nettie pot. Offered a trial of antibiotics, but she declines today.  Lab Results  Component Value Date   HGBA1C 5.4 12/01/2017   We will go over her refills today   Patient Active Problem List   Diagnosis Date Noted  . Osteoporosis 12/09/2017  . Syncope 07/17/2017  . Screening for hyperlipidemia 07/17/2017  . Shortness of  breath 07/17/2017  . Lichen simplex chronicus 03/05/2017  . Type 2 diabetes mellitus without complication, without long-term current use of insulin (HCC) 03/05/2017  . Perianal lesion 01/31/2017  . Bilateral carotid artery stenosis 12/12/2016  . Restless legs 12/11/2016  . History of gastric bypass 12/11/2016  . PTSD (post-traumatic stress disorder) 12/11/2016  . 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 Risk for falls 12/10/2016  . Other dysphagia 08/19/2016  . Right foot drop 08/19/2016  . Women's annual routine gynecological examination 01/16/2016  . Chronic vulvitis 12/25/2015  . Microcalcification of left breast on mammogram 05/11/2015  . Tension type headache 06/14/2013  . Hypersomnia 04/13/2013    Past Medical History:  Diagnosis Date  . Allergy   . Anemia   . Arthritis   . Asthma   . Back pain   . Barrett's esophagus   . Bladder infection   . Cataract   . Depression   . Diabetes mellitus, type II (HCC)   . Gallstones   . Gastritis   . GERD (gastroesophageal reflux disease)   . Hyperlipidemia   . Memory deficits   . Osteoporosis 12/09/2017  . PTSD (post-traumatic stress disorder)   . Sleep apnea   . Thyroid disease    Patient denies    Past Surgical History:  Procedure Laterality Date  . CHOLECYSTECTOMY  1999  . COLONOSCOPY    . ESOPHAGOGASTRODUODENOSCOPY    . GANGLION CYST EXCISION Left   . GASTRIC BYPASS  2000  . TONSILLECTOMY AND ADENOIDECTOMY     age 29    Social History   Tobacco Use  . Smoking status: Never Smoker  . Smokeless tobacco: Never Used  Substance Use Topics  . Alcohol use: No    Alcohol/week: 0.0 standard drinks  . Drug use: No    Family History  Problem Relation Age of Onset  . Depression Mother   . Heart disease Mother   . Kidney disease Mother   . Alcohol abuse Father   . Tremor Father   . Prostate cancer Father   . Other Father        Hypoglycemia  . Heart disease Father   . Diabetes Paternal Grandmother   . Other  Daughter        stillborn  . Colon cancer Neg Hx   . Esophageal cancer Neg Hx   . Rectal cancer Neg Hx   . Stomach cancer Neg Hx     Allergies  Allergen Reactions  . Metformin   . Penicillins     Other reaction(s): HIVES    Medication list has been reviewed and updated.  Current Outpatient Medications on File Prior to Visit  Medication Sig Dispense Refill  . aspirin (ASPIRIN EC) 81 MG EC tablet Take 81 mg by mouth at bedtime. Swallow whole.    . b complex vitamins tablet Take 1 tablet by mouth 2 (two) times daily.    [name] BIOTIN 5000 PO Take 1 tablet by mouth 2 (two) times daily.    . Cyanocobalamin (VITAMIN B12 TR PO) Take 500 mcg by mouth at bedtime.     . diphenhydrAMINE (BENADRYL) 25 mg capsule Take 25 mg by mouth 3 (three) times daily.    . fluticasone (FLONASE) 50 MCG/ACT nasal spray SPRAY 2 SPRAYS INTO EACH NOSTRIL EVERY DAY 16 g 3  . gabapentin (NEURONTIN) 300 MG capsule TAKE 1 CAPSULE BY MOUTH EVERY DAY AT NIGHT 90 capsule 7  . glucose blood (ACCU-CHEK ACTIVE STRIPS) test strip Use as instructed- up to once a day.  Dx E11.9 100 each 12  . lamoTRIgine (LAMICTAL) 25 MG tablet Take 3 tablets (75 mg total) by mouth daily. 270 tablet 0  . loperamide (IMODIUM A-D) 2 MG tablet Take 2 mg by mouth as needed.     . metoCLOPramide (REGLAN) 10 MG tablet TAKE 1 TABLET BY MOUTH TWICE A DAY 180 tablet 1  . omeprazole (PRILOSEC) 40 MG capsule TAKE 1 CAPSULE BY MOUTH TWICE A DAY 180 capsule 2  . OVER THE COUNTER MEDICATION Pro biotic, One capsule daily.    . polyethylene glycol powder (GLYCOLAX/MIRALAX) powder Take 1 Container by mouth once.    . primidone (MYSOLINE) 50 MG tablet Take 1 tablet (50 mg total) by mouth 3 (three) times daily. 270 tablet 1  . triamcinolone ointment (KENALOG) 0.1 % APPLY TO AFFECTED AREA 3-4 TIMES PER WEEK AS NEEDED  3  . venlafaxine XR (EFFEXOR-XR) 150 MG 24 hr capsule Take 1 capsule (150 mg total) by mouth daily. 90 capsule 0   No current facility-administered  medications on file prior to visit.     Review of Systems:  As per HPI- otherwise negative.  No fever or chills No CP or SOB  Physical Examination: Vitals:   06/01/18 0959  BP: 136/70  Pulse: 65  Resp: 16  SpO2: 97%  Vitals:   06/01/18 0959  Weight: 154 lb (69.9 kg)  Height: 5\' 3"  (1.6 m)   Body mass index is 27.28 kg/m. Ideal Body Weight: Weight in (lb) to have BMI = 25: 140.8  GEN: WDWN, NAD, Non-toxic, A & O x 3, looks well, mild overweight  HEENT: Atraumatic, Normocephalic. Neck supple. No masses, No LAD.  Bilateral TM wnl, oropharynx normal.  PEERL,EOMI.   She has a small nose with smaller nares.  There is congestion and inflammation of the turbinates visible inside the nares Ears and Nose: No external deformity. CV: RRR, No M/G/R. No JVD. No thrill. No extra heart sounds. PULM: CTA B, no wheezes, crackles, rhonchi. No retractions. No resp. distress. No accessory muscle use. EXTR: No c/c/e NEURO Normal gait.  PSYCH: Normally interactive. Conversant. Not depressed or anxious appearing.  Calm demeanor.   BP Readings from Last 3 Encounters:  06/01/18 136/70  03/25/18 107/69  01/29/18 120/72    Assessment and Plan: Type 2 diabetes mellitus without complication, without long-term current use of insulin (HCC) - Plan: Basic metabolic panel, Hemoglobin A1c  Hyponatremia  Age-related osteoporosis without current pathological fracture  Sinus congestion  Periodic visit today.  We will check her A1c and basic metabolic panel.  She has diabetes which tends to be well controlled, but she has gained a little weight. Discussed starting her on Prolia with our staff member who does the use of prior authorizations.  We will work on getting this approved for her. She has no sinus congestion for several months, but at this point feels that she is doing okay with her current home treatment plan.  She will let me know if she needs anything further in this regard  Signed Abbe Amsterdam, MD  Received her labs- her hyponatremia is worse again Called her to discuss.  She is feeling fine, we have been dealing with hyponatremia for some time.  She has so far not seen nephrology, but is willing to go.  I will set up a referral to nephrology In the meantime she will reduce water, drink Gatorade instead, and increase her salt intake.  I ordered a BMP for her, and she will come in to have this drawn in 1 week. A1c looks fine   Results for orders placed or performed in visit on 06/01/18  Basic metabolic panel  Result Value Ref Range   Sodium 128 (L) 135 - 145 mEq/L   Potassium 4.6 3.5 - 5.1 mEq/L   Chloride 93 (L) 96 - 112 mEq/L   CO2 28 19 - 32 mEq/L   Glucose, Bld 112 (H) 70 - 99 mg/dL   BUN 10 6 - 23 mg/dL   Creatinine, Ser 4.09 0.40 - 1.20 mg/dL   Calcium 8.7 8.4 - 81.1 mg/dL   GFR 91.47 >82.95 mL/min  Hemoglobin A1c  Result Value Ref Range   Hgb A1c MFr Bld 5.4 4.6 - 6.5 %

## 2018-06-01 ENCOUNTER — Ambulatory Visit (INDEPENDENT_AMBULATORY_CARE_PROVIDER_SITE_OTHER): Payer: Medicare Other | Admitting: Family Medicine

## 2018-06-01 ENCOUNTER — Encounter: Payer: Self-pay | Admitting: Family Medicine

## 2018-06-01 VITALS — BP 136/70 | HR 65 | Resp 16 | Ht 63.0 in | Wt 154.0 lb

## 2018-06-01 DIAGNOSIS — E119 Type 2 diabetes mellitus without complications: Secondary | ICD-10-CM

## 2018-06-01 DIAGNOSIS — E871 Hypo-osmolality and hyponatremia: Secondary | ICD-10-CM

## 2018-06-01 DIAGNOSIS — M810 Age-related osteoporosis without current pathological fracture: Secondary | ICD-10-CM

## 2018-06-01 DIAGNOSIS — R0981 Nasal congestion: Secondary | ICD-10-CM

## 2018-06-01 DIAGNOSIS — I6523 Occlusion and stenosis of bilateral carotid arteries: Secondary | ICD-10-CM

## 2018-06-01 LAB — HEMOGLOBIN A1C: HEMOGLOBIN A1C: 5.4 % (ref 4.6–6.5)

## 2018-06-01 LAB — BASIC METABOLIC PANEL
BUN: 10 mg/dL (ref 6–23)
CO2: 28 mEq/L (ref 19–32)
CREATININE: 0.63 mg/dL (ref 0.40–1.20)
Calcium: 8.7 mg/dL (ref 8.4–10.5)
Chloride: 93 mEq/L — ABNORMAL LOW (ref 96–112)
GFR: 98.5 mL/min (ref >60.00)
Glucose, Bld: 112 mg/dL — ABNORMAL HIGH (ref 70–99)
POTASSIUM: 4.6 meq/L (ref 3.5–5.1)
Sodium: 128 mEq/L — ABNORMAL LOW (ref 135–145)

## 2018-06-01 NOTE — Addendum Note (Signed)
Addended by: Abbe AmsterdamOPLAND, Tomma Ehinger C on: 06/01/2018 08:24 PM   Modules accepted: Orders

## 2018-06-01 NOTE — Patient Instructions (Signed)
It was good to see you today.  Have a wonderful holiday season! I will be in touch with your labs ASAP.  We will check on the Prolia medication for your osteoporosis.  Please let me know if you do not hear nothing about this. Otherwise assuming your labs look good we can recheck in 6 months. Please ask your eye doctor to send me a copy of their report.

## 2018-06-03 ENCOUNTER — Telehealth: Payer: Self-pay

## 2018-06-03 NOTE — Telephone Encounter (Signed)
-----   Message from Pearline CablesJessica C [name], MD sent at 06/01/2018 12:48 PM EST ----- This is the patient I mentioned to you who we want to start on Prolia.  Can you please look into her insurance coverage?  Thank you! JC

## 2018-06-03 NOTE — Telephone Encounter (Signed)
Prolia benefits processed through [employer]. Author phoned pt. to confirm she still has both [employer] coverage, and pt. Confirmed. Awaiting summary of benefits so as to schedule her first prolia injection.

## 2018-06-08 ENCOUNTER — Other Ambulatory Visit (INDEPENDENT_AMBULATORY_CARE_PROVIDER_SITE_OTHER): Payer: Medicare Other

## 2018-06-08 DIAGNOSIS — E871 Hypo-osmolality and hyponatremia: Secondary | ICD-10-CM | POA: Diagnosis not present

## 2018-06-08 LAB — BASIC METABOLIC PANEL
BUN: 8 mg/dL (ref 6–23)
CHLORIDE: 100 meq/L (ref 96–112)
CO2: 22 mEq/L (ref 19–32)
Calcium: 9 mg/dL (ref 8.4–10.5)
Creatinine, Ser: 0.68 mg/dL (ref 0.40–1.20)
GFR: 90.19 mL/min (ref >60.00)
Glucose, Bld: 102 mg/dL — ABNORMAL HIGH (ref 70–99)
POTASSIUM: 4.8 meq/L (ref 3.5–5.1)
Sodium: 134 mEq/L — ABNORMAL LOW (ref 135–145)

## 2018-06-09 NOTE — Telephone Encounter (Signed)
Summary of benefits received. No PA required; pt. May owe approximately $0 OOP. Author phoned pt. To make appointment for NV. No answer; Author left detailed VM asking for return call to schedule. OK for PEC to schedule for NV, preferably on 12/30 or 12/31, as supply should be available by then.

## 2018-06-11 DIAGNOSIS — Z1231 Encounter for screening mammogram for malignant neoplasm of breast: Secondary | ICD-10-CM | POA: Diagnosis not present

## 2018-06-15 ENCOUNTER — Ambulatory Visit (INDEPENDENT_AMBULATORY_CARE_PROVIDER_SITE_OTHER): Payer: Medicare Other

## 2018-06-15 DIAGNOSIS — M810 Age-related osteoporosis without current pathological fracture: Secondary | ICD-10-CM

## 2018-06-15 MED ORDER — DENOSUMAB 60 MG/ML ~~LOC~~ SOSY
60.0000 mg | PREFILLED_SYRINGE | Freq: Once | SUBCUTANEOUS | Status: AC
  Administered 2018-06-15: 60 mg via SUBCUTANEOUS

## 2018-06-25 DIAGNOSIS — H40013 Open angle with borderline findings, low risk, bilateral: Secondary | ICD-10-CM | POA: Diagnosis not present

## 2018-06-27 ENCOUNTER — Other Ambulatory Visit (HOSPITAL_COMMUNITY): Payer: Self-pay | Admitting: Psychiatry

## 2018-06-27 DIAGNOSIS — F331 Major depressive disorder, recurrent, moderate: Secondary | ICD-10-CM

## 2018-07-07 ENCOUNTER — Ambulatory Visit (INDEPENDENT_AMBULATORY_CARE_PROVIDER_SITE_OTHER): Payer: Medicare Other | Admitting: Psychiatry

## 2018-07-07 ENCOUNTER — Encounter (HOSPITAL_COMMUNITY): Payer: Self-pay | Admitting: Psychiatry

## 2018-07-07 DIAGNOSIS — F331 Major depressive disorder, recurrent, moderate: Secondary | ICD-10-CM

## 2018-07-07 DIAGNOSIS — F4310 Post-traumatic stress disorder, unspecified: Secondary | ICD-10-CM | POA: Diagnosis not present

## 2018-07-07 MED ORDER — VENLAFAXINE HCL ER 150 MG PO CP24: 150.0000 mg | ORAL_CAPSULE | Freq: Every day | ORAL | 0 refills | Status: DC

## 2018-07-07 MED ORDER — LAMOTRIGINE 25 MG PO TABS: 75.0000 mg | ORAL_TABLET | Freq: Every day | ORAL | 0 refills | Status: DC
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
## 2018-07-09 DIAGNOSIS — E119 Type 2 diabetes mellitus without complications: Secondary | ICD-10-CM | POA: Diagnosis not present

## 2018-07-09 DIAGNOSIS — I6523 Occlusion and stenosis of bilateral carotid arteries: Secondary | ICD-10-CM | POA: Diagnosis not present

## 2018-07-17 ENCOUNTER — Other Ambulatory Visit: Payer: Self-pay | Admitting: Family Medicine

## 2018-07-17 DIAGNOSIS — G250 Essential tremor: Secondary | ICD-10-CM

## 2018-10-06 ENCOUNTER — Encounter (HOSPITAL_COMMUNITY): Payer: Self-pay | Admitting: Psychiatry

## 2018-10-06 ENCOUNTER — Other Ambulatory Visit: Payer: Self-pay

## 2018-10-06 ENCOUNTER — Ambulatory Visit (HOSPITAL_COMMUNITY): Payer: Medicare Other | Admitting: Psychiatry

## 2018-10-06 ENCOUNTER — Ambulatory Visit (INDEPENDENT_AMBULATORY_CARE_PROVIDER_SITE_OTHER): Payer: Medicare Other | Admitting: Psychiatry

## 2018-10-06 DIAGNOSIS — F331 Major depressive disorder, recurrent, moderate: Secondary | ICD-10-CM | POA: Diagnosis not present

## 2018-10-06 DIAGNOSIS — F4310 Post-traumatic stress disorder, unspecified: Secondary | ICD-10-CM

## 2018-10-06 MED ORDER — LAMOTRIGINE 25 MG PO TABS: 75.0000 mg | ORAL_TABLET | Freq: Every day | ORAL | 0 refills | Status: DC

## 2018-10-06 MED ORDER — VENLAFAXINE HCL ER 150 MG PO CP24: 150.0000 mg | ORAL_CAPSULE | Freq: Every day | ORAL | 0 refills | Status: DC
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
## 2018-10-09 ENCOUNTER — Ambulatory Visit: Payer: Self-pay

## 2018-10-09 NOTE — Telephone Encounter (Signed)
Patient called and asks what else can she take OTC for a headache besides Tylenol. She says she is almost out and uses it for a headache brought on by her vision. She says she cannot take Ibuprofen because of her stomach problems. She says you can't find Tylenol in the stores. I advised her to ask her pharmacist and in the meantime, I will send this note to the office and someone will call with Dr. Cyndie Chime recommendation.  Reason for Disposition . Caller has NON-URGENT medication question about med that PCP prescribed and triager unable to answer question  Protocols used: MEDICATION QUESTION CALL-A-AH

## 2018-10-09 NOTE — Telephone Encounter (Signed)
Called her back- she was having a hard time finding tylenol due to pandemic.  She was able to find some today however. She does not need anything else

## 2018-10-19 DIAGNOSIS — E119 Type 2 diabetes mellitus without complications: Secondary | ICD-10-CM | POA: Diagnosis not present

## 2018-10-19 DIAGNOSIS — F4310 Post-traumatic stress disorder, unspecified: Secondary | ICD-10-CM | POA: Diagnosis not present

## 2018-10-19 DIAGNOSIS — E871 Hypo-osmolality and hyponatremia: Secondary | ICD-10-CM | POA: Diagnosis not present

## 2018-10-19 DIAGNOSIS — F339 Major depressive disorder, recurrent, unspecified: Secondary | ICD-10-CM | POA: Diagnosis not present

## 2018-10-23 ENCOUNTER — Other Ambulatory Visit: Payer: Self-pay | Admitting: Family Medicine

## 2018-10-27 ENCOUNTER — Other Ambulatory Visit: Payer: Self-pay | Admitting: Family Medicine

## 2018-11-06 ENCOUNTER — Other Ambulatory Visit: Payer: Self-pay | Admitting: Family Medicine

## 2018-11-06 MED ORDER — OMEPRAZOLE 40 MG PO CPDR: 40.0000 mg | DELAYED_RELEASE_CAPSULE | Freq: Two times a day (BID) | ORAL | 3 refills | Status: DC

## 2018-11-06 NOTE — Telephone Encounter (Signed)
Rx sent 

## 2018-11-06 NOTE — Telephone Encounter (Signed)
Copied from CRM [id number]. Topic: Quick Communication - Rx Refill/Question >> Nov 06, 2018  9:33 AM Alexis [name] wrote: Medication: omeprazole (PRILOSEC) 40 MG capsule  Has the patient contacted their pharmacy? Yes, no more refills   Preferred Pharmacy (with phone number or street name): [id number] - [place], [place] - [address] AT [address] [phone] ([phone] (Fax)    Agent: Please be advised that RX refills may take up to 3 business days. We ask that you follow-up with your pharmacy.

## 2018-11-24 NOTE — Progress Notes (Signed)
[facility] [address], [address] [place], [place] [address] [phone] [phone]  Date:  11/26/2018   Name:  Alexis BoringSandra [name]   DOB:  May 17, 1946   MRN:  [id number]  PCP:  Alexis [name], Alexis Jambor C, MD    Chief Complaint: Diabetes (6 month follow up) and Rash (facial rash, started sunday, itching, using hydrocortisone cream and has helped greatly)   History of Present Illness:  Alexis [name] is a 73 y.o. very pleasant female patient who presents with the following:  Here today for periodic follow-up visit Alexis [name] has history of diet-controlled diabetes, benign tremor, gastric bypass in the year 2000, foot drop, carotid stenosis, depression, osteoporosis She also has known history of mild hyponatremia, she saw nephrology last month Per nephrology note, this is thought likely related to her her SSRI medication.  Encouraged her to continue water restriction and monitor her sodium levels quarterly  [facility], most recent visit was in January.  She has no history of stroke or TIA.  She is taking aspirin.  They plan to repeat her carotid ultrasound in 1 year at that time, continue aspirin  She follows with Dr. Lolly MustacheArfeen for her mental health  Eye exam: appt is in the next couple of months  Due for urine microalbumin Due for A1c Due for foot exam She is coming due for Pneumovax later this month, also can suggest Shingrix Colonoscopy in 2019 Bone density scan last summer-showed osteoporosis.  However she has history of swallowing problems, so we planned to use Prolia as opposed to an oral medication.  She received a dose in December 2019, but I do not see any further doses Per my CMA she is due in July. Pt aware   Lab Results  Component Value Date   HGBA1C 5.6 11/26/2018   Aspirin 81 B12 Gabapentin 300 at bedtime Lamictal Reglan Prilosec Effexor XR She is taking B12 complex  supplement   Her chronic jaw pain- present for a year or so- can be more problematic at time. It hurts to chew on the left side.  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  I did encourage her to give it a try, and she will think about it She uses tylenol prn and this does help  Over the past weekend she also got a rash on her face.   She has tried some Otc cortisone cream and this helped a lot -it is nearly resolved now  She also notes that both of her knees are painful, this is been going on for quite some time is gradually gotten worse.  It is impeding her ability to exercise somewhat.  She would be willing to see an orthopedist about this issue  Patient Active Problem List   Diagnosis Date Noted  . Osteoporosis 12/09/2017  . Syncope 07/17/2017  . Shortness of breath 07/17/2017  . Hyponatremia 03/05/2017  . Lichen simplex chronicus 03/05/2017  . Type 2 diabetes mellitus without complication, without long-term current use of insulin (HCC) 03/05/2017  . Perianal lesion 01/31/2017  . Bilateral carotid artery stenosis 12/12/2016  . Restless legs 12/11/2016  . History of gastric bypass 12/11/2016  . PTSD (post-traumatic stress disorder) 12/11/2016  . 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 Risk for falls 12/10/2016  . Other dysphagia 08/19/2016  . Right foot drop 08/19/2016  .  Chronic vulvitis 12/25/2015  . Microcalcification of left breast on mammogram 05/11/2015  . Tension type headache 06/14/2013  . Hypersomnia 04/13/2013    Past Medical History:  Diagnosis Date  . Allergy   . Anemia   . Arthritis   . Asthma   . Back pain   . Barrett's esophagus   . Bladder infection   . Cataract   . Depression   . Diabetes mellitus, type II (HCC)   . Gallstones   . Gastritis   . GERD (gastroesophageal reflux disease)   . Hyperlipidemia   . Memory deficits   . Osteoporosis 12/09/2017  . PTSD (post-traumatic stress  disorder)   . Sleep apnea   . Thyroid disease    Patient denies    Past Surgical History:  Procedure Laterality Date  . CHOLECYSTECTOMY  1999  . COLONOSCOPY    . ESOPHAGOGASTRODUODENOSCOPY    . GANGLION CYST EXCISION Left   . GASTRIC BYPASS  2000  . TONSILLECTOMY AND ADENOIDECTOMY     age 73    Social History   Tobacco Use  . Smoking status: Never Smoker  . Smokeless tobacco: Never Used  Substance Use Topics  . Alcohol use: No    Alcohol/week: 0.0 standard drinks  . Drug use: No    Family History  Problem Relation Age of Onset  . Depression Mother   . Heart disease Mother   . Kidney disease Mother   . Alcohol abuse Father   . Tremor Father   . Prostate cancer Father   . Other Father        Hypoglycemia  . Heart disease Father   . Diabetes Paternal Grandmother   . Other Daughter        stillborn  . Colon cancer Neg Hx   . Esophageal cancer Neg Hx   . Rectal cancer Neg Hx   . Stomach cancer Neg Hx     Allergies  Allergen Reactions  . Metformin   . Penicillins     Other reaction(s): HIVES    Medication list has been reviewed and updated.  Current Outpatient Medications on File Prior to Visit  Medication Sig Dispense Refill  . aspirin (ASPIRIN EC) 81 MG EC tablet Take 81 mg by mouth at bedtime. Swallow whole.    . b complex vitamins tablet Take 1 tablet by mouth 2 (two) times daily.    [name]. BIOTIN 5000 PO Take 1 tablet by mouth 2 (two) times daily.    . Cyanocobalamin (VITAMIN B12 TR PO) Take 500 mcg by mouth at bedtime.     . diphenhydrAMINE (BENADRYL) 25 mg capsule Take 25 mg by mouth 3 (three) times daily.    . fluticasone (FLONASE) 50 MCG/ACT nasal spray SPRAY 2 SPRAYS INTO EACH NOSTRIL EVERY DAY 16 g 3  . gabapentin (NEURONTIN) 300 MG capsule TAKE 1 CAPSULE BY MOUTH EVERY DAY AT NIGHT 90 capsule 7  . glucose blood (ACCU-CHEK ACTIVE STRIPS) test strip Use as instructed- up to once a day.  Dx E11.9 100 each 12  . lamoTRIgine (LAMICTAL) 25 MG tablet Take  3 tablets (75 mg total) by mouth daily. 270 tablet 0  . loperamide (IMODIUM A-D) 2 MG tablet Take 2 mg by mouth as needed.     . metoCLOPramide (REGLAN) 10 MG tablet TAKE 1 TABLET BY MOUTH TWICE A DAY 180 tablet 1  . omeprazole (PRILOSEC) 40 MG capsule Take 1 capsule (40 mg total) by mouth 2 (two) times daily. 180 capsule 3  .  polyethylene glycol powder (GLYCOLAX/MIRALAX) powder Take 1 Container by mouth once.    . primidone (MYSOLINE) 50 MG tablet TAKE 1 TABLET BY MOUTH THREE TIMES A DAY 270 tablet 1  . triamcinolone ointment (KENALOG) 0.1 % APPLY TO AFFECTED AREA 3-4 TIMES PER WEEK AS NEEDED  3  . venlafaxine XR (EFFEXOR-XR) 150 MG 24 hr capsule Take 1 capsule (150 mg total) by mouth daily. 90 capsule 0   No current facility-administered medications on file prior to visit.     Review of Systems:  As per HPI- otherwise negative.   Physical Examination: Vitals:   11/26/18 1046  BP: 122/60  Pulse: 62  Resp: 16  Temp: 98.1 F (36.7 [name])  SpO2: 98%   Vitals:   11/26/18 1046  Weight: 154 lb (69.9 kg)  Height: 5\' 3"  (1.6 m)   Body mass index is 27.28 kg/m. Ideal Body Weight: Weight in (lb) to have BMI = 25: 140.8  GEN: WDWN, NAD, Non-toxic, A & O x 3, normal weight, looks well.  Tends to stick with her jaw somewhat clenched.  As per her normal She has minimal if any rash on her right cheek at this time.  Nonspecific in appearance HEENT: Atraumatic, Normocephalic. Neck supple. No masses, No LAD.  Bilateral TM wnl, oropharynx normal.  PEERL,EOMI.   Tender at the left TMJ joint  Ears and Nose: No external deformity. CV: RRR, No M/G/R. No JVD. No thrill. No extra heart sounds. PULM: CTA B, no wheezes, crackles, rhonchi. No retractions. No resp. distress. No accessory muscle use. EXTR: No [name]/[name]/e NEURO Normal gait.  PSYCH: Normally interactive. Conversant. Not depressed or anxious appearing.  Calm demeanor.  Normal foot exam today   BP Readings from Last 3 Encounters:  11/26/18  122/60  06/01/18 136/70  03/25/18 107/69    Assessment and Plan: Type 2 diabetes mellitus without complication, without long-term current use of insulin (HCC) - Plan: Comprehensive metabolic panel, Hemoglobin A1c, Lipid panel, Microalbumin / creatinine urine ratio  History of gastric bypass - Plan: CBC, Ferritin, B12 and Folate Panel, Vitamin D (25 hydroxy)  Bilateral carotid artery stenosis  Tremor, hereditary, benign  Immunization due - Plan: Pneumococcal polysaccharide vaccine 23-valent greater than or equal to 2yo subcutaneous/IM  Age-related osteoporosis without current pathological fracture  Medication monitoring encounter - Plan: Ferritin, B12 and Folate Panel, Vitamin D (25 hydroxy)  B12 deficiency - Plan: B12 and Folate Panel  Iron deficiency - Plan: CBC, Ferritin  Vitamin D deficiency - Plan: Vitamin D (25 hydroxy)  Chronic pain of both knees - Plan: Ambulatory referral to Orthopedic Surgery  TMJ tenderness, left  Hyponatremia   Follow-up: No follow-ups on file.  Meds ordered this encounter  Medications  . Cholecalciferol (VITAMIN D3) 1.25 MG (50000 UT) CAPS    Sig: Take 1 weekly for 12 weeks    Dispense:  12 capsule    Refill:  0   Orders Placed This Encounter  Procedures  . Pneumococcal polysaccharide vaccine 23-valent greater than or equal to 2yo subcutaneous/IM  . CBC  . Comprehensive metabolic panel  . Hemoglobin A1c  . Lipid panel  . Ferritin  . B12 and Folate Panel  . Vitamin D (25 hydroxy)  . Microalbumin / creatinine urine ratio  . Ambulatory referral to Orthopedic Surgery    @SIGN @      Follow-up: No follow-ups on file. Following up today for a periodic recheck visit Given a pneumonia booster today I encouraged her to try a mouthguard for  her TMJ, she will think about it  Follow-up on osteoporosis today-due for Prolia next month Referral to orthopedics for her knees Will plan further follow- up pending labs.  Signed Lamar Blinks, MD  Received her labs, letter to patient  Results for orders placed or performed in visit on 11/26/18  CBC  Result Value Ref Range   WBC 3.8 (L) 4.0 - 10.5 K/uL   RBC 3.98 3.87 - 5.11 Mil/uL   Platelets 185.0 150.0 - 400.0 K/uL   Hemoglobin 12.8 12.0 - 15.0 g/dL   HCT 38.4 36.0 - 46.0 %   MCV 96.5 78.0 - 100.0 fl   MCHC 33.4 30.0 - 36.0 g/dL   RDW 13.2 11.5 - 15.5 %  Comprehensive metabolic panel  Result Value Ref Range   Sodium 130 (L) 135 - 145 mEq/L   Potassium 4.6 3.5 - 5.1 mEq/L   Chloride 96 96 - 112 mEq/L   CO2 26 19 - 32 mEq/L   Glucose, Bld 103 (H) 70 - 99 mg/dL   BUN 13 6 - 23 mg/dL   Creatinine, Ser 0.61 0.40 - 1.20 mg/dL   Total Bilirubin 0.6 0.2 - 1.2 mg/dL   Alkaline Phosphatase 81 39 - 117 U/L   AST 17 0 - 37 U/L   ALT 12 0 - 35 U/L   Total Protein 6.2 6.0 - 8.3 g/dL   Albumin 4.1 3.5 - 5.2 g/dL   Calcium 8.8 8.4 - 10.5 mg/dL   GFR 96.06 >60.00 mL/min  Hemoglobin A1c  Result Value Ref Range   Hgb A1c MFr Bld 5.6 4.6 - 6.5 %  Lipid panel  Result Value Ref Range   Cholesterol 173 0 - 200 mg/dL   Triglycerides 77.0 0.0 - 149.0 mg/dL   HDL 86.40 >39.00 mg/dL   VLDL 15.4 0.0 - 40.0 mg/dL   LDL Cholesterol 71 0 - 99 mg/dL   Total CHOL/HDL Ratio 2    NonHDL 86.50   Ferritin  Result Value Ref Range   Ferritin 15.7 10.0 - 291.0 ng/mL  B12 and Folate Panel  Result Value Ref Range   Vitamin B-12 >1500 (H) 211 - 911 pg/mL   Folate >23.9 >5.9 ng/mL  Vitamin D (25 hydroxy)  Result Value Ref Range   VITD 17.97 (L) 30.00 - 100.00 ng/mL  Microalbumin / creatinine urine ratio  Result Value Ref Range   Microalb, Ur <0.7 0.0 - 1.9 mg/dL   Creatinine,U 37.7 mg/dL   Microalb Creat Ratio 1.9 0.0 - 30.0 mg/g

## 2018-11-24 NOTE — Patient Instructions (Addendum)
It was nice to see you today, I will be in touch with your labs ASAP Please consider having the shingles vaccine, called Shingrix, given at your drugstore You got your 2nd pneumonia shot today- you are now done with these!    Try a mouth guard at night- this may help with your jaw pain (due to TMJ)

## 2018-11-26 ENCOUNTER — Other Ambulatory Visit: Payer: Self-pay

## 2018-11-26 ENCOUNTER — Ambulatory Visit (INDEPENDENT_AMBULATORY_CARE_PROVIDER_SITE_OTHER): Payer: Medicare Other | Admitting: Family Medicine

## 2018-11-26 ENCOUNTER — Encounter: Payer: Self-pay | Admitting: Family Medicine

## 2018-11-26 VITALS — BP 122/60 | HR 62 | Temp 98.1°F | Resp 16 | Ht 63.0 in | Wt 154.0 lb

## 2018-11-26 DIAGNOSIS — Z9884 Bariatric surgery status: Secondary | ICD-10-CM

## 2018-11-26 DIAGNOSIS — Z5181 Encounter for therapeutic drug level monitoring: Secondary | ICD-10-CM

## 2018-11-26 DIAGNOSIS — Z23 Encounter for immunization: Secondary | ICD-10-CM | POA: Diagnosis not present

## 2018-11-26 DIAGNOSIS — E119 Type 2 diabetes mellitus without complications: Secondary | ICD-10-CM

## 2018-11-26 DIAGNOSIS — G250 Essential tremor: Secondary | ICD-10-CM

## 2018-11-26 DIAGNOSIS — E871 Hypo-osmolality and hyponatremia: Secondary | ICD-10-CM

## 2018-11-26 DIAGNOSIS — M25561 Pain in right knee: Secondary | ICD-10-CM

## 2018-11-26 DIAGNOSIS — M26622 Arthralgia of left temporomandibular joint: Secondary | ICD-10-CM

## 2018-11-26 DIAGNOSIS — G8929 Other chronic pain: Secondary | ICD-10-CM

## 2018-11-26 DIAGNOSIS — M810 Age-related osteoporosis without current pathological fracture: Secondary | ICD-10-CM | POA: Diagnosis not present

## 2018-11-26 DIAGNOSIS — E559 Vitamin D deficiency, unspecified: Secondary | ICD-10-CM

## 2018-11-26 DIAGNOSIS — E538 Deficiency of other specified B group vitamins: Secondary | ICD-10-CM | POA: Diagnosis not present

## 2018-11-26 DIAGNOSIS — I6523 Occlusion and stenosis of bilateral carotid arteries: Secondary | ICD-10-CM | POA: Diagnosis not present

## 2018-11-26 DIAGNOSIS — E611 Iron deficiency: Secondary | ICD-10-CM

## 2018-11-26 DIAGNOSIS — M25562 Pain in left knee: Secondary | ICD-10-CM

## 2018-11-26 LAB — LIPID PANEL
Cholesterol: 173 mg/dL (ref 0–200)
HDL: 86.4 mg/dL (ref >39.00)
LDL Cholesterol: 71 mg/dL (ref 0–99)
NonHDL: 86.5
Total CHOL/HDL Ratio: 2
Triglycerides: 77 mg/dL (ref 0.0–149.0)
VLDL: 15.4 mg/dL (ref 0.0–40.0)

## 2018-11-26 LAB — COMPREHENSIVE METABOLIC PANEL
ALT: 12 U/L (ref 0–35)
AST: 17 U/L (ref 0–37)
Albumin: 4.1 g/dL (ref 3.5–5.2)
Alkaline Phosphatase: 81 U/L (ref 39–117)
BUN: 13 mg/dL (ref 6–23)
CO2: 26 mEq/L (ref 19–32)
Calcium: 8.8 mg/dL (ref 8.4–10.5)
Chloride: 96 mEq/L (ref 96–112)
Creatinine, Ser: 0.61 mg/dL (ref 0.40–1.20)
GFR: 96.06 mL/min (ref >60.00)
Glucose, Bld: 103 mg/dL — ABNORMAL HIGH (ref 70–99)
Potassium: 4.6 mEq/L (ref 3.5–5.1)
Sodium: 130 mEq/L — ABNORMAL LOW (ref 135–145)
Total Bilirubin: 0.6 mg/dL (ref 0.2–1.2)
Total Protein: 6.2 g/dL (ref 6.0–8.3)

## 2018-11-26 LAB — FERRITIN: Ferritin: 15.7 ng/mL (ref 10.0–291.0)

## 2018-11-26 LAB — HEMOGLOBIN A1C: Hgb A1c MFr Bld: 5.6 % (ref 4.6–6.5)

## 2018-11-26 LAB — CBC
HCT: 38.4 % (ref 36.0–46.0)
Hemoglobin: 12.8 g/dL (ref 12.0–15.0)
MCHC: 33.4 g/dL (ref 30.0–36.0)
MCV: 96.5 fl (ref 78.0–100.0)
Platelets: 185 10*3/uL (ref 150.0–400.0)
RBC: 3.98 Mil/uL (ref 3.87–5.11)
RDW: 13.2 % (ref 11.5–15.5)
WBC: 3.8 10*3/uL — ABNORMAL LOW (ref 4.0–10.5)

## 2018-11-26 LAB — B12 AND FOLATE PANEL
Folate: >23.9 ng/mL (ref >5.9)
Vitamin B-12: >1500 pg/mL — ABNORMAL HIGH (ref 211–911)

## 2018-11-26 LAB — MICROALBUMIN / CREATININE URINE RATIO
Creatinine,U: 37.7 mg/dL
Microalb Creat Ratio: 1.9 mg/g (ref 0.0–30.0)
Microalb, Ur: <0.7 mg/dL (ref 0.0–1.9)

## 2018-11-26 LAB — VITAMIN D 25 HYDROXY (VIT D DEFICIENCY, FRACTURES): VITD: 17.97 ng/mL — ABNORMAL LOW (ref 30.00–100.00)

## 2018-11-26 MED ORDER — VITAMIN D3 1.25 MG (50000 UT) PO CAPS: ORAL_CAPSULE | ORAL | 0 refills | Status: DC

## 2018-12-02 ENCOUNTER — Ambulatory Visit: Payer: Medicare Other | Admitting: Family Medicine

## 2018-12-03 ENCOUNTER — Ambulatory Visit: Payer: Self-pay | Admitting: Family Medicine

## 2018-12-07 ENCOUNTER — Other Ambulatory Visit: Payer: Self-pay | Admitting: Family Medicine

## 2018-12-07 NOTE — Telephone Encounter (Signed)
Cholecalciferol (VITAMIN D3) 1.25 MG (50000 UT) CAPS    Class for Rx is print and Pt stated pharmacy advised Rx was not signed/ Please resend to   [id number] [place], [place] [phone] (Phone) [phone] (Fax)

## 2018-12-08 MED ORDER — VITAMIN D3 1.25 MG (50000 UT) PO CAPS: ORAL_CAPSULE | ORAL | 4 refills | Status: DC

## 2018-12-08 NOTE — Telephone Encounter (Signed)
Medication sent to pharmacy  

## 2018-12-15 DIAGNOSIS — M25762 Osteophyte, left knee: Secondary | ICD-10-CM | POA: Diagnosis not present

## 2018-12-15 DIAGNOSIS — M170 Bilateral primary osteoarthritis of knee: Secondary | ICD-10-CM | POA: Diagnosis not present

## 2018-12-15 DIAGNOSIS — M25561 Pain in right knee: Secondary | ICD-10-CM | POA: Diagnosis not present

## 2018-12-15 DIAGNOSIS — M25761 Osteophyte, right knee: Secondary | ICD-10-CM | POA: Diagnosis not present

## 2018-12-15 DIAGNOSIS — M25562 Pain in left knee: Secondary | ICD-10-CM | POA: Diagnosis not present

## 2018-12-21 ENCOUNTER — Telehealth: Payer: Self-pay | Admitting: Family Medicine

## 2018-12-21 NOTE — Telephone Encounter (Signed)
I have re-verified patient in portal. Waiting on summary of benefits from patient.

## 2018-12-21 NOTE — Telephone Encounter (Signed)
Pt called to see if she can schedule Nurse Visit for Prolia. She notes being due for it 12/16/2018. Please advise.

## 2018-12-30 ENCOUNTER — Other Ambulatory Visit: Payer: Self-pay

## 2018-12-30 ENCOUNTER — Ambulatory Visit: Payer: Medicare Other

## 2018-12-30 DIAGNOSIS — M810 Age-related osteoporosis without current pathological fracture: Secondary | ICD-10-CM

## 2018-12-30 MED ORDER — DENOSUMAB 60 MG/ML ~~LOC~~ SOSY: 60.0000 mg | PREFILLED_SYRINGE | Freq: Once | SUBCUTANEOUS | Status: DC

## 2018-12-30 NOTE — Progress Notes (Signed)
Patient came in today per Dr. Lorelei Pont to have her prolia injection. Patient tolerated 60mg /mL SQ in her right arm, with no complications.

## 2019-01-04 ENCOUNTER — Other Ambulatory Visit: Payer: Self-pay | Admitting: Family Medicine

## 2019-01-04 DIAGNOSIS — G250 Essential tremor: Secondary | ICD-10-CM

## 2019-01-06 ENCOUNTER — Ambulatory Visit (INDEPENDENT_AMBULATORY_CARE_PROVIDER_SITE_OTHER): Payer: Medicare Other | Admitting: Family Medicine

## 2019-01-06 ENCOUNTER — Other Ambulatory Visit: Payer: Self-pay

## 2019-01-06 ENCOUNTER — Encounter: Payer: Self-pay | Admitting: Family Medicine

## 2019-01-06 VITALS — BP 104/68 | HR 82 | Temp 98.6°F | Ht 63.0 in | Wt 147.0 lb

## 2019-01-06 DIAGNOSIS — R21 Rash and other nonspecific skin eruption: Secondary | ICD-10-CM

## 2019-01-06 MED ORDER — CLINDAMYCIN PHOSPHATE 1 % EX GEL: Freq: Two times a day (BID) | CUTANEOUS | 0 refills | Status: DC

## 2019-01-06 NOTE — Progress Notes (Signed)
Chief Complaint  Patient presents with  . red spot under both eyes    Alexis [name] is a 73 y.o. female here for a skin complaint.  Duration: 3 days Location: under L eye, spreading to face and R thigh Pruritic? No Painful? No Drainage? No New soaps/lotions/topicals/detergents? No Sick contacts? No Other associated symptoms: more coming up on face Therapies tried thus far: TAO on arms, hydrocortisone (not helpful)  ROS:  Const: No fevers Skin: As noted in HPI  Past Medical History:  Diagnosis Date  . Allergy   . Anemia   . Arthritis   . Asthma   . Back pain   . Barrett's esophagus   . Bladder infection   . Cataract   . Depression   . Diabetes mellitus, type II ([place])   . Gallstones   . Gastritis   . GERD (gastroesophageal reflux disease)   . Hyperlipidemia   . Memory deficits   . Osteoporosis 12/09/2017  . PTSD (post-traumatic stress disorder)   . Sleep apnea   . Thyroid disease    Patient denies    BP 104/68 (BP Location: Left Arm, Patient Position: Sitting, Cuff Size: Normal)   Pulse 82   Temp 98.6 F (37 C) (Oral)   Ht 5\' 3"  (1.6 m)   Wt 147 lb (66.7 kg)   SpO2 97%   BMI 26.04 kg/m  Gen: awake, alert, appearing stated age Lungs: No accessory muscle use Skin: See below. No drainage, TTP, fluctuance. Psych: Age appropriate judgment and insight   R anterior thigh      Rash - Plan: areas on cheeks, minus the area under eye, resemble rosacea. Will tx with clinda. Not bothersome otherwise. Failed TAO and HC. F/u in 1 week, cancel appt if doing better. Would consider referral vs biopsy. Area under eye and leg looks like a bite.   The patient voiced understanding and agreement to the plan.  [place], DO 01/06/19 10:47 AM

## 2019-01-06 NOTE — Patient Instructions (Signed)
Continue using the triple antibiotic cream.  I am going to call in another cream to use with what you are already applying.  I want to see you in 1 week if no better. Cancel appointment if things are improving.  Let us know if you need anything.

## 2019-01-11 ENCOUNTER — Other Ambulatory Visit: Payer: Self-pay

## 2019-01-11 ENCOUNTER — Encounter (HOSPITAL_COMMUNITY): Payer: Self-pay | Admitting: Psychiatry

## 2019-01-11 ENCOUNTER — Ambulatory Visit (INDEPENDENT_AMBULATORY_CARE_PROVIDER_SITE_OTHER): Payer: Medicare Other | Admitting: Psychiatry

## 2019-01-11 DIAGNOSIS — F4310 Post-traumatic stress disorder, unspecified: Secondary | ICD-10-CM | POA: Diagnosis not present

## 2019-01-11 DIAGNOSIS — F331 Major depressive disorder, recurrent, moderate: Secondary | ICD-10-CM

## 2019-01-11 DIAGNOSIS — I6523 Occlusion and stenosis of bilateral carotid arteries: Secondary | ICD-10-CM | POA: Diagnosis not present

## 2019-01-11 MED ORDER — VENLAFAXINE HCL ER 150 MG PO CP24: 150.0000 mg | ORAL_CAPSULE | Freq: Every day | ORAL | 0 refills | Status: DC

## 2019-01-11 MED ORDER — LAMOTRIGINE 100 MG PO TABS: 100.0000 mg | ORAL_TABLET | Freq: Every day | ORAL | 0 refills | Status: DC
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
## 2019-01-12 ENCOUNTER — Ambulatory Visit: Payer: Medicare Other | Admitting: Family Medicine

## 2019-02-02 DIAGNOSIS — N763 Subacute and chronic vulvitis: Secondary | ICD-10-CM | POA: Diagnosis not present

## 2019-02-02 DIAGNOSIS — Z01419 Encounter for gynecological examination (general) (routine) without abnormal findings: Secondary | ICD-10-CM | POA: Diagnosis not present

## 2019-02-02 DIAGNOSIS — L280 Lichen simplex chronicus: Secondary | ICD-10-CM | POA: Diagnosis not present

## 2019-02-17 DIAGNOSIS — E871 Hypo-osmolality and hyponatremia: Secondary | ICD-10-CM | POA: Diagnosis not present

## 2019-03-03 DIAGNOSIS — Z23 Encounter for immunization: Secondary | ICD-10-CM | POA: Diagnosis not present

## 2019-03-05 ENCOUNTER — Encounter: Payer: Self-pay | Admitting: Family Medicine

## 2019-03-05 ENCOUNTER — Ambulatory Visit (INDEPENDENT_AMBULATORY_CARE_PROVIDER_SITE_OTHER): Payer: Medicare Other | Admitting: Family Medicine

## 2019-03-05 ENCOUNTER — Other Ambulatory Visit: Payer: Self-pay

## 2019-03-05 VITALS — BP 120/82 | HR 74 | Temp 96.5°F | Ht 64.0 in | Wt 154.0 lb

## 2019-03-05 DIAGNOSIS — K130 Diseases of lips: Secondary | ICD-10-CM

## 2019-03-05 MED ORDER — TRIAMCINOLONE ACETONIDE 0.025 % EX CREA: TOPICAL_CREAM | CUTANEOUS | 0 refills | Status: DC

## 2019-03-05 NOTE — Patient Instructions (Signed)
Stop Benadryl.  Stop the lip balm for now.   Let us know if you need anything.

## 2019-03-05 NOTE — Progress Notes (Signed)
Chief Complaint  Patient presents with  . problem with lips    Alexis [name] is a 73 y.o. female here for a skin complaint.  Duration: 2 months Location: lips Pruritic? No Painful? Yes Drainage? No New soaps/lotions/topicals/detergents? Yes- Vaseline Does not lick lips Other associated symptoms: none Therapies tried thus far: lip balm, Benadryl  ROS:  Const: No fevers Skin: As noted in HPI  Past Medical History:  Diagnosis Date  . Allergy   . Anemia   . Arthritis   . Asthma   . Back pain   . Barrett's esophagus   . Bladder infection   . Cataract   . Depression   . Diabetes mellitus, type II ([place])   . Gallstones   . Gastritis   . GERD (gastroesophageal reflux disease)   . Hyperlipidemia   . Memory deficits   . Osteoporosis 12/09/2017  . PTSD (post-traumatic stress disorder)   . Sleep apnea   . Thyroid disease    Patient denies    BP 120/82 (BP Location: Left Arm, Patient Position: Sitting, Cuff Size: Normal)   Pulse 74   Temp (!) 96.5 F (35.8 C) (Temporal)   Ht 5\' 4"  (1.626 m)   Wt 154 lb (69.9 kg)   SpO2 96%   BMI 26.43 kg/m  Gen: awake, alert, appearing stated age Lungs: No accessory muscle use Skin: see below.  Psych: Age appropriate judgment and insight     Cheilitis - Plan: triamcinolone (KENALOG) 0.025 % cream  Orders as above. Bid for 10 d. Stop Vaseline as she may have an allergy/intolerance to petroleum.  F/u prn. The patient voiced understanding and agreement to the plan.  [place], DO 03/05/19 1:15 PM

## 2019-03-29 DIAGNOSIS — H524 Presbyopia: Secondary | ICD-10-CM | POA: Diagnosis not present

## 2019-03-29 DIAGNOSIS — H40013 Open angle with borderline findings, low risk, bilateral: Secondary | ICD-10-CM | POA: Diagnosis not present

## 2019-03-29 DIAGNOSIS — H04123 Dry eye syndrome of bilateral lacrimal glands: Secondary | ICD-10-CM | POA: Diagnosis not present

## 2019-03-29 DIAGNOSIS — E119 Type 2 diabetes mellitus without complications: Secondary | ICD-10-CM | POA: Diagnosis not present

## 2019-03-29 DIAGNOSIS — H5203 Hypermetropia, bilateral: Secondary | ICD-10-CM | POA: Diagnosis not present

## 2019-03-29 DIAGNOSIS — Z961 Presence of intraocular lens: Secondary | ICD-10-CM | POA: Diagnosis not present

## 2019-03-29 DIAGNOSIS — H52201 Unspecified astigmatism, right eye: Secondary | ICD-10-CM | POA: Diagnosis not present

## 2019-03-29 DIAGNOSIS — H43813 Vitreous degeneration, bilateral: Secondary | ICD-10-CM | POA: Diagnosis not present

## 2019-03-30 NOTE — Progress Notes (Addendum)
[facility] [address], [address] [place], [place] [address] [phone]  Date:  04/01/2019   Name:  Alexis [name]   DOB:  06-Dec-1945   MRN:  [id number]  PCP:  Pearline Cablesopland, Carrisa Keller C, MD    Chief Complaint: Diabetes   History of Present Illness:  Alexis [name] is a 73 y.o. very pleasant female patient who presents with the following:  Here today for 3431-month follow-up History of diet controlled diabetes, gastric bypass in the year 2000, carotid artery stenosis, syncope, benign tremor, depression, foot drop Flu shot done   Seen by her GYN in August of this year She also follows up with her psychiatrist for depression, last seen by him in July  We most recently visited in June of this year She has history of hyponatremia, saw nephrology who thought she likely had hyponatremia due to SSRI medication She is taking vitamin D 1000 iu daily   [facility]  Eye exam due Mammogram - pt reports done last December.  She will do soon  She had full labs performed in June, low vitamin D but otherwise okay  She would like a HD placard due to knee pain which can make it harder for her to walk  She needs a refill of her gabapentin-300 mg at bedtime She takes primidone 50 3 times daily.  Wonders if we could increase her am dose to 100 mg, as her tremor is typically worse in the a.m.  She also has several from chapped lips recently.  She has what appears to be angular cheilitis bilaterally   Lab Results  Component Value Date   HGBA1C 5.6 11/26/2018    Patient Active Problem List   Diagnosis Date Noted  . Osteoporosis 12/09/2017  . Syncope 07/17/2017  . Shortness of breath 07/17/2017  . Hyponatremia 03/05/2017  . Lichen simplex chronicus 03/05/2017  . Type 2 diabetes mellitus without complication, without long-term current use of insulin (HCC) 03/05/2017  . Perianal lesion  01/31/2017  . Bilateral carotid artery stenosis 12/12/2016  . Restless legs 12/11/2016  . History of gastric bypass 12/11/2016  . PTSD (post-traumatic stress disorder) 12/11/2016  . 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 Risk for falls 12/10/2016  . Other dysphagia 08/19/2016  . Right foot drop 08/19/2016  . Chronic vulvitis 12/25/2015  . Microcalcification of left breast on mammogram 05/11/2015  . Tension type headache 06/14/2013  . Hypersomnia 04/13/2013    Past Medical History:  Diagnosis Date  . Allergy   . Anemia   . Arthritis   . Asthma   . Back pain   . Barrett's esophagus   . Bladder infection   . Cataract   . Depression   . Diabetes mellitus, type II (HCC)   . Gallstones   . Gastritis   . GERD (gastroesophageal reflux disease)   . Hyperlipidemia   . Memory deficits   . Osteoporosis 12/09/2017  . PTSD (post-traumatic stress disorder)   . Sleep apnea   . Thyroid disease    Patient denies    Past Surgical History:  Procedure Laterality Date  . CHOLECYSTECTOMY  1999  . COLONOSCOPY    . ESOPHAGOGASTRODUODENOSCOPY    . GANGLION CYST EXCISION Left   . GASTRIC BYPASS  2000  . TONSILLECTOMY AND ADENOIDECTOMY     age 73    Social History   Tobacco Use  . Smoking status: Never  Smoker  . Smokeless tobacco: Never Used  Substance Use Topics  . Alcohol use: No    Alcohol/week: 0.0 standard drinks  . Drug use: No    Family History  Problem Relation Age of Onset  . Depression Mother   . Heart disease Mother   . Kidney disease Mother   . Alcohol abuse Father   . Tremor Father   . Prostate cancer Father   . Other Father        Hypoglycemia  . Heart disease Father   . Diabetes Paternal Grandmother   . Other Daughter        stillborn  . Colon cancer Neg Hx   . Esophageal cancer Neg Hx   . Rectal cancer Neg Hx   . Stomach cancer Neg Hx     Allergies  Allergen Reactions  . Metformin   . Penicillins     Other reaction(s): HIVES    Medication  list has been reviewed and updated.  Current Outpatient Medications on File Prior to Visit  Medication Sig Dispense Refill  . aspirin (ASPIRIN EC) 81 MG EC tablet Take 81 mg by mouth at bedtime. Swallow whole.    . b complex vitamins tablet Take 1 tablet by mouth 2 (two) times daily.    [name] BIOTIN 5000 PO Take 1 tablet by mouth 2 (two) times daily.    . Cholecalciferol (VITAMIN D3) 1.25 MG (50000 UT) CAPS Take 1 weekly for 12 weeks 12 capsule 4  . clindamycin (CLINDAGEL) 1 % gel Apply topically 2 (two) times daily. 30 g 0  . Cyanocobalamin (VITAMIN B12 TR PO) Take 500 mcg by mouth at bedtime.     . diphenhydrAMINE (BENADRYL) 25 mg capsule Take 25 mg by mouth at bedtime.    . fluticasone (FLONASE) 50 MCG/ACT nasal spray SPRAY 2 SPRAYS INTO EACH NOSTRIL EVERY DAY 16 g 3  . gabapentin (NEURONTIN) 300 MG capsule TAKE 1 CAPSULE BY MOUTH EVERY DAY AT NIGHT 90 capsule 7  . glucose blood (ACCU-CHEK ACTIVE STRIPS) test strip Use as instructed- up to once a day.  Dx E11.9 100 each 12  . lamoTRIgine (LAMICTAL) 100 MG tablet Take 1 tablet (100 mg total) by mouth daily. 90 tablet 0  . loperamide (IMODIUM A-D) 2 MG tablet Take 2 mg by mouth as needed.     . meloxicam (MOBIC) 15 MG tablet Take 1 tablet (15 mg total) by mouth daily. 30 tablet 0  . metoCLOPramide (REGLAN) 10 MG tablet TAKE 1 TABLET BY MOUTH TWICE A DAY 180 tablet 1  . omeprazole (PRILOSEC) 40 MG capsule Take 1 capsule (40 mg total) by mouth 2 (two) times daily. 180 capsule 3  . polyethylene glycol powder (GLYCOLAX/MIRALAX) powder Take 1 Container by mouth once.    . primidone (MYSOLINE) 50 MG tablet TAKE 1 TABLET BY MOUTH THREE TIMES A DAY 270 tablet 1  . triamcinolone (KENALOG) 0.025 % cream Apply to lip twice daily for 10 days. 30 g 0  . venlafaxine XR (EFFEXOR-XR) 150 MG 24 hr capsule Take 1 capsule (150 mg total) by mouth daily. 90 capsule 0   Current Facility-Administered Medications on File Prior to Visit  Medication Dose Route Frequency  Provider Last Rate Last Dose  . denosumab (PROLIA) injection 60 mg  60 mg Subcutaneous Once Isabellarose Kope, Gwenlyn Found, MD        Review of Systems:  As per HPI- otherwise negative.   Physical Examination: Vitals:   04/01/19 1024  BP: 126/70  Pulse: 67  Resp: 16  Temp: (!) 95.9 F (35.5 C)  SpO2: 99%   Vitals:   04/01/19 1024  Weight: 149 lb (67.6 kg)  Height: 5\' 4"  (1.626 m)   Body mass index is 25.58 kg/m. Ideal Body Weight: Weight in (lb) to have BMI = 25: 145.3  GEN: WDWN, NAD, Non-toxic, A & O x 3, normal weight, looks well HEENT: Atraumatic, Normocephalic. Neck supple. No masses, No LAD.  Mild angular cheilitis at the bilateral mouth corners Ears and Nose: No external deformity. CV: RRR, No M/G/R. No JVD. No thrill. No extra heart sounds. PULM: CTA B, no wheezes, crackles, rhonchi. No retractions. No resp. distress. No accessory muscle use. ABD: S, NT, ND, +BS. No rebound. No HSM. EXTR: No c/c/e NEURO Normal gait for patient.  She has a tremor of her bilateral hands which is stable PSYCH: Normally interactive. Conversant. Not depressed or anxious appearing.  Calm demeanor.    Assessment and Plan: Mobility impaired  Tremor, hereditary, benign - Plan: gabapentin (NEURONTIN) 300 MG capsule, primidone (MYSOLINE) 50 MG tablet  Medication monitoring encounter - Plan: Folate, Primidone level, Basic metabolic panel  Vitamin D deficiency - Plan: Vitamin D (25 hydroxy)  Cheilitis  H/O gastric bypass - Plan: Folate  Other disorders of purine and pyrimidine metabolism ([place]) - Plan: Folate  Folate deficiency - Plan: Folate  Refill gabapentin, refilled primidone with increase of morning dose per patient's request- she will let me know how this works for her Primidone level, folate pending Discussed treatment of angular cheilitis with over-the-counter topical antifungal cream Handicap placard filled out today Check vitamin D  Will plan further follow- up pending  labs.   Signed Lamar Blinks, MD  Received her labs so far  Results for orders placed or performed in visit on 04/01/19  Vitamin D (25 hydroxy)  Result Value Ref Range   VITD 46.89 30.00 - 100.00 ng/mL  Folate  Result Value Ref Range   Folate 18.8 >5.9 ng/mL  Basic metabolic panel  Result Value Ref Range   Sodium 134 (L) 135 - 145 mEq/L   Potassium 4.2 3.5 - 5.1 mEq/L   Chloride 100 96 - 112 mEq/L   CO2 27 19 - 32 mEq/L   Glucose, Bld 101 (H) 70 - 99 mg/dL   BUN 10 6 - 23 mg/dL   Creatinine, Ser 0.65 0.40 - 1.20 mg/dL   Calcium 8.7 8.4 - 10.5 mg/dL   GFR 89.19 >60.00 mL/min   Await the rest of her results as she will need a letter

## 2019-03-31 ENCOUNTER — Other Ambulatory Visit: Payer: Self-pay

## 2019-03-31 NOTE — Patient Instructions (Addendum)
It was very nice to see you again, I would touch with your labs ASAP We can increase your primidone to 100 am, 50 noon and 50 supper If you want to try stopping the reglan and see if you notice a difference that is ok We gave you a handicapped placard form today- please take to your DMV  For the cracks in the corners of your mouth please try applying an OTC anti-fungal cream such as Lotrimin 2-3 times a day for 5-7 days

## 2019-04-01 ENCOUNTER — Ambulatory Visit (INDEPENDENT_AMBULATORY_CARE_PROVIDER_SITE_OTHER): Payer: Medicare Other | Admitting: Family Medicine

## 2019-04-01 ENCOUNTER — Other Ambulatory Visit: Payer: Self-pay

## 2019-04-01 ENCOUNTER — Encounter: Payer: Self-pay | Admitting: Family Medicine

## 2019-04-01 VITALS — BP 126/70 | HR 67 | Temp 95.9°F | Resp 16 | Ht 64.0 in | Wt 149.0 lb

## 2019-04-01 DIAGNOSIS — E559 Vitamin D deficiency, unspecified: Secondary | ICD-10-CM

## 2019-04-01 DIAGNOSIS — E798 Other disorders of purine and pyrimidine metabolism: Secondary | ICD-10-CM

## 2019-04-01 DIAGNOSIS — E538 Deficiency of other specified B group vitamins: Secondary | ICD-10-CM | POA: Diagnosis not present

## 2019-04-01 DIAGNOSIS — G250 Essential tremor: Secondary | ICD-10-CM

## 2019-04-01 DIAGNOSIS — Z5181 Encounter for therapeutic drug level monitoring: Secondary | ICD-10-CM | POA: Diagnosis not present

## 2019-04-01 DIAGNOSIS — I6523 Occlusion and stenosis of bilateral carotid arteries: Secondary | ICD-10-CM | POA: Diagnosis not present

## 2019-04-01 DIAGNOSIS — Z7409 Other reduced mobility: Secondary | ICD-10-CM | POA: Diagnosis not present

## 2019-04-01 DIAGNOSIS — Z9884 Bariatric surgery status: Secondary | ICD-10-CM

## 2019-04-01 DIAGNOSIS — K130 Diseases of lips: Secondary | ICD-10-CM | POA: Diagnosis not present

## 2019-04-01 LAB — BASIC METABOLIC PANEL
BUN: 10 mg/dL (ref 6–23)
CO2: 27 mEq/L (ref 19–32)
Calcium: 8.7 mg/dL (ref 8.4–10.5)
Chloride: 100 mEq/L (ref 96–112)
Creatinine, Ser: 0.65 mg/dL (ref 0.40–1.20)
GFR: 89.19 mL/min (ref >60.00)
Glucose, Bld: 101 mg/dL — ABNORMAL HIGH (ref 70–99)
Potassium: 4.2 mEq/L (ref 3.5–5.1)
Sodium: 134 mEq/L — ABNORMAL LOW (ref 135–145)

## 2019-04-01 LAB — FOLATE: Folate: 18.8 ng/mL (ref >5.9)

## 2019-04-01 LAB — VITAMIN D 25 HYDROXY (VIT D DEFICIENCY, FRACTURES): VITD: 46.89 ng/mL (ref 30.00–100.00)

## 2019-04-01 MED ORDER — PRIMIDONE 50 MG PO TABS: ORAL_TABLET | ORAL | 3 refills | Status: DC

## 2019-04-01 MED ORDER — GABAPENTIN 300 MG PO CAPS: ORAL_CAPSULE | ORAL | 3 refills | Status: DC

## 2019-04-04 ENCOUNTER — Other Ambulatory Visit (HOSPITAL_COMMUNITY): Payer: Self-pay | Admitting: Psychiatry

## 2019-04-04 DIAGNOSIS — F331 Major depressive disorder, recurrent, moderate: Secondary | ICD-10-CM

## 2019-04-04 DIAGNOSIS — F4310 Post-traumatic stress disorder, unspecified: Secondary | ICD-10-CM

## 2019-04-07 LAB — PRIMIDONE, SERUM
Phenobarbital: <5 mg/L — ABNORMAL LOW (ref 15.0–40.0)
Primidone, Serum: 5 mg/L (ref 5.0–12.0)

## 2019-04-08 ENCOUNTER — Encounter: Payer: Self-pay | Admitting: Family Medicine

## 2019-04-08 NOTE — Progress Notes (Signed)
Results for orders placed or performed in visit on 04/01/19  Vitamin D (25 hydroxy)  Result Value Ref Range   VITD 46.89 30.00 - 100.00 ng/mL  Folate  Result Value Ref Range   Folate 18.8 >5.9 ng/mL  Primidone level  Result Value Ref Range   Primidone, Serum 5.0 5.0 - 12.0 mg/L   Phenobarbital <5.0 (L) 15.0 - 40.0 mg/L  Basic metabolic panel  Result Value Ref Range   Sodium 134 (L) 135 - 145 mEq/L   Potassium 4.2 3.5 - 5.1 mEq/L   Chloride 100 96 - 112 mEq/L   CO2 27 19 - 32 mEq/L   Glucose, Bld 101 (H) 70 - 99 mg/dL   BUN 10 6 - 23 mg/dL   Creatinine, Ser 0.65 0.40 - 1.20 mg/dL   Calcium 8.7 8.4 - 10.5 mg/dL   GFR 89.19 >60.00 mL/min

## 2019-04-13 ENCOUNTER — Other Ambulatory Visit: Payer: Self-pay

## 2019-04-13 ENCOUNTER — Encounter (HOSPITAL_COMMUNITY): Payer: Self-pay | Admitting: Psychiatry

## 2019-04-13 ENCOUNTER — Ambulatory Visit (INDEPENDENT_AMBULATORY_CARE_PROVIDER_SITE_OTHER): Payer: Medicare Other | Admitting: Psychiatry

## 2019-04-13 DIAGNOSIS — F4310 Post-traumatic stress disorder, unspecified: Secondary | ICD-10-CM | POA: Diagnosis not present

## 2019-04-13 DIAGNOSIS — F331 Major depressive disorder, recurrent, moderate: Secondary | ICD-10-CM

## 2019-04-13 DIAGNOSIS — I6523 Occlusion and stenosis of bilateral carotid arteries: Secondary | ICD-10-CM | POA: Diagnosis not present

## 2019-04-13 MED ORDER — VENLAFAXINE HCL ER 150 MG PO CP24: 150.0000 mg | ORAL_CAPSULE | Freq: Every day | ORAL | 0 refills | Status: DC

## 2019-04-13 MED ORDER — LAMOTRIGINE 100 MG PO TABS: 100.0000 mg | ORAL_TABLET | Freq: Every day | ORAL | 0 refills | Status: DC
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
## 2019-04-24 ENCOUNTER — Other Ambulatory Visit: Payer: Self-pay | Admitting: Family Medicine

## 2019-05-12 ENCOUNTER — Other Ambulatory Visit: Payer: Self-pay

## 2019-05-26 DIAGNOSIS — E871 Hypo-osmolality and hyponatremia: Secondary | ICD-10-CM | POA: Diagnosis not present

## 2019-05-31 ENCOUNTER — Ambulatory Visit: Payer: Medicare Other | Admitting: Family Medicine

## 2019-07-02 ENCOUNTER — Encounter: Payer: Self-pay | Admitting: Family Medicine

## 2019-07-02 DIAGNOSIS — Z1231 Encounter for screening mammogram for malignant neoplasm of breast: Secondary | ICD-10-CM | POA: Diagnosis not present

## 2019-07-13 DIAGNOSIS — E119 Type 2 diabetes mellitus without complications: Secondary | ICD-10-CM | POA: Diagnosis not present

## 2019-07-13 DIAGNOSIS — I6523 Occlusion and stenosis of bilateral carotid arteries: Secondary | ICD-10-CM | POA: Diagnosis not present

## 2019-07-14 ENCOUNTER — Ambulatory Visit (INDEPENDENT_AMBULATORY_CARE_PROVIDER_SITE_OTHER): Payer: Medicare Other | Admitting: Psychiatry

## 2019-07-14 ENCOUNTER — Encounter (HOSPITAL_COMMUNITY): Payer: Self-pay | Admitting: Psychiatry

## 2019-07-14 ENCOUNTER — Other Ambulatory Visit: Payer: Self-pay

## 2019-07-14 DIAGNOSIS — F4310 Post-traumatic stress disorder, unspecified: Secondary | ICD-10-CM

## 2019-07-14 DIAGNOSIS — Z7982 Long term (current) use of aspirin: Secondary | ICD-10-CM

## 2019-07-14 DIAGNOSIS — F331 Major depressive disorder, recurrent, moderate: Secondary | ICD-10-CM

## 2019-07-14 MED ORDER — LAMOTRIGINE 100 MG PO TABS: 100.0000 mg | ORAL_TABLET | Freq: Every day | ORAL | 0 refills | Status: DC

## 2019-07-14 MED ORDER — VENLAFAXINE HCL ER 150 MG PO CP24: 150.0000 mg | ORAL_CAPSULE | Freq: Every day | ORAL | 0 refills | Status: DC
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
## 2019-07-21 ENCOUNTER — Other Ambulatory Visit: Payer: Self-pay

## 2019-07-21 ENCOUNTER — Ambulatory Visit (INDEPENDENT_AMBULATORY_CARE_PROVIDER_SITE_OTHER): Payer: Medicare Other

## 2019-07-21 DIAGNOSIS — M810 Age-related osteoporosis without current pathological fracture: Secondary | ICD-10-CM | POA: Diagnosis not present

## 2019-07-21 MED ORDER — DENOSUMAB 60 MG/ML ~~LOC~~ SOSY
60.0000 mg | PREFILLED_SYRINGE | Freq: Once | SUBCUTANEOUS | Status: AC
  Administered 2019-07-21: 10:00:00 60 mg via SUBCUTANEOUS

## 2019-07-27 NOTE — Progress Notes (Signed)
I reviewed the above note regarding Prolia injection, and agree J. Rhylan Gross, MD

## 2019-08-23 NOTE — Progress Notes (Signed)
Nurse connected with patient 08/24/19 at 11:45 AM EST by a telephone enabled telemedicine application and verified that I am speaking with the correct person using two identifiers. Patient stated full name and DOB. Patient gave permission to continue with virtual visit. Patient's location was at home and Nurse's location was at [place] office.   Subjective:   Alexis [name] is a 74 y.o. female who presents for an Initial Medicare Annual Wellness Visit.  Review of Systems    Home Safety/Smoke Alarms: Feels safe in home. Smoke alarms in place.  Lives alone. 1 story. Reports she has great neighbors.   Female:   Mammo-  07/02/19     Dexa scan-  12/04/17      CCS- 03/25/18. No longer doing routine screening due to age. Eye-Dr.Evans every 6 months. UTD per pt.     Objective:      Advanced Directives 08/24/2019 11/04/2017  Does Patient Have a Medical Advance Directive? No No  Would patient like information on creating a medical advance directive? No - Patient declined -  Some encounter information is confidential and restricted. Go to Review Flowsheets activity to see all data.    Current Medications (verified) Outpatient Encounter Medications as of 08/24/2019  Medication Sig  . aspirin (ASPIRIN EC) 81 MG EC tablet Take 81 mg by mouth at bedtime. Swallow whole.  . b complex vitamins tablet Take 1 tablet by mouth 2 (two) times daily.  [name] BIOTIN 5000 PO Take 1 tablet by mouth 2 (two) times daily.  . Cholecalciferol (VITAMIN D3) 1.25 MG (50000 UT) CAPS Take 1 weekly for 12 weeks  . clindamycin (CLINDAGEL) 1 % gel Apply topically 2 (two) times daily.  . Cyanocobalamin (VITAMIN B12 TR PO) Take 500 mcg by mouth at bedtime.   . diphenhydrAMINE (BENADRYL) 25 mg capsule Take 25 mg by mouth at bedtime.  . fluticasone (FLONASE) 50 MCG/ACT nasal spray SPRAY 2 SPRAYS INTO EACH NOSTRIL EVERY DAY  . gabapentin (NEURONTIN) 300 MG capsule TAKE 1 CAPSULE BY MOUTH EVERY DAY AT NIGHT  . glucose blood  (ACCU-CHEK ACTIVE STRIPS) test strip Use as instructed- up to once a day.  Dx E11.9  . lamoTRIgine (LAMICTAL) 100 MG tablet Take 1 tablet (100 mg total) by mouth daily.  [name] loperamide (IMODIUM A-D) 2 MG tablet Take 2 mg by mouth as needed.   . meloxicam (MOBIC) 15 MG tablet Take 1 tablet (15 mg total) by mouth daily.  [name] omeprazole (PRILOSEC) 40 MG capsule Take 1 capsule (40 mg total) by mouth 2 (two) times daily.  . polyethylene glycol powder (GLYCOLAX/MIRALAX) powder Take 1 Container by mouth once.  . primidone (MYSOLINE) 50 MG tablet Take 100 mg in the am, 50 at noon at 50 at supper  . triamcinolone (KENALOG) 0.025 % cream Apply to lip twice daily for 10 days.  [name] venlafaxine XR (EFFEXOR-XR) 150 MG 24 hr capsule Take 1 capsule (150 mg total) by mouth daily.  . metoCLOPramide (REGLAN) 10 MG tablet TAKE 1 TABLET BY MOUTH TWICE A DAY (Patient not taking: Reported on 08/24/2019)   Facility-Administered Encounter Medications as of 08/24/2019  Medication  . denosumab (PROLIA) injection 60 mg    Allergies (verified) Metformin and Penicillins   History: Past Medical History:  Diagnosis Date  . Allergy   . Anemia   . Arthritis   . Asthma   . Back pain   . Barrett's esophagus   . Bladder infection   . Cataract   . Depression   .  Diabetes mellitus, type II (HCC)   . Gallstones   . Gastritis   . GERD (gastroesophageal reflux disease)   . Hyperlipidemia   . Memory deficits   . Osteoporosis 12/09/2017  . PTSD (post-traumatic stress disorder)   . Sleep apnea   . Thyroid disease    Patient denies   Past Surgical History:  Procedure Laterality Date  . CHOLECYSTECTOMY  1999  . COLONOSCOPY    . ESOPHAGOGASTRODUODENOSCOPY    . GANGLION CYST EXCISION Left   . GASTRIC BYPASS  2000  . TONSILLECTOMY AND ADENOIDECTOMY     age 47   Family History  Problem Relation Age of Onset  . Depression Mother   . Heart disease Mother   . Kidney disease Mother   . Alcohol abuse Father   . Tremor  Father   . Prostate cancer Father   . Other Father        Hypoglycemia  . Heart disease Father   . Diabetes Paternal Grandmother   . Other Daughter        stillborn  . Colon cancer Neg Hx   . Esophageal cancer Neg Hx   . Rectal cancer Neg Hx   . Stomach cancer Neg Hx    Social History   Socioeconomic History  . Marital status: Unknown    Spouse name: Not on file  . Number of children: 3  . Years of education: Not on file  . Highest education level: Not on file  Occupational History  . Occupation: retired  Tobacco Use  . Smoking status: Never Smoker  . Smokeless tobacco: Never Used  Substance and Sexual Activity  . Alcohol use: No    Alcohol/week: 0.0 standard drinks  . Drug use: No  . Sexual activity: Never  Other Topics Concern  . Not on file  Social History Narrative   Married second time after a divorce   1 son and 1 daughter living 1 daughter was stillborn   3 caffeine (coffee)/day   Retired from Development worker, community Determinants of Corporate investment banker Strain:   . Difficulty of Paying Living Expenses: Not on file  Food Insecurity:   . Worried About Programme researcher, broadcasting/film/video in the Last Year: Not on file  . Ran Out of Food in the Last Year: Not on file  Transportation Needs:   . Lack of Transportation (Medical): Not on file  . Lack of Transportation (Non-Medical): Not on file  Physical Activity:   . Days of Exercise per Week: Not on file  . Minutes of Exercise per Session: Not on file  Stress:   . Feeling of Stress : Not on file  Social Connections:   . Frequency of Communication with Friends and Family: Not on file  . Frequency of Social Gatherings with Friends and Family: Not on file  . Attends Religious Services: Not on file  . Active Member of Clubs or Organizations: Not on file  . Attends Banker Meetings: Not on file  . Marital Status: Not on file    Tobacco Counseling Counseling given: Not Answered   Clinical Intake: Pain :  No/denies pain    Activities of Daily Living In your present state of health, do you have any difficulty performing the following activities: 08/24/2019  Hearing? N  Vision? N  Difficulty concentrating or making decisions? N  Walking or climbing stairs? N  Dressing or bathing? N  Doing errands, shopping? N  Preparing Food and eating ?  N  Using the Toilet? N  In the past six months, have you accidently leaked urine? N  Do you have problems with loss of bowel control? N  Managing your Medications? N  Managing your Finances? N  Housekeeping or managing your Housekeeping? N  Some recent data might be hidden     Immunizations and Health Maintenance Immunization History  Administered Date(s) Administered  . Influenza, High Dose Seasonal PF 04/06/2018, 03/03/2019  . Influenza-Unspecified 03/04/2019  . Pneumococcal Conjugate-13 12/01/2017  . Pneumococcal Polysaccharide-23 11/26/2018  . Tdap 12/01/2017   Health Maintenance Due  Topic Date Due  . HEMOGLOBIN A1C  05/28/2019    Patient Care Team: [name], Gwenlyn Found, MD as PCP - General (Family Medicine)  Indicate any recent Medical Services you may have received from other than [facility] providers in the past year (date may be approximate).     Assessment:   This is a routine wellness examination for Elma. Physical assessment deferred to PCP.  Hearing/Vision screen Unable to assess. This visit is enabled though telemedicine due to Covid 19.   Dietary issues and exercise activities discussed: Current Exercise Habits: Home exercise routine, Type of exercise: walking, Time (Minutes): 30, Frequency (Times/Week): 7, Weekly Exercise (Minutes/Week): 210, Intensity: Mild, Exercise limited by: None identified Diet (meal preparation, eat out, water intake, caffeinated beverages, dairy products, fruits and vegetables): in general, a "healthy" diet  , well balanced  Goals    . Maintain health and independence      Depression Screen PHQ  2/9 Scores 08/24/2019 03/12/2017  PHQ - 2 Score 0 0  Exception Documentation - Patient refusal    Fall Risk Fall Risk  08/24/2019 05/12/2019 03/12/2017  Falls in the past year? 0 0 Yes  Comment - Emmi Telephone Survey: data to providers prior to load -  Number falls in past yr: 0 - 1  Injury with Fall? 0 - Yes  Follow up Education provided;Falls prevention discussed - Falls prevention discussed   Cognitive Function:   Ad8 score reviewed for issues:  Issues making decisions:no  Less interest in hobbies / activities:no  Repeats questions, stories (family complaining):no  Trouble using ordinary gadgets (microwave, computer, phone):no  Forgets the month or year: no  Mismanaging finances: no  Remembering appts:no  Daily problems with thinking and/or memory:no Ad8 score is=0     Screening Tests Health Maintenance  Topic Date Due  . HEMOGLOBIN A1C  05/28/2019  . FOOT EXAM  11/26/2019  . URINE MICROALBUMIN  11/26/2019  . OPHTHALMOLOGY EXAM  03/28/2020  . MAMMOGRAM  07/01/2021  . TETANUS/TDAP  12/02/2027  . INFLUENZA VACCINE  Completed  . DEXA SCAN  Completed  . Hepatitis C Screening  Completed  . PNA vac Low Risk Adult  Completed  . COLONOSCOPY  Discontinued       Plan:    Please schedule your next medicare wellness visit with me in 1 yr.  Continue to eat heart healthy diet (full of fruits, vegetables, whole grains, lean protein, water--limit salt, fat, and sugar intake) and increase physical activity as tolerated.  Continue doing brain stimulating activities (puzzles, reading, adult coloring books, staying active) to keep memory sharp.     I have personally reviewed and noted the following in the patient's chart:   . Medical and social history . Use of alcohol, tobacco or illicit drugs  . Current medications and supplements . Functional ability and status . Nutritional status . Physical activity . Advanced directives . List of other  physicians .  Hospitalizations, surgeries, and ER visits in previous 12 months . Vitals . Screenings to include cognitive, depression, and falls . Referrals and appointments  In addition, I have reviewed and discussed with patient certain preventive protocols, quality metrics, and best practice recommendations. A written personalized care plan for preventive services as well as general preventive health recommendations were provided to patient.     Avon Gully, [place]   08/24/2019

## 2019-08-24 ENCOUNTER — Encounter: Payer: Self-pay | Admitting: *Deleted

## 2019-08-24 ENCOUNTER — Other Ambulatory Visit: Payer: Self-pay

## 2019-08-24 ENCOUNTER — Ambulatory Visit (INDEPENDENT_AMBULATORY_CARE_PROVIDER_SITE_OTHER): Payer: Medicare Other | Admitting: *Deleted

## 2019-08-24 DIAGNOSIS — Z0000 Encounter for general adult medical examination without abnormal findings: Secondary | ICD-10-CM | POA: Diagnosis not present

## 2019-08-24 NOTE — Patient Instructions (Signed)
Please schedule your next medicare wellness visit with me in 1 yr.  Continue to eat heart healthy diet (full of fruits, vegetables, whole grains, lean protein, water--limit salt, fat, and sugar intake) and increase physical activity as tolerated.  Continue doing brain stimulating activities (puzzles, reading, adult coloring books, staying active) to keep memory sharp.    Alexis [name] , Thank you for taking time to come for your Medicare Wellness Visit. I appreciate your ongoing commitment to your health goals. Please review the following plan we discussed and let me know if I can assist you in the future.   These are the goals we discussed: Goals    . Maintain health and independence       This is a list of the screening recommended for you and due dates:  Health Maintenance  Topic Date Due  . Hemoglobin A1C  05/28/2019  . Complete foot exam   11/26/2019  . Urine Protein Check  11/26/2019  . Eye exam for diabetics  03/28/2020  . Mammogram  07/01/2021  . Tetanus Vaccine  12/02/2027  . Flu Shot  Completed  . DEXA scan (bone density measurement)  Completed  .  Hepatitis C: One time screening is recommended by Center for Disease Control  (CDC) for  adults born from [birth year] through 1965.   Completed  . Pneumonia vaccines  Completed  . Colon Cancer Screening  Discontinued    Preventive Care 11 Years and Older, Female Preventive care refers to lifestyle choices and visits with your health care provider that can promote health and wellness. This includes:  A yearly physical exam. This is also called an annual well check.  Regular dental and eye exams.  Immunizations.  Screening for certain conditions.  Healthy lifestyle choices, such as diet and exercise. What can I expect for my preventive care visit? Physical exam Your health care provider will check:  Height and weight. These may be used to calculate body mass index (BMI), which is a measurement that tells if you are at a  healthy weight.  Heart rate and blood pressure.  Your skin for abnormal spots. Counseling Your health care provider may ask you questions about:  Alcohol, tobacco, and drug use.  Emotional well-being.  Home and relationship well-being.  Sexual activity.  Eating habits.  History of falls.  Memory and ability to understand (cognition).  Work and work Statistician.  Pregnancy and menstrual history. What immunizations do I need?  Influenza (flu) vaccine  This is recommended every year. Tetanus, diphtheria, and pertussis (Tdap) vaccine  You may need a Td booster every 10 years. Varicella (chickenpox) vaccine  You may need this vaccine if you have not already been vaccinated. Zoster (shingles) vaccine  You may need this after age 21. Pneumococcal conjugate (PCV13) vaccine  One dose is recommended after age 74. Pneumococcal polysaccharide (PPSV23) vaccine  One dose is recommended after age 9. Measles, mumps, and rubella (MMR) vaccine  You may need at least one dose of MMR if you were born in 1957 or later. You may also need a second dose. Meningococcal conjugate (MenACWY) vaccine  You may need this if you have certain conditions. Hepatitis A vaccine  You may need this if you have certain conditions or if you travel or work in places where you may be exposed to hepatitis A. Hepatitis B vaccine  You may need this if you have certain conditions or if you travel or work in places where you may be exposed to hepatitis B.  Haemophilus influenzae type b (Hib) vaccine  You may need this if you have certain conditions. You may receive vaccines as individual doses or as more than one vaccine together in one shot (combination vaccines). Talk with your health care provider about the risks and benefits of combination vaccines. What tests do I need? Blood tests  Lipid and cholesterol levels. These may be checked every 5 years, or more frequently depending on your overall  health.  Hepatitis C test.  Hepatitis B test. Screening  Lung cancer screening. You may have this screening every year starting at age 12 if you have a 30-pack-year history of smoking and currently smoke or have quit within the past 15 years.  Colorectal cancer screening. All adults should have this screening starting at age 62 and continuing until age 53. Your health care provider may recommend screening at age 59 if you are at increased risk. You will have tests every 1-10 years, depending on your results and the type of screening test.  Diabetes screening. This is done by checking your blood sugar (glucose) after you have not eaten for a while (fasting). You may have this done every 1-3 years.  Mammogram. This may be done every 1-2 years. Talk with your health care provider about how often you should have regular mammograms.  BRCA-related cancer screening. This may be done if you have a family history of breast, ovarian, tubal, or peritoneal cancers. Other tests  Sexually transmitted disease (STD) testing.  Bone density scan. This is done to screen for osteoporosis. You may have this done starting at age 63. Follow these instructions at home: Eating and drinking  Eat a diet that includes fresh fruits and vegetables, whole grains, lean protein, and low-fat dairy products. Limit your intake of foods with high amounts of sugar, saturated fats, and salt.  Take vitamin and mineral supplements as recommended by your health care provider.  Do not drink alcohol if your health care provider tells you not to drink.  If you drink alcohol: ? Limit how much you have to 0-1 drink a day. ? Be aware of how much alcohol is in your drink. In the [place] one drink equals one 12 oz bottle of beer (355 mL), one 5 oz glass of wine (148 mL), or one 1 oz glass of hard liquor (44 mL). Lifestyle  Take daily care of your teeth and gums.  Stay active. Exercise for at least 30 minutes on 5 or more days  each week.  Do not use any products that contain nicotine or tobacco, such as cigarettes, e-cigarettes, and chewing tobacco. If you need help quitting, ask your health care provider.  If you are sexually active, practice safe sex. Use a condom or other form of protection in order to prevent STIs (sexually transmitted infections).  Talk with your health care provider about taking a low-dose aspirin or statin. What's next?  Go to your health care provider once a year for a well check visit.  Ask your health care provider how often you should have your eyes and teeth checked.  Stay up to date on all vaccines. This information is not intended to replace advice given to you by your health care provider. Make sure you discuss any questions you have with your health care provider. Document Revised: 05/28/2018 Document Reviewed: 05/28/2018 Elsevier Patient Education  2020 [employer].

## 2019-08-25 ENCOUNTER — Other Ambulatory Visit: Payer: Self-pay

## 2019-08-25 NOTE — Progress Notes (Addendum)
[facility] [address], [place], [place] [address] [phone]  Date:  08/26/2019   Name:  Alexis [name]   DOB:  [birth year]   MRN:  [id number]  PCP:  Darreld Mclean, MD    Chief Complaint: Diabetes (3 month follow up)   History of Present Illness:  Alexis [name] is a 74 y.o. very pleasant female patient who presents with the following:  Follow-up visit today for this patient with history of diet-controlled diabetes, gastric bypass, carotid artery stenosis, osteoporosis, benign tremor, depression, foot drop, lichen simplex chronicus  She tends to have hyponatremia, nephrology has felt most likely due to medications  Last seen by myself in October Her psychiatrist is Dr. Adele Schilder She was seen by [facility] vascular surgery in [place] looks like they plan for a follow-up scan; per care everywhere this was done on January 26, I cannot see the actual report  The pt notes that her vascular doctor suggested that she see a neurologist to discuss her unusual speech - she tends to talk with her teeth clenched   Needs A1c today Can also do foot exam Patient Active Problem List   Diagnosis Date Noted  . Osteoporosis 12/09/2017  . Syncope 07/17/2017  . Shortness of breath 07/17/2017  . Hyponatremia 03/05/2017  . Lichen simplex chronicus 03/05/2017  . Type 2 diabetes mellitus without complication, without long-term current use of insulin ([place]) 03/05/2017  . Perianal lesion 01/31/2017  . Bilateral carotid artery stenosis 12/12/2016  . Restless legs 12/11/2016  . History of gastric bypass 12/11/2016  . PTSD (post-traumatic stress disorder) 12/11/2016  . 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 Risk for falls 12/10/2016  . Other dysphagia 08/19/2016  . Right foot drop 08/19/2016  . Chronic vulvitis 12/25/2015  . Microcalcification of left breast on mammogram 05/11/2015  . Tension type headache 06/14/2013  .  Hypersomnia 04/13/2013    Past Medical History:  Diagnosis Date  . Allergy   . Anemia   . Arthritis   . Asthma   . Back pain   . Barrett's esophagus   . Bladder infection   . Cataract   . Depression   . Diabetes mellitus, type II ([place])   . Gallstones   . Gastritis   . GERD (gastroesophageal reflux disease)   . Hyperlipidemia   . Memory deficits   . Osteoporosis 12/09/2017  . PTSD (post-traumatic stress disorder)   . Sleep apnea   . Thyroid disease    Patient denies    Past Surgical History:  Procedure Laterality Date  . CHOLECYSTECTOMY  1999  . COLONOSCOPY    . ESOPHAGOGASTRODUODENOSCOPY    . GANGLION CYST EXCISION Left   . GASTRIC BYPASS  2000  . TONSILLECTOMY AND ADENOIDECTOMY     age 37    Social History   Tobacco Use  . Smoking status: Never Smoker  . Smokeless tobacco: Never Used  Substance Use Topics  . Alcohol use: No    Alcohol/week: 0.0 standard drinks  . Drug use: No    Family History  Problem Relation Age of Onset  . Depression Mother   . Heart disease Mother   . Kidney disease Mother   . Alcohol abuse Father   . Tremor Father   . Prostate cancer Father   . Other Father        Hypoglycemia  . Heart disease Father   . Diabetes Paternal Grandmother   . Other  Daughter        stillborn  . Colon cancer Neg Hx   . Esophageal cancer Neg Hx   . Rectal cancer Neg Hx   . Stomach cancer Neg Hx     Allergies  Allergen Reactions  . Metformin   . Penicillins     Other reaction(s): HIVES    Medication list has been reviewed and updated.  Current Outpatient Medications on File Prior to Visit  Medication Sig Dispense Refill  . aspirin (ASPIRIN EC) 81 MG EC tablet Take 81 mg by mouth at bedtime. Swallow whole.    . b complex vitamins tablet Take 1 tablet by mouth 2 (two) times daily.    [name] BIOTIN 5000 PO Take 1 tablet by mouth 2 (two) times daily.    . Cholecalciferol (VITAMIN D3) 1.25 MG (50000 UT) CAPS Take 1 weekly for 12 weeks 12 capsule 4   . clindamycin (CLINDAGEL) 1 % gel Apply topically 2 (two) times daily. 30 g 0  . Cyanocobalamin (VITAMIN B12 TR PO) Take 500 mcg by mouth at bedtime.     . diphenhydrAMINE (BENADRYL) 25 mg capsule Take 25 mg by mouth at bedtime.    . fluticasone (FLONASE) 50 MCG/ACT nasal spray SPRAY 2 SPRAYS INTO EACH NOSTRIL EVERY DAY 16 g 3  . gabapentin (NEURONTIN) 300 MG capsule TAKE 1 CAPSULE BY MOUTH EVERY DAY AT NIGHT 90 capsule 3  . glucose blood (ACCU-CHEK ACTIVE STRIPS) test strip Use as instructed- up to once a day.  Dx E11.9 100 each 12  . lamoTRIgine (LAMICTAL) 100 MG tablet Take 1 tablet (100 mg total) by mouth daily. 90 tablet 0  . loperamide (IMODIUM A-D) 2 MG tablet Take 2 mg by mouth as needed.     . meloxicam (MOBIC) 15 MG tablet Take 1 tablet (15 mg total) by mouth daily. 30 tablet 0  . metoCLOPramide (REGLAN) 10 MG tablet TAKE 1 TABLET BY MOUTH TWICE A DAY 180 tablet 1  . omeprazole (PRILOSEC) 40 MG capsule Take 1 capsule (40 mg total) by mouth 2 (two) times daily. 180 capsule 3  . polyethylene glycol powder (GLYCOLAX/MIRALAX) powder Take 1 Container by mouth once.    . primidone (MYSOLINE) 50 MG tablet Take 100 mg in the am, 50 at noon at 50 at supper 360 tablet 3  . triamcinolone (KENALOG) 0.025 % cream Apply to lip twice daily for 10 days. 30 g 0  . venlafaxine XR (EFFEXOR-XR) 150 MG 24 hr capsule Take 1 capsule (150 mg total) by mouth daily. 90 capsule 0   Current Facility-Administered Medications on File Prior to Visit  Medication Dose Route Frequency Provider Last Rate Last Admin  . denosumab (PROLIA) injection 60 mg  60 mg Subcutaneous Once Armentha Branagan, Gwenlyn Found, MD        Review of Systems:  As per HPI- otherwise negative.   Physical Examination: Vitals:   08/26/19 1020  BP: 124/76  Pulse: 64  Resp: 17  Temp: (!) 96.3 F (35.7 C)  SpO2: 99%   Vitals:   08/26/19 1020  Weight: 144 lb (65.3 kg)  Height: 5\' 4"  (1.626 m)   Body mass index is 24.72 kg/m. Ideal Body  Weight: Weight in (lb) to have BMI = 25: 145.3  GEN: no acute distress.  Normal weight, looks well and her normal self today  She tends to talk with her teeth clenched, this is baseline for her HEENT: Atraumatic, Normocephalic.  Ears and Nose: No external deformity. CV: RRR,  No M/G/R. No JVD. No thrill. No extra heart sounds. PULM: CTA B, no wheezes, crackles, rhonchi. No retractions. No resp. distress. No accessory muscle use. ABD: S, NT, ND, +BS. No rebound. No HSM. EXTR: No c/c/e PSYCH: Normally interactive. Conversant.  Foot exam; normal today   Assessment and Plan: Type 2 diabetes mellitus without complication, without long-term current use of insulin (HCC) - Plan: Hemoglobin A1c, Basic metabolic panel, CBC  Penicillin allergy - Plan: Ambulatory referral to Allergy  Difficulty with speech - Plan: Ambulatory referral to Neurology  Following up today Will check her A1c and other labs as below She would like to be tested for penicillin allergy to see if she is still truly allergic She would like a neurology evaluation for her speech- will place referral for her today  Moderate medical decision today This visit occurred during the SARS-CoV-2 public health emergency.  Safety protocols were in place, including screening questions prior to the visit, additional usage of staff PPE, and extensive cleaning of exam room while observing appropriate contact time as indicated for disinfecting solutions.     Signed Abbe Amsterdam, MD  Received her labs, message to patient  Results for orders placed or performed in visit on 08/26/19  Hemoglobin A1c  Result Value Ref Range   Hgb A1c MFr Bld 5.3 4.6 - 6.5 %  Basic metabolic panel  Result Value Ref Range   Sodium 131 (L) 135 - 145 mEq/L   Potassium 4.6 3.5 - 5.1 mEq/L   Chloride 98 96 - 112 mEq/L   CO2 28 19 - 32 mEq/L   Glucose, Bld 107 (H) 70 - 99 mg/dL   BUN 13 6 - 23 mg/dL   Creatinine, Ser 7.34 0.40 - 1.20 mg/dL   GFR 28.76  >81.15 mL/min   Calcium 8.4 8.4 - 10.5 mg/dL  CBC  Result Value Ref Range   WBC 5.4 4.0 - 10.5 K/uL   RBC 3.61 (L) 3.87 - 5.11 Mil/uL   Platelets 199.0 150.0 - 400.0 K/uL   Hemoglobin 11.6 (L) 12.0 - 15.0 g/dL   HCT 72.6 (L) 20.3 - 55.9 %   MCV 94.6 78.0 - 100.0 fl   MCHC 34.0 30.0 - 36.0 g/dL   RDW 74.1 63.8 - 45.3 %

## 2019-08-26 ENCOUNTER — Other Ambulatory Visit: Payer: Self-pay

## 2019-08-26 ENCOUNTER — Encounter: Payer: Self-pay | Admitting: Family Medicine

## 2019-08-26 ENCOUNTER — Ambulatory Visit (INDEPENDENT_AMBULATORY_CARE_PROVIDER_SITE_OTHER): Payer: Medicare Other | Admitting: Family Medicine

## 2019-08-26 VITALS — BP 124/76 | HR 64 | Temp 96.3°F | Resp 17 | Ht 64.0 in | Wt 144.0 lb

## 2019-08-26 DIAGNOSIS — Z880 Allergy status to penicillin: Secondary | ICD-10-CM

## 2019-08-26 DIAGNOSIS — R479 Unspecified speech disturbances: Secondary | ICD-10-CM

## 2019-08-26 DIAGNOSIS — E119 Type 2 diabetes mellitus without complications: Secondary | ICD-10-CM | POA: Diagnosis not present

## 2019-08-26 DIAGNOSIS — E871 Hypo-osmolality and hyponatremia: Secondary | ICD-10-CM | POA: Diagnosis not present

## 2019-08-26 LAB — CBC
HCT: 34.2 % — ABNORMAL LOW (ref 36.0–46.0)
Hemoglobin: 11.6 g/dL — ABNORMAL LOW (ref 12.0–15.0)
MCHC: 34 g/dL (ref 30.0–36.0)
MCV: 94.6 fl (ref 78.0–100.0)
Platelets: 199 10*3/uL (ref 150.0–400.0)
RBC: 3.61 Mil/uL — ABNORMAL LOW (ref 3.87–5.11)
RDW: 13.7 % (ref 11.5–15.5)
WBC: 5.4 10*3/uL (ref 4.0–10.5)

## 2019-08-26 LAB — HEMOGLOBIN A1C: Hgb A1c MFr Bld: 5.3 % (ref 4.6–6.5)

## 2019-08-26 LAB — BASIC METABOLIC PANEL
BUN: 13 mg/dL (ref 6–23)
CO2: 28 mEq/L (ref 19–32)
Calcium: 8.4 mg/dL (ref 8.4–10.5)
Chloride: 98 mEq/L (ref 96–112)
Creatinine, Ser: 0.61 mg/dL (ref 0.40–1.20)
GFR: 95.86 mL/min (ref >60.00)
Glucose, Bld: 107 mg/dL — ABNORMAL HIGH (ref 70–99)
Potassium: 4.6 mEq/L (ref 3.5–5.1)
Sodium: 131 mEq/L — ABNORMAL LOW (ref 135–145)

## 2019-08-26 NOTE — Patient Instructions (Signed)
Good to see you again today- I will be in touch with your labs asap We can plan for a 6 month visit Referral to allergy to see if you are stiil allergic to penicillin Referral to neurology as well

## 2019-09-08 ENCOUNTER — Other Ambulatory Visit: Payer: Self-pay

## 2019-09-08 ENCOUNTER — Encounter (HOSPITAL_COMMUNITY): Payer: Self-pay | Admitting: Psychiatry

## 2019-09-08 ENCOUNTER — Ambulatory Visit (INDEPENDENT_AMBULATORY_CARE_PROVIDER_SITE_OTHER): Payer: Medicare Other | Admitting: Psychiatry

## 2019-09-08 DIAGNOSIS — Z7982 Long term (current) use of aspirin: Secondary | ICD-10-CM | POA: Diagnosis not present

## 2019-09-08 DIAGNOSIS — F4310 Post-traumatic stress disorder, unspecified: Secondary | ICD-10-CM | POA: Diagnosis not present

## 2019-09-08 DIAGNOSIS — F331 Major depressive disorder, recurrent, moderate: Secondary | ICD-10-CM | POA: Diagnosis not present

## 2019-09-08 MED ORDER — VENLAFAXINE HCL ER 150 MG PO CP24: 150.0000 mg | ORAL_CAPSULE | Freq: Every day | ORAL | 0 refills | Status: DC

## 2019-09-08 MED ORDER — LAMOTRIGINE 100 MG PO TABS: 100.0000 mg | ORAL_TABLET | Freq: Every day | ORAL | 0 refills | Status: DC
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
## 2019-09-21 ENCOUNTER — Ambulatory Visit: Payer: Medicare Other | Admitting: Pediatrics

## 2019-09-23 ENCOUNTER — Ambulatory Visit: Payer: Medicare Other | Admitting: Neurology

## 2019-09-27 DIAGNOSIS — H40013 Open angle with borderline findings, low risk, bilateral: Secondary | ICD-10-CM | POA: Diagnosis not present

## 2019-09-27 DIAGNOSIS — H04123 Dry eye syndrome of bilateral lacrimal glands: Secondary | ICD-10-CM | POA: Diagnosis not present

## 2019-09-27 DIAGNOSIS — H5000 Unspecified esotropia: Secondary | ICD-10-CM | POA: Diagnosis not present

## 2019-10-11 ENCOUNTER — Encounter: Payer: Self-pay | Admitting: Pediatrics

## 2019-10-11 ENCOUNTER — Ambulatory Visit (INDEPENDENT_AMBULATORY_CARE_PROVIDER_SITE_OTHER): Payer: Medicare Other | Admitting: Pediatrics

## 2019-10-11 ENCOUNTER — Other Ambulatory Visit: Payer: Self-pay

## 2019-10-11 VITALS — BP 136/74 | HR 66 | Temp 98.7°F | Resp 16 | Ht 64.0 in | Wt 147.0 lb

## 2019-10-11 DIAGNOSIS — M810 Age-related osteoporosis without current pathological fracture: Secondary | ICD-10-CM

## 2019-10-11 DIAGNOSIS — T50905D Adverse effect of unspecified drugs, medicaments and biological substances, subsequent encounter: Secondary | ICD-10-CM | POA: Diagnosis not present

## 2019-10-11 DIAGNOSIS — J3089 Other allergic rhinitis: Secondary | ICD-10-CM | POA: Diagnosis not present

## 2019-10-11 DIAGNOSIS — E119 Type 2 diabetes mellitus without complications: Secondary | ICD-10-CM

## 2019-10-11 DIAGNOSIS — T360X5D Adverse effect of penicillins, subsequent encounter: Secondary | ICD-10-CM

## 2019-10-11 DIAGNOSIS — Z9884 Bariatric surgery status: Secondary | ICD-10-CM

## 2019-10-11 DIAGNOSIS — K219 Gastro-esophageal reflux disease without esophagitis: Secondary | ICD-10-CM

## 2019-10-11 NOTE — Patient Instructions (Addendum)
Environmental control of dust and mold Benadryl 25 mg-take 1 tablet  at night for runny nose.  If you need to take something in the morning try Claritin 10 mg - 1 tablet in the morning for runny nose Fluticasone 1 spray per nostril once a day if needed for stuffy nose   Your testing for penicillin allergy was negative today.  I gave you a prescription for amoxicillin 250 mg per 5 mL to try in the office to make sure that you can tolerate it.  Bring the prescription with you tomorrow.  Do not take Benadryl tonight  Since you have to take omeprazole 40 mg twice a day, I recommend that you take calcium citrate for your calcium replacement

## 2019-10-11 NOTE — Progress Notes (Signed)
[place] [address] Dept: [phone]  New Patient Note  Patient ID: Alexis [name], female    DOB: May 30, 1946  Age: 74 y.o. MRN: [id number] Date of Office Visit: 10/11/2019 Referring provider: Darreld Mclean, MD [address],  [place] [address]    Chief Complaint: Allergies  HPI Miakoda Mcmillion presents for an allergy evaluation.  35 to 40 years ago she was given an injection of penicillin and developed large local swelling.  She was then given a subsequent injection and developed generalized hives.  She has avoided all types of penicillin since then.  Her father and her brother were allergic to penicillin. She moved to [place] from [place] 6 years ago and has developed difficulties from a runny nose and a stuffy nose.  Her symptoms are worse in the springtime.  She has been using Benadryl 25 mg at night and fluticasone 1 spray per nostril once a day if needed.  She has chronic gastroesophageal reflux and takes omeprazole 40 mg twice a day.  She had asthma in childhood.  She has not had further difficulties from asthma.  She has not had chronic urticaria or eczema.  Review of Systems  Constitutional: Negative.   HENT:       Seasonal allergic rhinitis in [place] for several years.  History of tonsillectomy and adenoidectomy  Eyes:       Lens implants  Respiratory:       Asthma in childhood only  Cardiovascular:       Bilateral carotid artery stenosis.  History of syncope  Gastrointestinal:       Gastroesophageal reflux.  Gastrectomy.  History of gastric bypass  Genitourinary: Negative.   Musculoskeletal:       Osteoporosis history of a broken ankle.  Ganglion cyst in the left wrist.  Mild arthritis  Skin:       Hives from penicillin injection about 40 years ago  Neurological:       Mild tremor  Endo/Heme/Allergies:       Type 2 diabetes for 15 years.  No thyroid disease  Psychiatric/Behavioral: Negative.      Outpatient Encounter Medications as of 10/11/2019  Medication Sig  . aspirin (ASPIRIN EC) 81 MG EC tablet Take 81 mg by mouth at bedtime. Swallow whole.  . b complex vitamins tablet Take 1 tablet by mouth 2 (two) times daily.  [name] BIOTIN 5000 PO Take 1 tablet by mouth 2 (two) times daily.  . Cholecalciferol (VITAMIN D3) 1.25 MG (50000 UT) CAPS Take 1 weekly for 12 weeks  . clindamycin (CLINDAGEL) 1 % gel Apply topically 2 (two) times daily.  . Cyanocobalamin (VITAMIN B12 TR PO) Take 500 mcg by mouth at bedtime.   . diphenhydrAMINE (BENADRYL) 25 mg capsule Take 25 mg by mouth at bedtime.  . fluticasone (FLONASE) 50 MCG/ACT nasal spray SPRAY 2 SPRAYS INTO EACH NOSTRIL EVERY DAY  . gabapentin (NEURONTIN) 300 MG capsule TAKE 1 CAPSULE BY MOUTH EVERY DAY AT NIGHT  . glucose blood (ACCU-CHEK ACTIVE STRIPS) test strip Use as instructed- up to once a day.  Dx E11.9  . lamoTRIgine (LAMICTAL) 100 MG tablet Take 1 tablet (100 mg total) by mouth daily.  [name] loperamide (IMODIUM A-D) 2 MG tablet Take 2 mg by mouth as needed.   . meloxicam (MOBIC) 15 MG tablet Take 1 tablet (15 mg total) by mouth daily.  . metoCLOPramide (REGLAN) 10 MG tablet TAKE 1 TABLET BY MOUTH TWICE A  DAY  . omeprazole (PRILOSEC) 40 MG capsule Take 1 capsule (40 mg total) by mouth 2 (two) times daily.  . polyethylene glycol powder (GLYCOLAX/MIRALAX) powder Take 1 Container by mouth once.  . primidone (MYSOLINE) 50 MG tablet Take 100 mg in the am, 50 at noon at 50 at supper  . triamcinolone (KENALOG) 0.025 % cream Apply to lip twice daily for 10 days.  [name] venlafaxine XR (EFFEXOR-XR) 150 MG 24 hr capsule Take 1 capsule (150 mg total) by mouth daily.   Facility-Administered Encounter Medications as of 10/11/2019  Medication  . denosumab (PROLIA) injection 60 mg     Drug Allergies:  Allergies  Allergen Reactions  . Metformin Diarrhea  . Penicillins     Other reaction(s): HIVES    Family History: Jadore's family history includes  Alcohol abuse in her father; Asthma in her father; Depression in her mother; Diabetes in her paternal grandmother; Heart disease in her father and mother; Kidney disease in her mother; Other in her daughter and father; Prostate cancer in her father; Tremor in her father..  Family history is positive for asthma .  Penicillin allergy,  food allergies.  Family history is negative for hayfever, sinus problems, angioedema, eczema, hives, chronic bronchitis or emphysema.  Social and environmental.  She has a cat in the home.  She is not exposed to cigarette smoking.  She has never smoked cigarettes in the past.  She is retired.  Physical Exam: BP 136/74   Pulse 66   Temp 98.7 F (37.1 C) (Oral)   Resp 16   Ht 5\' 4"  (1.626 m)   Wt 147 lb (66.7 kg)   SpO2 97%   BMI 25.23 kg/m    Physical Exam Vitals reviewed.  Constitutional:      Appearance: Normal appearance. She is normal weight.  HENT:     Head:     Comments: Eyes normal.  Ears normal.  Nose mild swelling nasal turbinates.  Pharynx normal. Neck:     Comments: No thyromegaly Cardiovascular:     Rate and Rhythm: Normal rate and regular rhythm.     Comments: S1-S2 normal no murmurs Pulmonary:     Comments: Clear to percussion and auscultation Abdominal:     Palpations: Abdomen is soft.     Tenderness: There is no abdominal tenderness.     Comments: No hepatosplenomegaly  Musculoskeletal:     Cervical back: Neck supple.  Lymphadenopathy:     Cervical: No cervical adenopathy.  Skin:    Comments: Clear  Neurological:     General: No focal deficit present.     Mental Status: She is alert and oriented to person, place, and time. Mental status is at baseline.  Psychiatric:        Mood and Affect: Mood normal.        Behavior: Behavior normal.        Thought Content: Thought content normal.        Judgment: Judgment normal.     Diagnostics:    Assessment  Assessment and Plan: 1. Adverse effect of drug, subsequent encounter    2. Adverse reaction to penicillin, subsequent encounter   3. Non-seasonal allergic rhinitis due to other allergic trigger   4. Gastroesophageal reflux disease without esophagitis   5. Type 2 diabetes mellitus without complication, without long-term current use of insulin (HCC)   6. Age-related osteoporosis without current pathological fracture   7. History of gastric bypass     No orders of the defined  types were placed in this encounter.   Patient Instructions  Environmental control of dust and mold Benadryl 25 mg-take 1 tablet  at night for runny nose.  If you need to take something in the morning try Claritin 10 mg - 1 tablet in the morning for runny nose Fluticasone 1 spray per nostril once a day if needed for stuffy nose   Your testing for penicillin allergy was negative today.  I gave you a prescription for amoxicillin 250 mg per 5 mL to try in the office to make sure that you can tolerate it.  Bring the prescription with you tomorrow.  Do not take Benadryl tonight  Since you have to take omeprazole 40 mg twice a day, I recommend that you take calcium citrate for your calcium replacement   Return in about 1 day (around 10/12/2019).   Thank you for the opportunity to care for this patient.  Please do not hesitate to contact me with questions.  Tonette Bihari, M.D.  [facility] [address] [place], [place] [address] [phone]

## 2019-10-12 ENCOUNTER — Ambulatory Visit (INDEPENDENT_AMBULATORY_CARE_PROVIDER_SITE_OTHER): Payer: Medicare Other | Admitting: Family Medicine

## 2019-10-12 ENCOUNTER — Encounter: Payer: Self-pay | Admitting: Family Medicine

## 2019-10-12 VITALS — BP 138/64 | HR 64 | Resp 16

## 2019-10-12 DIAGNOSIS — K219 Gastro-esophageal reflux disease without esophagitis: Secondary | ICD-10-CM

## 2019-10-12 DIAGNOSIS — J3089 Other allergic rhinitis: Secondary | ICD-10-CM

## 2019-10-12 DIAGNOSIS — T360X5D Adverse effect of penicillins, subsequent encounter: Secondary | ICD-10-CM | POA: Diagnosis not present

## 2019-10-12 DIAGNOSIS — E119 Type 2 diabetes mellitus without complications: Secondary | ICD-10-CM

## 2019-10-12 DIAGNOSIS — M810 Age-related osteoporosis without current pathological fracture: Secondary | ICD-10-CM

## 2019-10-12 NOTE — Patient Instructions (Addendum)
Take amoxicillin 250 mg per 5 mL-1 teaspoonful this afternoon and 1 teaspoonful tonight For the next 2 days take amoxicillin 250 mg per 5 mL-1 teaspoonful 3 times a day , then stop. If you do not get hives or a  rash over the next 4 days, I feel that your physician can use penicillin, amoxicillin and cephalosporins if indicated for any infections that you may have in the future. If you get a rash that does not itch please let us know to look at it   Continue on your other medications  Call us if you are not doing well on this treatment plan

## 2019-10-12 NOTE — Progress Notes (Signed)
  [address] [place] [address] Dept: [phone]  FOLLOW UP NOTE  Patient ID: Alexis [name], female    DOB: January 29, 1946  Age: 74 y.o. MRN: [id number] Date of Office Visit: 10/12/2019  Assessment  Chief Complaint: Allergy Testing (drug challenge Amoxicillin 250mg /80ml)  HPI Alexis [name] presents for a gradual oral challenge to amoxicillin to see if she can have penicillin-like products in the future   Drug Allergies:  Allergies  Allergen Reactions  . Metformin Diarrhea  . Penicillins     Other reaction(s): HIVES    Physical Exam: BP 138/64 (BP Location: Left Arm, Patient Position: Sitting, Cuff Size: Normal)   Pulse 64   Resp 16   SpO2 99%    Physical Exam Vitals reviewed.  Constitutional:      Appearance: Normal appearance. She is normal weight.  HENT:     Head:     Comments: Eyes normal.  Ears normal.  Nose normal.  Pharynx normal. Cardiovascular:     Rate and Rhythm: Normal rate and regular rhythm.     Comments: S1-S2 normal no murmurs Pulmonary:     Comments: Clear to percussion and auscultation Lymphadenopathy:     Cervical: No cervical adenopathy.  Skin:    Comments: Clear  Neurological:     General: No focal deficit present.     Mental Status: She is alert and oriented to person, place, and time. Mental status is at baseline.  Psychiatric:        Mood and Affect: Mood normal.        Behavior: Behavior normal.        Thought Content: Thought content normal.        Judgment: Judgment normal.     Diagnostics: She was given amoxicillin 125 mg 1 hour apart and she tolerated the oral challenge without any allergic symptoms  Assessment and Plan: 1. Adverse reaction to penicillin, subsequent encounter   2. Non-seasonal allergic rhinitis due to other allergic trigger   3. Type 2 diabetes mellitus without complication, without [name]-term current use of insulin (HCC)   4. Gastroesophageal reflux disease without esophagitis   5. Age-related  osteoporosis without current pathological fracture     No orders of the defined types were placed in this encounter.   Patient Instructions  Take amoxicillin 250 mg per 5 mL-1 teaspoonful this afternoon and 1 teaspoonful tonight For the next 2 days take amoxicillin 250 mg per 5 mL-1 teaspoonful 3 times a day , then stop. If you do not get hives or a  rash over the next 4 days, I feel that your physician can use penicillin, amoxicillin and cephalosporins if indicated for any infections that you may have in the future. If you get a rash that does not itch please let [name] know to look at it   Continue on your other medications  Call us if you are not doing well on this treatment plan     Return if symptoms worsen or fail to improve.    Thank you for the opportunity to care for this patient.  Please do not hesitate to contact me with questions.  [place], M.D.  [facility] [address] [place], [place] [phone]

## 2019-10-25 ENCOUNTER — Encounter: Payer: Self-pay | Admitting: Neurology

## 2019-10-25 ENCOUNTER — Ambulatory Visit (INDEPENDENT_AMBULATORY_CARE_PROVIDER_SITE_OTHER): Payer: Medicare Other | Admitting: Neurology

## 2019-10-25 ENCOUNTER — Other Ambulatory Visit: Payer: Self-pay

## 2019-10-25 ENCOUNTER — Telehealth: Payer: Self-pay | Admitting: Neurology

## 2019-10-25 VITALS — BP 143/69 | HR 62 | Temp 96.9°F | Ht 64.0 in | Wt 145.3 lb

## 2019-10-25 DIAGNOSIS — R479 Unspecified speech disturbances: Secondary | ICD-10-CM

## 2019-10-25 NOTE — Progress Notes (Signed)
Subjective:    Patient ID: Alexis [name] is a 74 y.o. female.  HPI     Star Age, MD, PhD [facility] [address], [address] [place], [place] [address]  Dear Dr. Lorelei Pont,   I saw your patient, Alexis [name], upon your kind request in my neurologic clinic today for initial consultation of her speech problem, concern for dysarthria.  The patient is unaccompanied today.  As you know, Alexis [name] is a 74 year old right-handed woman with an underlying medical history of thyroid disease, PTSD, sleep apnea, hyperlipidemia, reflux disease, history of gastritis, diabetes, depression, asthma, arthritis, anemia, allergies, status post weight loss surgery, who reports intermittent difficulty with her speech. I reviewed your office note from 08/26/2019. Difficulty with her speech has been off and on for the past several months, maybe over a year.  When she was seen by her vascular specialist in January, she was noted to talk with her teeth clenched.  She was advised to seek consultation with neurology.  She denies any sudden onset of one-sided weakness or numbness or tingling or droopy face or slurring of speech otherwise, has had intermittent speech difficulty without any obvious weakness, reports some difficulty with her left lower face at times.  Denies any recurrent headaches.  Had a carotid Doppler ultrasound through vascular surgery in January 2021 and overall stenosis was deemed less than 40% bilaterally.  She has a longstanding history of tremors and has been on Mysoline for this.  She has history of difficulty swallowing at times and has seen GI.  She has been on Reglan for many years, she is not sure how many years, she takes 10 mg twice daily. She is divorced, she lives alone, she is a non-smoker and does not drink alcohol but likes to drink caffeine in the form of black coffee, up to 6 cups/day on average.  Her Past Medical History Is Significant For: Past  Medical History:  Diagnosis Date  . Allergy   . Anemia   . Arthritis   . Asthma   . Back pain   . Barrett's esophagus   . Bladder infection   . Cataract   . Depression   . Diabetes mellitus, type II ([place])   . Gallstones   . Gastritis   . GERD (gastroesophageal reflux disease)   . Hyperlipidemia   . Memory deficits   . Osteoporosis 12/09/2017  . PTSD (post-traumatic stress disorder)   . Sleep apnea   . Thyroid disease    Patient denies    Her Past Surgical History Is Significant For: Past Surgical History:  Procedure Laterality Date  . CHOLECYSTECTOMY  1999  . COLONOSCOPY    . ESOPHAGOGASTRODUODENOSCOPY    . GANGLION CYST EXCISION Left   . GASTRIC BYPASS  2000  . TONSILLECTOMY AND ADENOIDECTOMY     age 60    Her Family History Is Significant For: Family History  Problem Relation Age of Onset  . Depression Mother   . Heart disease Mother   . Kidney disease Mother   . Alcohol abuse Father   . Tremor Father   . Prostate cancer Father   . Other Father        Hypoglycemia  . Heart disease Father   . Asthma Father   . Diabetes Paternal Grandmother   . Other Daughter        stillborn  . Colon cancer Neg Hx   . Esophageal cancer Neg Hx   . Rectal cancer Neg  Hx   . Stomach cancer Neg Hx   . Allergic rhinitis Neg Hx   . Eczema Neg Hx   . Urticaria Neg Hx   . Immunodeficiency Neg Hx   . Angioedema Neg Hx     Her Social History Is Significant For: Social History   Socioeconomic History  . Marital status: Unknown    Spouse name: Not on file  . Number of children: 3  . Years of education: Not on file  . Highest education level: Not on file  Occupational History  . Occupation: retired  Tobacco Use  . Smoking status: Never Smoker  . Smokeless tobacco: Never Used  Substance and Sexual Activity  . Alcohol use: No    Alcohol/week: 0.0 standard drinks  . Drug use: No  . Sexual activity: Never  Other Topics Concern  . Not on file  Social History Narrative    Married second time after a divorce   1 son and 1 daughter living 1 daughter was stillborn   3 caffeine (coffee)/day   Retired from Engineer, mining of Corporate investment banker Strain: Low Risk   . Difficulty of Paying Living Expenses: Not hard at all  Food Insecurity: No Food Insecurity  . Worried About Programme researcher, broadcasting/film/video in the Last Year: Never true  . Ran Out of Food in the Last Year: Never true  Transportation Needs: No Transportation Needs  . Lack of Transportation (Medical): No  . Lack of Transportation (Non-Medical): No  Physical Activity:   . Days of Exercise per Week:   . Minutes of Exercise per Session:   Stress:   . Feeling of Stress :   Social Connections:   . Frequency of Communication with Friends and Family:   . Frequency of Social Gatherings with Friends and Family:   . Attends Religious Services:   . Active Member of Clubs or Organizations:   . Attends Banker Meetings:   [name] Marital Status:     Her Allergies Are:  Allergies  Allergen Reactions  . Metformin Diarrhea  . Penicillins     Other reaction(s): HIVES  :   Her Current Medications Are:  Outpatient Encounter Medications as of 10/25/2019  Medication Sig  . aspirin (ASPIRIN EC) 81 MG EC tablet Take 81 mg by mouth at bedtime. Swallow whole.  . b complex vitamins tablet Take 1 tablet by mouth 2 (two) times daily.  [name] BIOTIN 5000 PO Take 1 tablet by mouth 2 (two) times daily.  . calcium carbonate (OSCAL) 1500 (600 Ca) MG TABS tablet Take by mouth.  . Cholecalciferol (VITAMIN D3) 1.25 MG (50000 UT) CAPS Take 1 weekly for 12 weeks  . clindamycin (CLINDAGEL) 1 % gel Apply topically 2 (two) times daily.  . Cyanocobalamin (VITAMIN B12 TR PO) Take 500 mcg by mouth at bedtime.   . diphenhydrAMINE (BENADRYL) 25 mg capsule Take 25 mg by mouth at bedtime.  . fluticasone (FLONASE) 50 MCG/ACT nasal spray SPRAY 2 SPRAYS INTO EACH NOSTRIL EVERY DAY  . gabapentin (NEURONTIN) 300 MG  capsule TAKE 1 CAPSULE BY MOUTH EVERY DAY AT NIGHT  . glucose blood (ACCU-CHEK ACTIVE STRIPS) test strip Use as instructed- up to once a day.  Dx E11.9  . lamoTRIgine (LAMICTAL) 100 MG tablet Take 1 tablet (100 mg total) by mouth daily.  [name] loperamide (IMODIUM A-D) 2 MG tablet Take 2 mg by mouth as needed.   . meloxicam (MOBIC) 15 MG tablet Take 1 tablet (  15 mg total) by mouth daily.  . metoCLOPramide (REGLAN) 10 MG tablet TAKE 1 TABLET BY MOUTH TWICE A DAY  . omeprazole (PRILOSEC) 40 MG capsule Take 1 capsule (40 mg total) by mouth 2 (two) times daily.  . polyethylene glycol powder (GLYCOLAX/MIRALAX) powder Take 1 Container by mouth once.  . primidone (MYSOLINE) 50 MG tablet Take 100 mg in the am, 50 at noon at 50 at supper  . triamcinolone (KENALOG) 0.025 % cream Apply to lip twice daily for 10 days.  [name] venlafaxine XR (EFFEXOR-XR) 150 MG 24 hr capsule Take 1 capsule (150 mg total) by mouth daily.   Facility-Administered Encounter Medications as of 10/25/2019  Medication  . denosumab (PROLIA) injection 60 mg  : Review of Systems:  Out of a complete 14 point review of systems, all are reviewed and negative with the exception of these symptoms as listed below:  Review of Systems  Neurological:       Here for consult on slurred of speech. Pt reports 2-3 months ago she had slurring of speech a long with left side facial weakness. Pt reports sx have resolved some but still present to some degree.     Objective:  Neurological Exam  Physical Exam Physical Examination:   Vitals:   10/25/19 1429  BP: (!) 143/69  Pulse: 62  Temp: (!) 96.9 F (36.1 C)    General Examination: The patient is a very pleasant 74 y.o. female in no acute distress. She appears well-developed and well-nourished and well groomed.   HEENT: Normocephalic, atraumatic, pupils are equal, round and reactive to light and accommodation. Funduscopic exam is normal with sharp disc margins noted.  She is status post  bilateral cataract repairs, she has slight increase in nuchal tone, does tend to speak with her teeth clenched or mouth almost closed, this is intermittent, no lip, neck or jaw tremor.  Perhaps mild facial masking noted.  She has no voice tremor.  No carotid bruits on auscultation, airway examination reveals mild to moderate mouth dryness, tongue protrudes centrally and palate elevates symmetrically.  Extraocular tracking is preserved.    Chest: Clear to auscultation without wheezing, rhonchi or crackles noted.  Heart: S1+S2+0, regular and normal without murmurs, rubs or gallops noted.   Abdomen: Soft, non-tender and non-distended with normal bowel sounds appreciated on auscultation.  Extremities: There is no pitting edema in the distal lower extremities bilaterally. Pedal pulses are intact.  Skin: Warm and dry without trophic changes noted.  Musculoskeletal: exam reveals no obvious joint deformities, tenderness or joint swelling or erythema.   Neurologically:  Mental status: The patient is awake, alert and oriented in all 4 spheres. Her immediate and remote memory, attention, language skills and fund of knowledge are appropriate. There is no evidence of aphasia, agnosia, apraxia or anomia. Speech is clear with normal prosody and enunciation. Thought process is linear. Mood is normal and affect is normal.  Cranial nerves II - XII are as described above under HEENT exam. In addition: shoulder shrug is normal with equal shoulder height noted. Motor exam: Normal bulk, strength and tone is noted. There is no drift.  She has a mild bilateral upper extremity postural tremor and action tremor, no intention tremor, no resting tremor.  Reflexes are 2+ throughout. Babinski: Toes are flexor bilaterally. Fine motor skills and coordination: intact with normal finger taps, normal hand movements, normal rapid alternating patting, normal foot taps and normal foot agility.  Cerebellar testing: No dysmetria or  intention tremor on finger  to nose testing. Heel to shin is unremarkable bilaterally. There is no truncal or gait ataxia.  Sensory exam: intact to light touch in the upper and lower extremities.  Gait, station and balance: She stands easily. No veering to one side is noted. No leaning to one side is noted. Posture is age-appropriate and stance is slightly wider based, she walks slightly in securely, slightly slowly, has some trouble turning, no walking aid such as cane or walker.    Assessment and Plan:  In summary, Alexis [name] is a very pleasant 74 y.o.-year old female  with an underlying medical history of thyroid disease, PTSD, sleep apnea, hyperlipidemia, reflux disease, history of gastritis, diabetes, depression, asthma, arthritis, anemia, allergies, status post weight loss surgery, who presents for evaluation of her intermittent speech difficulty.  She has had this issue for the past several months.  She has on examination no slurring of speech as opposed to talking with her jaw almost closed her teeth clenched almost.  She has some increase in nuchal tone and has perhaps some decrease in facial expression, walks with a nonspecific change in her gait and balance, I wonder if her speech difficulty is secondary to being on Reglan for long-term.  She is encouraged to talk to you about perhaps reducing it or coming off of it.  She has no telltale signs of Parkinson's disease or focal neurologic signs to explain the speech impairment.nevertheless, I suggested we proceed with a brain MRI with and without contrast to rule out a structural cause of her speech difficulty and we will keep her posted by phone call.  So long as the MRI is nonrevealing I suggested as needed follow-up with me.  She is advised to stay well-hydrated with water and try to reduce her caffeine intake as she has a history of hand tremors and caffeine can exacerbate tremors.  I answered all her questions today and she was in agreement.     Thank you very much for allowing me to participate in the care of this nice patient. If I can be of any further assistance to you please do not hesitate to call me at [phone].  Sincerely,   Huston Foley, MD, PhD

## 2019-10-25 NOTE — Patient Instructions (Addendum)
You have had intermittent difficulty with clenching your jaw while talking for the past 1+ year as I understand.  Your neurological exam does not suggest any stroke or one-sided weakness.  You have been on medication for tremors.  I do not see an obvious reason for your speech impairment.  You have been on Reglan for many years, this can cause parkinsonism, muscle stiffness and may be in part the cause for your speech impairment.  Please talk to your prescribing physician about the need for ongoing use of Reglan on a regular basis.  As discussed, I will order a brain MRI with and without contrast to rule out a structural cause of your speech difficulty.  We will call you with the results.  So long as the MRI is nonrevealing and benign, we can have you follow-up with your primary care physician and as needed with me.

## 2019-10-25 NOTE — Telephone Encounter (Signed)
Medicare/cigna order sent to GI. No auth they will reach out to the patient to schedule.

## 2019-10-26 NOTE — Telephone Encounter (Signed)
Patient called in wanting her MRI at [place]. I messaged Caroline at [employer] [facility] she will reach out to the patient to schedule.

## 2019-10-26 NOTE — Telephone Encounter (Signed)
Patient I scheduled for 10/30/19.

## 2019-10-27 ENCOUNTER — Other Ambulatory Visit: Payer: Self-pay | Admitting: Family Medicine

## 2019-10-30 ENCOUNTER — Ambulatory Visit (HOSPITAL_BASED_OUTPATIENT_CLINIC_OR_DEPARTMENT_OTHER)
Admission: RE | Admit: 2019-10-30 | Discharge: 2019-10-30 | Disposition: A | Discharge status: Home / Self Care | Payer: Medicare Other | Source: Ambulatory Visit | Attending: Neurology | Admitting: Neurology

## 2019-10-30 ENCOUNTER — Other Ambulatory Visit: Payer: Self-pay

## 2019-10-30 DIAGNOSIS — G459 Transient cerebral ischemic attack, unspecified: Secondary | ICD-10-CM | POA: Diagnosis not present

## 2019-10-30 DIAGNOSIS — R479 Unspecified speech disturbances: Secondary | ICD-10-CM | POA: Diagnosis not present

## 2019-10-30 MED ORDER — GADOBUTROL 1 MMOL/ML IV SOLN
6.5000 mL | Freq: Once | INTRAVENOUS | Status: AC | PRN
  Administered 2019-10-30: 6.5 mL via INTRAVENOUS

## 2019-11-01 ENCOUNTER — Telehealth: Payer: Self-pay

## 2019-11-01 NOTE — Telephone Encounter (Signed)
Pt. Calling to know if she was still allergic to penicillins and after I asked the pt. If she had any kind of itchy rash or hives after  taking the amoxicillin challenge in the office and taking it at home for the next 3 days the pt. Verbally stated no, then I said you now can tell your physician you can take penicillins,amoxicillin and any cephalosporins without any problems per Dr. Beaulah Dinning. Told pt. All office notes are sent to the pcp so her pcp will be aware.

## 2019-11-01 NOTE — Progress Notes (Signed)
Please advise patient that her recent brain MRI did not show any acute findings.  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  This does not explain her speech difficulty.  For an old stroke we recommend that patients continue to pursue secondary prevention meaning blood pressure management, cholesterol management, diabetes prevention or management, healthy diet, regular exercise and good hydration with water. She should continue with her baby aspirin.  She is advised to follow-up with her primary care physician and her other specialists, including her vascular specialist.

## 2019-11-09 ENCOUNTER — Telehealth: Payer: Self-pay

## 2019-11-09 NOTE — Telephone Encounter (Signed)
I called pt and gave her MRI brain results. PT verbalized understanding. I stated per Dr Frances Furbish she is to follow up as needed. ALso to follow up with the her PCP and vascular MD.

## 2019-11-09 NOTE — Telephone Encounter (Signed)
-----   Message from Hildred Alamin, RN sent at 11/01/2019  4:57 PM EDT -----  ----- Message ----- From: Huston Foley, MD Sent: 11/01/2019   7:26 AM EDT To: Sunday Spillers, RN  Please advise patient that her recent brain MRI did not show any acute findings.  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  This does not explain her speech difficulty.  For an old stroke we recommend that patients continue to pursue secondary prevention meaning blood pressure management, cholesterol management, diabetes prevention or management, healthy diet, regular exercise and good hydration with water. She should continue with her baby aspirin.  She is advised to follow-up with her primary care physician and her other specialists, including her vascular specialist.

## 2019-11-30 DIAGNOSIS — E871 Hypo-osmolality and hyponatremia: Secondary | ICD-10-CM | POA: Diagnosis not present

## 2019-12-09 ENCOUNTER — Other Ambulatory Visit: Payer: Self-pay

## 2019-12-09 ENCOUNTER — Encounter (HOSPITAL_COMMUNITY): Payer: Self-pay | Admitting: Psychiatry

## 2019-12-09 ENCOUNTER — Telehealth (INDEPENDENT_AMBULATORY_CARE_PROVIDER_SITE_OTHER): Payer: Medicare Other | Admitting: Psychiatry

## 2019-12-09 DIAGNOSIS — F331 Major depressive disorder, recurrent, moderate: Secondary | ICD-10-CM

## 2019-12-09 DIAGNOSIS — F4310 Post-traumatic stress disorder, unspecified: Secondary | ICD-10-CM

## 2019-12-09 MED ORDER — VENLAFAXINE HCL ER 150 MG PO CP24: 150.0000 mg | ORAL_CAPSULE | Freq: Every day | ORAL | 0 refills | Status: DC

## 2019-12-09 MED ORDER — LAMOTRIGINE 100 MG PO TABS: 100.0000 mg | ORAL_TABLET | Freq: Every day | ORAL | 0 refills | Status: DC
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
## 2020-01-27 DIAGNOSIS — F4310 Post-traumatic stress disorder, unspecified: Secondary | ICD-10-CM | POA: Diagnosis not present

## 2020-01-27 DIAGNOSIS — E119 Type 2 diabetes mellitus without complications: Secondary | ICD-10-CM | POA: Diagnosis not present

## 2020-01-27 DIAGNOSIS — E871 Hypo-osmolality and hyponatremia: Secondary | ICD-10-CM | POA: Diagnosis not present

## 2020-01-27 DIAGNOSIS — F339 Major depressive disorder, recurrent, unspecified: Secondary | ICD-10-CM | POA: Diagnosis not present

## 2020-01-31 ENCOUNTER — Other Ambulatory Visit: Payer: Self-pay | Admitting: Family Medicine

## 2020-02-02 ENCOUNTER — Ambulatory Visit (INDEPENDENT_AMBULATORY_CARE_PROVIDER_SITE_OTHER): Payer: Medicare Other

## 2020-02-02 ENCOUNTER — Other Ambulatory Visit: Payer: Self-pay

## 2020-02-02 DIAGNOSIS — M810 Age-related osteoporosis without current pathological fracture: Secondary | ICD-10-CM | POA: Diagnosis not present

## 2020-02-02 MED ORDER — DENOSUMAB 60 MG/ML ~~LOC~~ SOSY
60.0000 mg | PREFILLED_SYRINGE | Freq: Once | SUBCUTANEOUS | Status: AC
  Administered 2020-02-02: 60 mg via SUBCUTANEOUS

## 2020-02-02 NOTE — Progress Notes (Signed)
Alexis [name] is a 74 y.o. female presents to the office today for Prolia injection, per physician's orders. Prolia 60mg  administered SQ in left arm.  Patient tolerated injection.  

## 2020-02-17 DIAGNOSIS — Z1151 Encounter for screening for human papillomavirus (HPV): Secondary | ICD-10-CM | POA: Diagnosis not present

## 2020-02-17 DIAGNOSIS — Z124 Encounter for screening for malignant neoplasm of cervix: Secondary | ICD-10-CM | POA: Diagnosis not present

## 2020-02-17 DIAGNOSIS — N763 Subacute and chronic vulvitis: Secondary | ICD-10-CM | POA: Diagnosis not present

## 2020-02-17 DIAGNOSIS — Z01419 Encounter for gynecological examination (general) (routine) without abnormal findings: Secondary | ICD-10-CM | POA: Diagnosis not present

## 2020-02-22 NOTE — Patient Instructions (Addendum)
It was great to see you again today, assuming all is well please see me in 6 months Please consider getting the shingles vaccine-Shingrix- at your pharmacy if not done already  I also recommend that you get a COVID-19 booster 6 to 8 months after you finished your primary series  Flu shot at the pharmacy I will be in touch with your labs, and will request your records from nephrology

## 2020-02-22 NOTE — Progress Notes (Addendum)
[facility] [address], [address] [place], [place] [address] [phone]  Date:  02/24/2020   Name:  Alexis [name]   DOB:  1946/03/24   MRN:  [id number]  PCP:  Pearline Cables, MD    Chief Complaint:  6 month follow up   History of Present Illness:  Alexis [name] is a 74 y.o. very pleasant female patient who presents with the following:  Patient today for 66-month follow-up visit-last seen by myself in March of this year History of diet-controlled diabetes, gastric bypass, carotid artery stenosis, osteoporosis, benign tremor, depression, foot drop, lichen simplex chronicus She tends to have hyponatremia, nephrology has felt most likely due to medications-  Her psychiatrist is Dr. Lolly [name]  She saw neurology, Dr. Vivi [name] thought perhaps her unusual speech (patient tends to talk with her teeth clenched) may be due to long-term use of Reglan.  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  She will keep me posted, will let me know if she decides to start taking it again.  She is taking 1,000 B12 and 1,000 D3 daily Urine microalbumin due- however pt notes she saw her nephrologist last month. I will request her records  Flu shot-patient is due this soon, we do not have the senior dose available yet A1c due- update today Eye exam next month- scheduled per pt  Mammo up-to-date Covid done Shingrix- pt reports done  Can update DEXA scan- ordered for her today   She continues to watch her weight carefully, exercises as much as her knee arthritis will allow Patient Active Problem List   Diagnosis Date Noted  . Osteoporosis 12/09/2017  . Syncope 07/17/2017  .  Shortness of breath 07/17/2017  . Hyponatremia 03/05/2017  . Lichen simplex chronicus 03/05/2017  . Type 2 diabetes mellitus without complication, without long-term current use of insulin (HCC) 03/05/2017  . Perianal lesion 01/31/2017  . Bilateral carotid artery stenosis 12/12/2016  . Restless legs 12/11/2016  . History of gastric bypass 12/11/2016  . PTSD (post-traumatic stress disorder) 12/11/2016  . Risk for falls 12/10/2016  . Other dysphagia 08/19/2016  . Right foot drop 08/19/2016  . Chronic vulvitis 12/25/2015  . Microcalcification of left breast on mammogram 05/11/2015  . Tension type headache 06/14/2013  . Essential tremor 04/13/2013  . Hypersomnia 04/13/2013    Past Medical History:  Diagnosis Date  . Allergy   . Anemia   . Arthritis   . Asthma   . Back pain   . Barrett's esophagus   . Bladder infection   . Cataract   . Depression   . Diabetes mellitus, type II (HCC)   . Gallstones   . Gastritis   . GERD (gastroesophageal reflux disease)   . Hyperlipidemia   . Memory deficits   . Osteoporosis 12/09/2017  . PTSD (post-traumatic stress disorder)   . Sleep apnea   . Thyroid disease    Patient denies    Past Surgical History:  Procedure Laterality Date  . CHOLECYSTECTOMY  1999  . COLONOSCOPY    . ESOPHAGOGASTRODUODENOSCOPY    . GANGLION CYST EXCISION Left   . GASTRIC BYPASS  2000  . TONSILLECTOMY AND ADENOIDECTOMY  age 17    Social History   Tobacco Use  . Smoking status: Never Smoker  . Smokeless tobacco: Never Used  Vaping Use  . Vaping Use: Never used  Substance Use Topics  . Alcohol use: No    Alcohol/week: 0.0 standard drinks  . Drug use: No    Family History  Problem Relation Age of Onset  . Depression Mother   . Heart disease Mother   . Kidney disease Mother   . Alcohol abuse Father   . Tremor Father   . Prostate cancer Father   . Other Father        Hypoglycemia  . Heart disease Father   . Asthma Father   . Diabetes  Paternal Grandmother   . Other Daughter        stillborn  . Colon cancer Neg Hx   . Esophageal cancer Neg Hx   . Rectal cancer Neg Hx   . Stomach cancer Neg Hx   . Allergic rhinitis Neg Hx   . Eczema Neg Hx   . Urticaria Neg Hx   . Immunodeficiency Neg Hx   . Angioedema Neg Hx     Allergies  Allergen Reactions  . Metformin Diarrhea  . Penicillins     Other reaction(s): HIVES    Medication list has been reviewed and updated.  Current Outpatient Medications on File Prior to Visit  Medication Sig Dispense Refill  . aspirin (ASPIRIN EC) 81 MG EC tablet Take 81 mg by mouth at bedtime. Swallow whole.    . b complex vitamins tablet Take 1 tablet by mouth 2 (two) times daily.    [name] BIOTIN 5000 PO Take 1 tablet by mouth 2 (two) times daily.    . calcium carbonate (OSCAL) 1500 (600 Ca) MG TABS tablet Take by mouth.    . Cholecalciferol (VITAMIN D3) 1.25 MG (50000 UT) CAPS Take 1 weekly for 12 weeks 12 capsule 4  . clindamycin (CLINDAGEL) 1 % gel Apply topically 2 (two) times daily. 30 g 0  . Cyanocobalamin (VITAMIN B12 TR PO) Take 500 mcg by mouth at bedtime.     . diphenhydrAMINE (BENADRYL) 25 mg capsule Take 25 mg by mouth at bedtime.    . fluticasone (FLONASE) 50 MCG/ACT nasal spray SPRAY 2 SPRAYS INTO EACH NOSTRIL EVERY DAY 48 mL 1  . gabapentin (NEURONTIN) 300 MG capsule TAKE 1 CAPSULE BY MOUTH EVERY DAY AT NIGHT 90 capsule 3  . glucose blood (ACCU-CHEK ACTIVE STRIPS) test strip Use as instructed- up to once a day.  Dx E11.9 100 each 12  . lamoTRIgine (LAMICTAL) 100 MG tablet Take 1 tablet (100 mg total) by mouth daily. 90 tablet 0  . loperamide (IMODIUM A-D) 2 MG tablet Take 2 mg by mouth as needed.     . meloxicam (MOBIC) 15 MG tablet Take 1 tablet (15 mg total) by mouth daily. 30 tablet 0  . metoCLOPramide (REGLAN) 10 MG tablet TAKE 1 TABLET BY MOUTH TWICE A DAY 180 tablet 1  . omeprazole (PRILOSEC) 40 MG capsule TAKE 1 CAPSULE BY MOUTH TWICE A DAY 180 capsule 3  . polyethylene  glycol powder (GLYCOLAX/MIRALAX) powder Take 1 Container by mouth once.    . primidone (MYSOLINE) 50 MG tablet Take 100 mg in the am, 50 at noon at 50 at supper 360 tablet 3  . triamcinolone (KENALOG) 0.025 % cream Apply to lip twice daily for 10 days. 30 g 0  . venlafaxine XR (EFFEXOR-XR) 150 MG 24 hr  capsule Take 1 capsule (150 mg total) by mouth daily. 90 capsule 0   Current Facility-Administered Medications on File Prior to Visit  Medication Dose Route Frequency Provider Last Rate Last Admin  . denosumab (PROLIA) injection 60 mg  60 mg Subcutaneous Once Macedonio Scallon, Gwenlyn Found, MD        Review of Systems:  As per HPI- otherwise negative.   Physical Examination: Vitals:   02/24/20 1021  BP: 108/60  Pulse: 68  Resp: 15  Temp: 98.3 F (36.8 C)  SpO2: 98%   Vitals:   02/24/20 1021  Weight: 146 lb 3.2 oz (66.3 kg)  Height: 5\' 4"  (1.626 m)   Body mass index is 25.1 kg/m. Ideal Body Weight: Weight in (lb) to have BMI = 25: 145.3  GEN: no acute distress.  Normal weight, looks well HEENT: Atraumatic, Normocephalic.  Ears and Nose: No external deformity. CV: RRR, No M/G/R. No JVD. No thrill. No extra heart sounds. PULM: CTA B, no wheezes, crackles, rhonchi. No retractions. No resp. distress. No accessory muscle use. ABD: S, NT, ND. No rebound. No HSM. EXTR: No c/c/e PSYCH: Normally interactive. Conversant.    Assessment and Plan: Type 2 diabetes mellitus without complication, without long-term current use of insulin (HCC) - Plan: Hemoglobin A1c  Penicillin allergy  Difficulty with speech  Age-related osteoporosis without current pathological fracture - Plan: DG Bone Density  H/O gastric bypass - Plan: Lipid panel, CBC, Vitamin B12, VITAMIN D 25 Hydroxy (Vit-D Deficiency, Fractures), Ferritin  Hyponatremia  Vitamin D deficiency - Plan: VITAMIN D 25 Hydroxy (Vit-D Deficiency, Fractures)  B12 deficiency - Plan: Vitamin B12  Estrogen deficiency  Screening, lipid -  Plan: Lipid panel  Mild anemia - Plan: CBC, Ferritin  Medication monitoring encounter - Plan: Lipid panel  Bilateral carotid artery stenosis - Plan: Lipid panel  Dyslipidemia - Plan: Lipid panel Patient here today for a follow-up visit We will check A1c, other routine labs as above Ordered bone density scan We discussed immunization boosters flu shot, she plans to do these later on this year [facility] for her most recent nephrology note  Plan to visit in 6 months This visit occurred during the SARS-CoV-2 public health emergency.  Safety protocols were in place, including screening questions prior to the visit, additional usage of staff PPE, and extensive cleaning of exam room while observing appropriate contact time as indicated for disinfecting solutions.    Signed [place], MD  Received her labs as below, 9/10 Letter to patient Results for orders placed or performed in visit on 02/24/20  Hemoglobin A1c  Result Value Ref Range   Hgb A1c MFr Bld 5.5 <5.7 % of total Hgb   Mean Plasma Glucose 111 (calc)   eAG (mmol/L) 6.2 (calc)  Lipid panel  Result Value Ref Range   Cholesterol 200 (H) <200 mg/dL   HDL 91 > OR = 50 mg/dL   Triglycerides 84 04/25/20 mg/dL   LDL Cholesterol (Calc) 92 mg/dL (calc)   Total CHOL/HDL Ratio 2.2 <5.0 (calc)   Non-HDL Cholesterol (Calc) 109 <130 mg/dL (calc)  CBC  Result Value Ref Range   WBC 5.5 3.8 - 10.8 Thousand/uL   RBC 3.89 3.80 - 5.10 Million/uL   Hemoglobin 12.5 11.7 - 15.5 g/dL   HCT <062 35 - 45 %   MCV 95.4 80.0 - 100.0 fL   MCH 32.1 27.0 - 33.0 pg   MCHC 33.7 32.0 - 36.0 g/dL   RDW  12.5 11.0 - 15.0 %   Platelets 206 140 - 400 Thousand/uL   MPV 10.7 7.5 - 12.5 fL  Vitamin B12  Result Value Ref Range   Vitamin B-12 970 200 - 1,100 pg/mL  VITAMIN D 25 Hydroxy (Vit-D Deficiency, Fractures)  Result Value Ref Range   Vit D, 25-Hydroxy 22 (L) 30 - 100 ng/mL  Ferritin  Result Value  Ref Range   Ferritin 6 (L) 16 - 288 ng/mL

## 2020-02-24 ENCOUNTER — Encounter: Payer: Self-pay | Admitting: Family Medicine

## 2020-02-24 ENCOUNTER — Ambulatory Visit (INDEPENDENT_AMBULATORY_CARE_PROVIDER_SITE_OTHER): Payer: Medicare Other | Admitting: Family Medicine

## 2020-02-24 ENCOUNTER — Other Ambulatory Visit: Payer: Self-pay

## 2020-02-24 VITALS — BP 108/60 | HR 68 | Temp 98.3°F | Resp 15 | Ht 64.0 in | Wt 146.2 lb

## 2020-02-24 DIAGNOSIS — R479 Unspecified speech disturbances: Secondary | ICD-10-CM

## 2020-02-24 DIAGNOSIS — Z9884 Bariatric surgery status: Secondary | ICD-10-CM | POA: Diagnosis not present

## 2020-02-24 DIAGNOSIS — I6523 Occlusion and stenosis of bilateral carotid arteries: Secondary | ICD-10-CM

## 2020-02-24 DIAGNOSIS — E871 Hypo-osmolality and hyponatremia: Secondary | ICD-10-CM

## 2020-02-24 DIAGNOSIS — Z5181 Encounter for therapeutic drug level monitoring: Secondary | ICD-10-CM

## 2020-02-24 DIAGNOSIS — E119 Type 2 diabetes mellitus without complications: Secondary | ICD-10-CM

## 2020-02-24 DIAGNOSIS — E785 Hyperlipidemia, unspecified: Secondary | ICD-10-CM

## 2020-02-24 DIAGNOSIS — E2839 Other primary ovarian failure: Secondary | ICD-10-CM | POA: Diagnosis not present

## 2020-02-24 DIAGNOSIS — D649 Anemia, unspecified: Secondary | ICD-10-CM

## 2020-02-24 DIAGNOSIS — E538 Deficiency of other specified B group vitamins: Secondary | ICD-10-CM | POA: Diagnosis not present

## 2020-02-24 DIAGNOSIS — Z13220 Encounter for screening for lipoid disorders: Secondary | ICD-10-CM

## 2020-02-24 DIAGNOSIS — Z880 Allergy status to penicillin: Secondary | ICD-10-CM

## 2020-02-24 DIAGNOSIS — M810 Age-related osteoporosis without current pathological fracture: Secondary | ICD-10-CM | POA: Diagnosis not present

## 2020-02-24 DIAGNOSIS — E559 Vitamin D deficiency, unspecified: Secondary | ICD-10-CM | POA: Diagnosis not present

## 2020-02-24 DIAGNOSIS — Z23 Encounter for immunization: Secondary | ICD-10-CM | POA: Diagnosis not present

## 2020-02-25 LAB — CBC
HCT: 37.1 % (ref 35.0–45.0)
Hemoglobin: 12.5 g/dL (ref 11.7–15.5)
MCH: 32.1 pg (ref 27.0–33.0)
MCHC: 33.7 g/dL (ref 32.0–36.0)
MCV: 95.4 fL (ref 80.0–100.0)
MPV: 10.7 fL (ref 7.5–12.5)
Platelets: 206 10*3/uL (ref 140–400)
RBC: 3.89 10*6/uL (ref 3.80–5.10)
RDW: 12.5 % (ref 11.0–15.0)
WBC: 5.5 10*3/uL (ref 3.8–10.8)

## 2020-02-25 LAB — FERRITIN: Ferritin: 6 ng/mL — ABNORMAL LOW (ref 16–288)

## 2020-02-25 LAB — HEMOGLOBIN A1C
Hgb A1c MFr Bld: 5.5 % of total Hgb (ref <5.7)
Mean Plasma Glucose: 111 (calc)
eAG (mmol/L): 6.2 (calc)

## 2020-02-25 LAB — LIPID PANEL
Cholesterol: 200 mg/dL — ABNORMAL HIGH (ref <200)
HDL: 91 mg/dL (ref >=50)
LDL Cholesterol (Calc): 92 mg/dL (calc)
Non-HDL Cholesterol (Calc): 109 mg/dL (calc) (ref <130)
Total CHOL/HDL Ratio: 2.2 (calc) (ref <5.0)
Triglycerides: 84 mg/dL (ref <150)

## 2020-02-25 LAB — VITAMIN D 25 HYDROXY (VIT D DEFICIENCY, FRACTURES): Vit D, 25-Hydroxy: 22 ng/mL — ABNORMAL LOW (ref 30–100)

## 2020-02-25 LAB — VITAMIN B12: Vitamin B-12: 970 pg/mL (ref 200–1100)

## 2020-02-28 ENCOUNTER — Other Ambulatory Visit: Payer: Self-pay

## 2020-02-28 ENCOUNTER — Ambulatory Visit (HOSPITAL_BASED_OUTPATIENT_CLINIC_OR_DEPARTMENT_OTHER)
Admission: RE | Admit: 2020-02-28 | Discharge: 2020-02-28 | Disposition: A | Discharge status: Home / Self Care | Payer: Medicare Other | Source: Ambulatory Visit | Attending: Family Medicine | Admitting: Family Medicine

## 2020-02-28 DIAGNOSIS — M810 Age-related osteoporosis without current pathological fracture: Secondary | ICD-10-CM | POA: Diagnosis not present

## 2020-02-28 DIAGNOSIS — Z780 Asymptomatic menopausal state: Secondary | ICD-10-CM | POA: Diagnosis not present

## 2020-03-03 ENCOUNTER — Encounter: Payer: Self-pay | Admitting: Family Medicine

## 2020-03-07 ENCOUNTER — Other Ambulatory Visit: Payer: Self-pay

## 2020-03-07 ENCOUNTER — Encounter (HOSPITAL_COMMUNITY): Payer: Self-pay | Admitting: Psychiatry

## 2020-03-07 ENCOUNTER — Telehealth (INDEPENDENT_AMBULATORY_CARE_PROVIDER_SITE_OTHER): Payer: Medicare Other | Admitting: Psychiatry

## 2020-03-07 DIAGNOSIS — F331 Major depressive disorder, recurrent, moderate: Secondary | ICD-10-CM

## 2020-03-07 DIAGNOSIS — I6523 Occlusion and stenosis of bilateral carotid arteries: Secondary | ICD-10-CM

## 2020-03-07 DIAGNOSIS — F4310 Post-traumatic stress disorder, unspecified: Secondary | ICD-10-CM

## 2020-03-07 MED ORDER — LAMOTRIGINE 100 MG PO TABS: 100.0000 mg | ORAL_TABLET | Freq: Every day | ORAL | 0 refills | Status: DC

## 2020-03-07 MED ORDER — VENLAFAXINE HCL ER 150 MG PO CP24: 150.0000 mg | ORAL_CAPSULE | Freq: Every day | ORAL | 0 refills | Status: DC
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
## 2020-03-10 ENCOUNTER — Telehealth (HOSPITAL_COMMUNITY): Payer: Medicare Other | Admitting: Psychiatry

## 2020-03-16 ENCOUNTER — Telehealth (HOSPITAL_COMMUNITY): Payer: Self-pay

## 2020-03-16 ENCOUNTER — Other Ambulatory Visit (HOSPITAL_COMMUNITY): Payer: Self-pay

## 2020-03-16 DIAGNOSIS — F331 Major depressive disorder, recurrent, moderate: Secondary | ICD-10-CM

## 2020-03-16 MED ORDER — LAMOTRIGINE 100 MG PO TABS: 100.0000 mg | ORAL_TABLET | Freq: Every day | ORAL | 0 refills | Status: DC

## 2020-03-16 NOTE — Telephone Encounter (Signed)
Patient called and stated that she couldn't get her Lamotrigine 100mg  and her Venlafaxine 150mg . I spoke with the pharmacy and they stated that they didn't get the Lamotrigine so I re-sent that one and patient's Venlafaxine isn't due to be filled until 03/26/20 Notified patient

## 2020-03-20 ENCOUNTER — Telehealth: Payer: Self-pay | Admitting: Family Medicine

## 2020-03-20 NOTE — Telephone Encounter (Signed)
Left message to return call. Patient is on Prolia already so im needing clarification as to why she is wanted to switch.

## 2020-03-20 NOTE — Telephone Encounter (Signed)
Patient states she would like to start on fosamax  [id number] - [place], [place] - [address] AT [address] Phone:  [phone]  Fax:  [phone]

## 2020-03-30 DIAGNOSIS — H40013 Open angle with borderline findings, low risk, bilateral: Secondary | ICD-10-CM | POA: Diagnosis not present

## 2020-03-30 DIAGNOSIS — H5201 Hypermetropia, right eye: Secondary | ICD-10-CM | POA: Diagnosis not present

## 2020-03-30 DIAGNOSIS — H52201 Unspecified astigmatism, right eye: Secondary | ICD-10-CM | POA: Diagnosis not present

## 2020-03-30 DIAGNOSIS — H04123 Dry eye syndrome of bilateral lacrimal glands: Secondary | ICD-10-CM | POA: Diagnosis not present

## 2020-03-30 DIAGNOSIS — Z961 Presence of intraocular lens: Secondary | ICD-10-CM | POA: Diagnosis not present

## 2020-03-30 DIAGNOSIS — E119 Type 2 diabetes mellitus without complications: Secondary | ICD-10-CM | POA: Diagnosis not present

## 2020-03-30 DIAGNOSIS — H524 Presbyopia: Secondary | ICD-10-CM | POA: Diagnosis not present

## 2020-03-30 DIAGNOSIS — H5000 Unspecified esotropia: Secondary | ICD-10-CM | POA: Diagnosis not present

## 2020-03-30 DIAGNOSIS — H43813 Vitreous degeneration, bilateral: Secondary | ICD-10-CM | POA: Diagnosis not present

## 2020-04-14 ENCOUNTER — Telehealth: Payer: Self-pay

## 2020-04-14 NOTE — Telephone Encounter (Signed)
Nurse Assessment Nurse: Alvester Morin RN, Marcelino Duster Date/Time (Eastern Time): 04/14/2020 8:39:43 AM Confirm and document reason for call. If symptomatic, describe symptoms. ---Caller has red left eye. Symptoms started this morning. Eye feels dry. No drainage. No fever. Does the patient have any new or worsening symptoms? ---Yes Will a triage be completed? ---Yes Related visit to physician within the last 2 weeks? ---No Does the PT have any chronic conditions? (i.e. diabetes, asthma, this includes High risk factors for pregnancy, etc.) ---Yes List chronic conditions. ---Diabetic Is this a behavioral health or substance abuse call? ---No Guidelines Guideline Title Affirmed Question Affirmed Notes Nurse Date/Time (Eastern Time) Eye - Red Without Pus [1] Red eye caused by sunscreen, smoke, smog, chlorine, food, soap or other mild irritant AND [2] no blurred vision Bell, RN, Marcelino Duster 04/14/2020 8:40:58 AM Disp. Time [name] Time) Disposition Final User 04/14/2020 8:29:05 AM Attempt made - message left Alvester Morin, RN, Marcelino Duster 04/14/2020 8:44:17 AM Home Care Yes Alvester Morin, RN, Lavon Paganini Disagree/Comply Comply PLEASE NOTE: All timestamps contained within this report are represented as [place] Standard Time. CONFIDENTIALTY NOTICE: This fax transmission is intended only for the addressee. It contains information that is legally privileged, confidential or otherwise protected from use or disclosure. If you are not the intended recipient, you are strictly prohibited from reviewing, disclosing, copying using or disseminating any of this information or taking any action in reliance on or regarding this information. If you have received this fax in error, please notify us immediately by telephone so that we can arrange for its return to [place]. Phone: [phone], Toll-Free: [phone], Fax: [phone] Page: 2 of 2 Call Id: [id number] Caller Understands Yes PreDisposition Home Care Care Advice Given Per  Guideline HOME CARE: * You should be able to treat this at home. REASSURANCE AND EDUCATION - MILD EYE IRRITANTS: * Most eye irritants (e.g., sunscreen, smoke, smog, chlorine, perfume, food, soap) cause brief redness of the eyes. * We can treat this at home. * Here is some care advice that should help. * Reason: This will help remove any irritating substance from your eyes. * Gently rinse (irrigate) each eye with lukewarm water for 2 to 3 minutes. WASH IRRITANT OUT OF EYES: VASOCONSTRICTOR EYE DROPS: * Red eyes from irritants usually feel much better after the irritant has been washed out. * Use 1 to 2 drops. May repeat ONCE in 8-12 hours. * If they remain uncomfortable and bloodshot, you can use some over-the-counter vasoconstrictor eye drops (e.g., Visine). CALL BACK IF: * Yellow or green pus (discharge) from eye occurs * Blurred vision occurs * Redness lasts over 7 days * You become worse CARE ADVICE given per Eye - Red without Pus (Adult) guideline

## 2020-04-22 ENCOUNTER — Other Ambulatory Visit: Payer: Self-pay | Admitting: Family Medicine

## 2020-04-22 DIAGNOSIS — G250 Essential tremor: Secondary | ICD-10-CM

## 2020-05-03 DIAGNOSIS — Z23 Encounter for immunization: Secondary | ICD-10-CM | POA: Diagnosis not present

## 2020-05-10 ENCOUNTER — Telehealth: Payer: Self-pay | Admitting: Family Medicine

## 2020-05-10 NOTE — Telephone Encounter (Signed)
Called pt- I received paperwork for a commercial service to perform "cardiovascular genetic testing" for patient.  I called patient, she notes that she was solicited on the telephone by this company.  I advised her that this was likely a scam and is not recommended, she does not wish to move forward testing

## 2020-05-25 ENCOUNTER — Other Ambulatory Visit: Payer: Self-pay | Admitting: Family Medicine

## 2020-05-25 DIAGNOSIS — G250 Essential tremor: Secondary | ICD-10-CM

## 2020-05-30 ENCOUNTER — Encounter (HOSPITAL_COMMUNITY): Payer: Self-pay | Admitting: Psychiatry

## 2020-05-30 ENCOUNTER — Other Ambulatory Visit: Payer: Self-pay

## 2020-05-30 ENCOUNTER — Telehealth (INDEPENDENT_AMBULATORY_CARE_PROVIDER_SITE_OTHER): Payer: Medicare Other | Admitting: Psychiatry

## 2020-05-30 DIAGNOSIS — F4310 Post-traumatic stress disorder, unspecified: Secondary | ICD-10-CM | POA: Diagnosis not present

## 2020-05-30 DIAGNOSIS — F331 Major depressive disorder, recurrent, moderate: Secondary | ICD-10-CM | POA: Diagnosis not present

## 2020-05-30 MED ORDER — LAMOTRIGINE 100 MG PO TABS: 100.0000 mg | ORAL_TABLET | Freq: Every day | ORAL | 0 refills | Status: DC

## 2020-05-30 MED ORDER — VENLAFAXINE HCL ER 150 MG PO CP24: 150.0000 mg | ORAL_CAPSULE | Freq: Every day | ORAL | 0 refills | Status: DC
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
## 2020-06-21 ENCOUNTER — Telehealth: Payer: Self-pay | Admitting: Family Medicine

## 2020-06-21 NOTE — Telephone Encounter (Signed)
Ok to write letter stating patient's weight? Will mail to patient.

## 2020-06-21 NOTE — Telephone Encounter (Signed)
Patient states she need a note on (prescription pad) for TOTS stating her weight is now 145 lbs. Patient would like the note to be mail to her.

## 2020-06-21 NOTE — Telephone Encounter (Signed)
Yes- letter is written.  I called pt and let her know we will mail it

## 2020-06-22 NOTE — Telephone Encounter (Signed)
Letter mailed to patient.

## 2020-07-18 DIAGNOSIS — I6523 Occlusion and stenosis of bilateral carotid arteries: Secondary | ICD-10-CM | POA: Diagnosis not present

## 2020-07-18 DIAGNOSIS — E119 Type 2 diabetes mellitus without complications: Secondary | ICD-10-CM | POA: Diagnosis not present

## 2020-07-24 IMAGING — MR MR HEAD WO/W CM
10 series · 48 of 48 positions shown · IV contrast (gadavist)
Comparison: None.

CLINICAL DATA: Transient ischemic attack

EXAM:
MRI HEAD WITHOUT AND WITH CONTRAST
TECHNIQUE: Multiplanar, multiecho pulse sequences of the brain and surrounding
structures were obtained without and with intravenous contrast.
CONTRAST:  6.5mL GADAVIST GADOBUTROL 1 MMOL/ML IV SOLN

[Series 2: T1 · sagittal · 5.0mm · 0.90mm/px · 3 of 27 slices shown (1 of 2)]
[im 1/27]
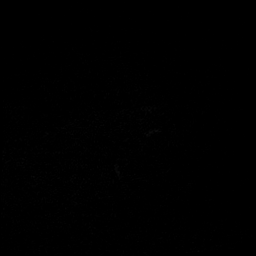
[im 14/27]
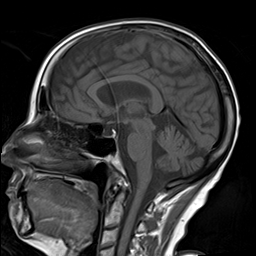
[im 27/27]
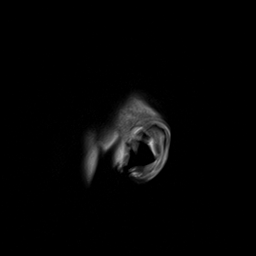

[Series 3: DWI · axial · 3.0mm · 1.88mm/px · z∈[-14,+137]mm · 9 of 95 slices shown (1 of 2)]
[im 1/95]
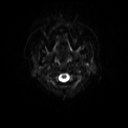
[im 12/95]
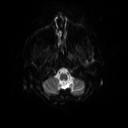
[im 24/95]
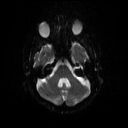
[im 36/95]
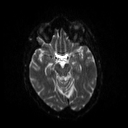
[im 48/95]
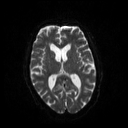
[im 59/95]
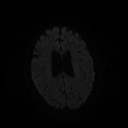
[im 71/95]
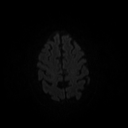
[im 83/95]
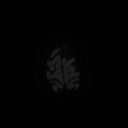
[im 95/95]
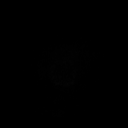

[Series 4: DWI · axial · 3.0mm · 1.88mm/px · z∈[-14,+137]mm · 4 of 48 slices shown (2 of 2)]
[im 1/48]
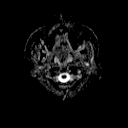
[im 16/48]
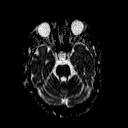
[im 32/48]
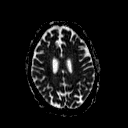
[im 48/48]
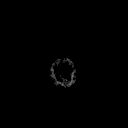

[Series 5: T2 · axial · 5.0mm · 0.66mm/px · z∈[-16,+130]mm · 2 of 26 slices shown (1 of 3)]
[im 1/26]
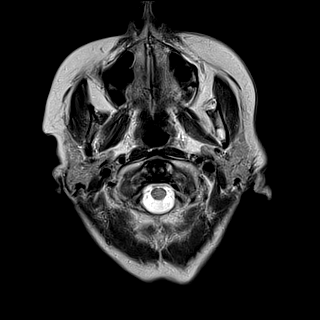
[im 26/26]
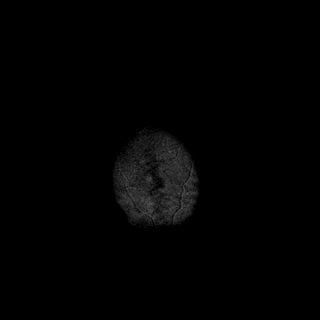

[Series 6: T2 · axial · 5.0mm · 0.41mm/px · z∈[-16,+130]mm · 2 of 26 slices shown (2 of 3)]
[im 1/26]
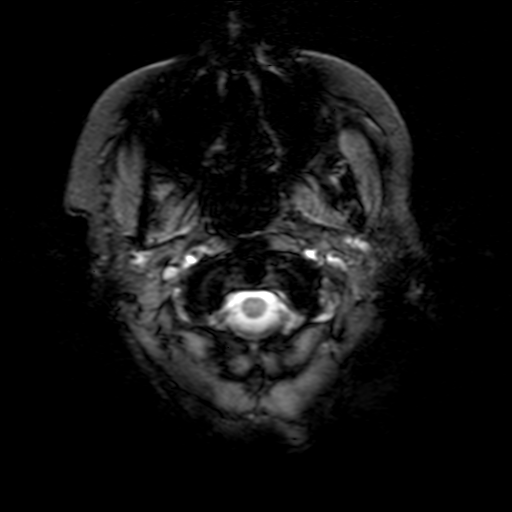
[im 26/26]
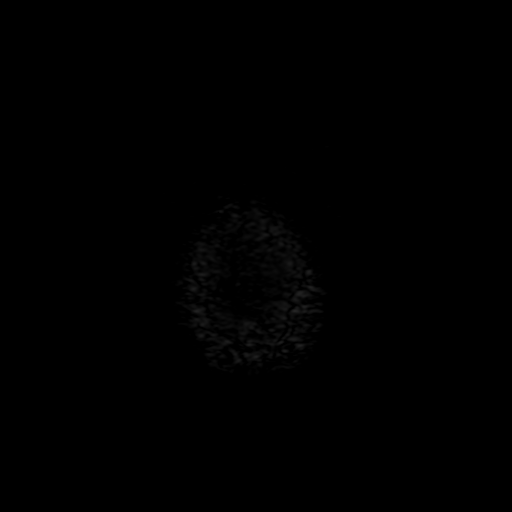

[Series 7: FLAIR · axial · 3.0mm · 0.41mm/px · z∈[-22,+137]mm · 4 of 42 slices shown]
[im 1/42]
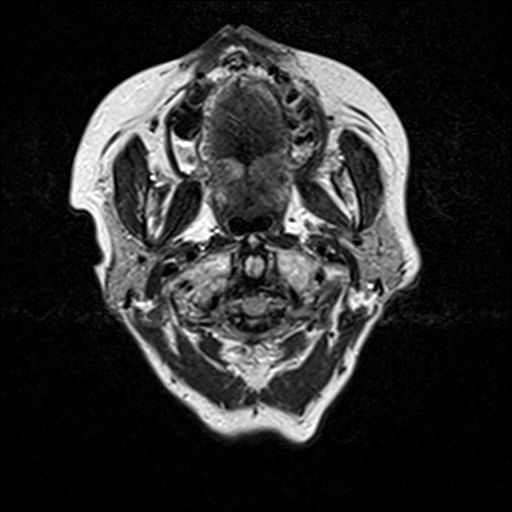
[im 14/42]
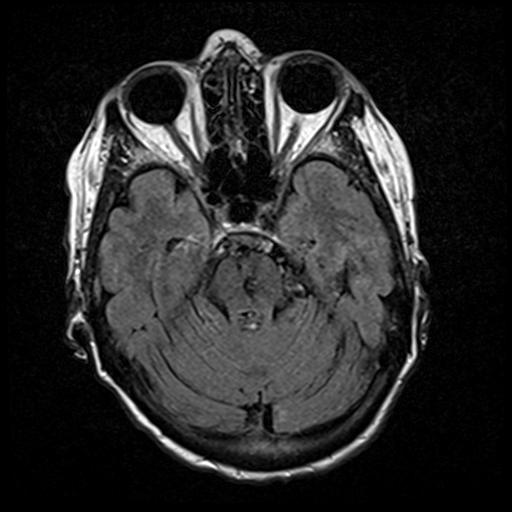
[im 28/42]
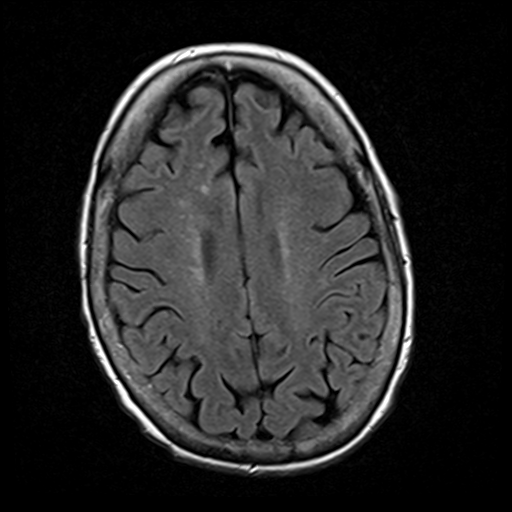
[im 42/42]
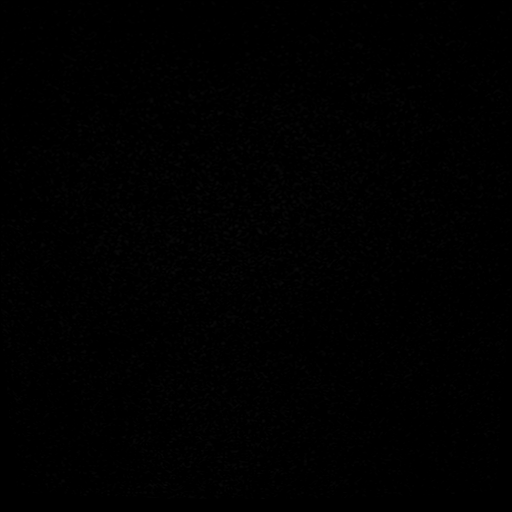

[Series 8: t1_3d_tra · axial · 2.0mm · 0.90mm/px · z∈[-32,+153]mm · 9 of 96 slices shown]
[im 1/96]
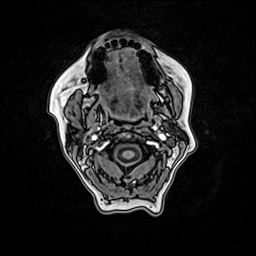
[im 12/96]
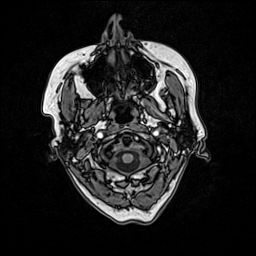
[im 24/96]
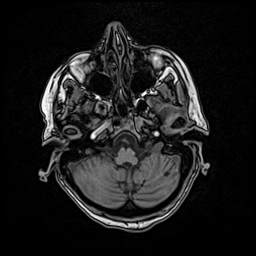
[im 36/96]
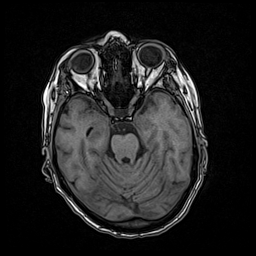
[im 48/96]
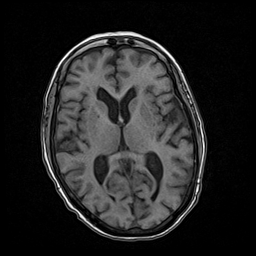
[im 60/96]
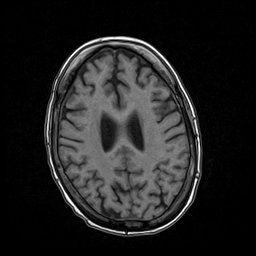
[im 72/96]
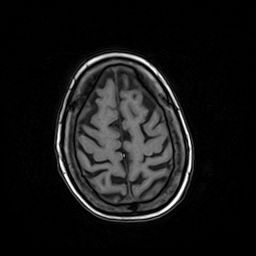
[im 84/96]
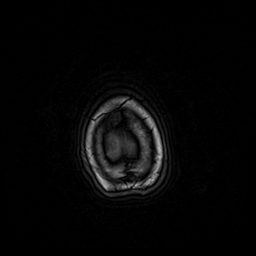
[im 96/96]
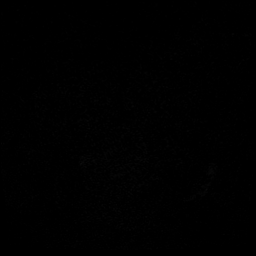

[Series 9: T2 · coronal · 5.0mm · 0.66mm/px · 3 of 30 slices shown (3 of 3)]
[im 1/30]
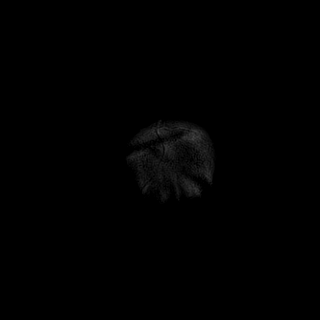
[im 15/30]
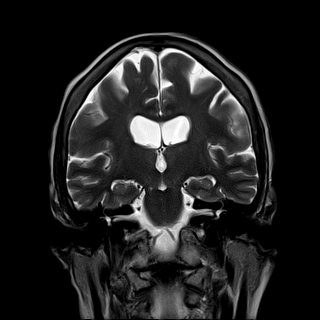
[im 30/30]
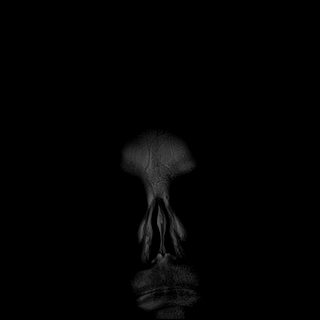

[Series 10: t1_3d_tra +c · axial · 2.0mm · 0.90mm/px · z∈[-32,+153]mm · 9 of 96 slices shown]
[im 1/96]
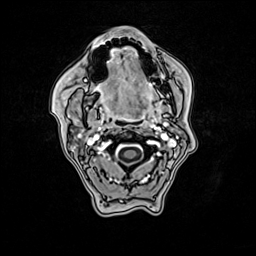
[im 12/96]
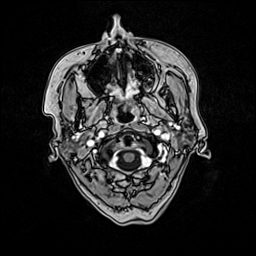
[im 24/96]
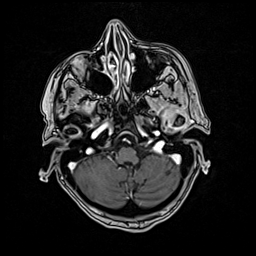
[im 36/96]
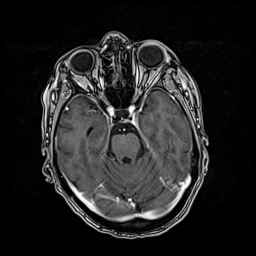
[im 48/96]
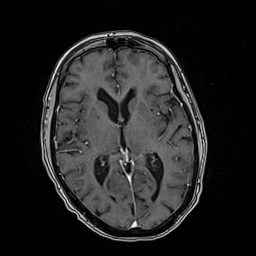
[im 60/96]
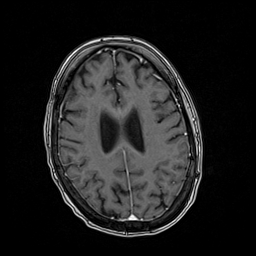
[im 72/96]
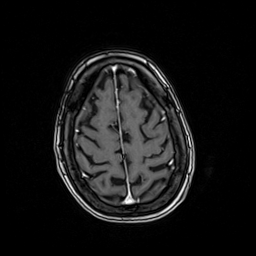
[im 84/96]
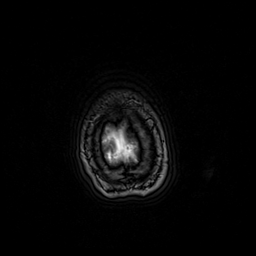
[im 96/96]
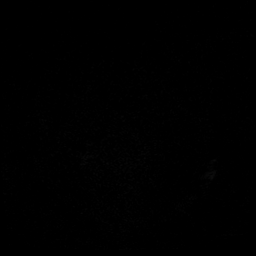

[Series 11: T1 · coronal · 5.0mm · 0.82mm/px · 3 of 30 slices shown (2 of 2)]
[im 1/30]
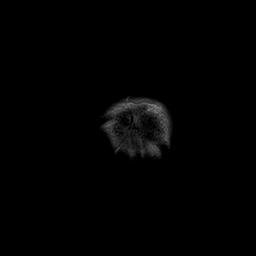
[im 15/30]
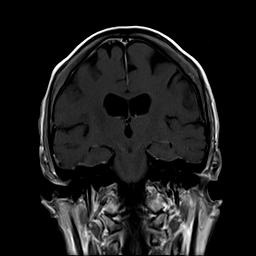
[im 30/30]
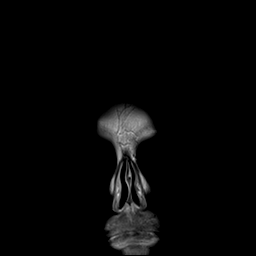

[48 of 48 positions shown; findings below may reference images not displayed]

FINDINGS: BRAIN: No acute infarct, acute hemorrhage or extra-axial collection.
Multifocal white matter hyperintensity, most commonly due to chronic
ischemic microangiopathy. There is an old left cerebellar infarct.
No chronic microhemorrhage. Normal midline structures. There is no
abnormal contrast enhancement.

VASCULAR: Major flow voids are preserved.

SKULL AND UPPER CERVICAL SPINE: Normal calvarium and skull base.
Visualized upper cervical spine and soft tissues are normal.

SINUSES/ORBITS: No paranasal sinus fluid levels or advanced mucosal
thickening. No mastoid or middle ear effusion. Normal orbits.
IMPRESSION: 1. No acute intracranial abnormality.
2. Old left cerebellar infarct and findings of chronic small vessel
disease.

## 2020-07-25 DIAGNOSIS — E871 Hypo-osmolality and hyponatremia: Secondary | ICD-10-CM | POA: Diagnosis not present

## 2020-07-27 ENCOUNTER — Encounter: Payer: Self-pay | Admitting: Family Medicine

## 2020-07-27 DIAGNOSIS — Z1231 Encounter for screening mammogram for malignant neoplasm of breast: Secondary | ICD-10-CM | POA: Diagnosis not present

## 2020-07-27 LAB — HM MAMMOGRAPHY

## 2020-08-08 ENCOUNTER — Other Ambulatory Visit: Payer: Self-pay

## 2020-08-08 ENCOUNTER — Ambulatory Visit (INDEPENDENT_AMBULATORY_CARE_PROVIDER_SITE_OTHER): Payer: Medicare Other

## 2020-08-08 DIAGNOSIS — M810 Age-related osteoporosis without current pathological fracture: Secondary | ICD-10-CM | POA: Diagnosis not present

## 2020-08-08 MED ORDER — DENOSUMAB 60 MG/ML ~~LOC~~ SOSY
60.0000 mg | PREFILLED_SYRINGE | Freq: Once | SUBCUTANEOUS | Status: AC
  Administered 2020-08-08: 60 mg via SUBCUTANEOUS

## 2020-08-08 NOTE — Progress Notes (Signed)
Patient here today for prolia shot. 28mL given in left arm SQ. Patient tolerated well. Next injection due 02/06/2021 or after.   Routed to DOD in absence of PCP

## 2020-08-18 ENCOUNTER — Other Ambulatory Visit (HOSPITAL_COMMUNITY): Payer: Self-pay | Admitting: Psychiatry

## 2020-08-18 DIAGNOSIS — F331 Major depressive disorder, recurrent, moderate: Secondary | ICD-10-CM

## 2020-08-20 NOTE — Progress Notes (Addendum)
[facility] [address], [address] [place], [place] [address] [phone]  Date:  08/23/2020   Name:  Alexis [name]   DOB:  September 03, 1945   MRN:  [id number]  PCP:  Pearline Cables, MD    Chief Complaint: Diabetes (6 month follow up/) and Arm Pain (Left arm, limited ROM)   History of Present Illness:  Alexis [name] is a 75 y.o. very pleasant female patient who presents with the following:  Here today for a 6 month follow-up  Last seen by myself in September History of diet-controlled diabetes, gastric bypass, carotid artery stenosis, osteoporosis, benign tremor, depression, foot drop, lichen simplex chronicus  She tends to have hyponatremia, nephrology has felt most likely due to medications.  Pt notes she does not like the area her nephrology office is located in, would like to change to a provider in [place]  I will place referral as such Her psychiatrist is Dr.Arfeen-her next visit is tomorrow Her neurologist had noted her unusual speech- she talks with her teeth clenched.  The exact cause is unclear, however this is thought to be benign  She also does have a hand tremor, she is taking primidone for this She continues to have some symptoms of tremor which can be annoying  Flu vaccine- done  COVID series complete  Pt notes she has had left shoulder pain for about 3 months, not aware of any injury It is tender to touch and hurts when she tries to dress/ put on her bra It hurts is she moves her arm in a certain way Does not hurt all the time  No chest pain or shortness of breath  She notes that she has lost a few pounds recently, she is not sure why as she continues to eat like she normally does Wt Readings from Last 3 Encounters:  08/23/20 139 lb (63 kg)  02/24/20 146 lb 3.2 oz (66.3 kg)  10/25/19 145 lb 5 oz (65.9 kg)   colonoscopy 2019  Lab Results  Component Value Date   HGBA1C 5.5 02/24/2020    Seen just recently  by [facility] to follow-up her carotid artery disease  Visit with psychiatry in December  Patient Active Problem List   Diagnosis Date Noted  . Osteoporosis 12/09/2017  . Syncope 07/17/2017  . Shortness of breath 07/17/2017  . Hyponatremia 03/05/2017  . Lichen simplex chronicus 03/05/2017  . Type 2 diabetes mellitus without complication, without long-term current use of insulin (HCC) 03/05/2017  . Perianal lesion 01/31/2017  . Bilateral carotid artery stenosis 12/12/2016  . Restless legs 12/11/2016  . History of gastric bypass 12/11/2016  . PTSD (post-traumatic stress disorder) 12/11/2016  . Risk for falls 12/10/2016  . Other dysphagia 08/19/2016  . Right foot drop 08/19/2016  . Chronic vulvitis 12/25/2015  . Microcalcification of left breast on mammogram 05/11/2015  . Tension type headache 06/14/2013  . Essential tremor 04/13/2013  . Hypersomnia 04/13/2013    Past Medical History:  Diagnosis Date  . Allergy   . Anemia   . Arthritis   . Asthma   . Back pain   . Barrett's esophagus   . Bladder infection   . Cataract   . Depression   . Diabetes mellitus, type II (HCC)   . Gallstones   . Gastritis   . GERD (gastroesophageal reflux disease)   . Hyperlipidemia   . Memory deficits   . Osteoporosis 12/09/2017  . PTSD (post-traumatic stress disorder)   .  Sleep apnea   . Thyroid disease    Patient denies    Past Surgical History:  Procedure Laterality Date  . CHOLECYSTECTOMY  1999  . COLONOSCOPY    . ESOPHAGOGASTRODUODENOSCOPY    . GANGLION CYST EXCISION Left   . GASTRIC BYPASS  2000  . TONSILLECTOMY AND ADENOIDECTOMY     age 29    Social History   Tobacco Use  . Smoking status: Never Smoker  . Smokeless tobacco: Never Used  Vaping Use  . Vaping Use: Never used  Substance Use Topics  . Alcohol use: No    Alcohol/week: 0.0 standard drinks  . Drug use: No    Family History  Problem Relation Age of Onset  . Depression Mother   . Heart disease Mother   . Kidney  disease Mother   . Alcohol abuse Father   . Tremor Father   . Prostate cancer Father   . Other Father        Hypoglycemia  . Heart disease Father   . Asthma Father   . Diabetes Paternal Grandmother   . Other Daughter        stillborn  . Colon cancer Neg Hx   . Esophageal cancer Neg Hx   . Rectal cancer Neg Hx   . Stomach cancer Neg Hx   . Allergic rhinitis Neg Hx   . Eczema Neg Hx   . Urticaria Neg Hx   . Immunodeficiency Neg Hx   . Angioedema Neg Hx     Allergies  Allergen Reactions  . Metformin Diarrhea    Medication list has been reviewed and updated.  Current Outpatient Medications on File Prior to Visit  Medication Sig Dispense Refill  . aspirin 81 MG EC tablet Take 81 mg by mouth at bedtime. Swallow whole.    . b complex vitamins tablet Take 1 tablet by mouth 2 (two) times daily.    [name] BIOTIN 5000 PO Take 1 tablet by mouth 2 (two) times daily.    . calcium carbonate (OSCAL) 1500 (600 Ca) MG TABS tablet Take by mouth.    . Cholecalciferol (VITAMIN D3) 1.25 MG (50000 UT) CAPS Take 1 weekly for 12 weeks 12 capsule 4  . clindamycin (CLINDAGEL) 1 % gel Apply topically 2 (two) times daily. 30 g 0  . Cyanocobalamin (VITAMIN B12 TR PO) Take 500 mcg by mouth at bedtime.     . diphenhydrAMINE (BENADRYL) 25 mg capsule Take 25 mg by mouth at bedtime.    . fluticasone (FLONASE) 50 MCG/ACT nasal spray SPRAY 2 SPRAYS INTO EACH NOSTRIL EVERY DAY 48 mL 1  . gabapentin (NEURONTIN) 300 MG capsule TAKE 1 CAPSULE BY MOUTH EVERY NIGHT 90 capsule 3  . glucose blood (ACCU-CHEK ACTIVE STRIPS) test strip Use as instructed- up to once a day.  Dx E11.9 100 each 12  . lamoTRIgine (LAMICTAL) 100 MG tablet Take 1 tablet (100 mg total) by mouth daily. 90 tablet 0  . loperamide (IMODIUM A-D) 2 MG tablet Take 2 mg by mouth as needed.     . meloxicam (MOBIC) 15 MG tablet Take 1 tablet (15 mg total) by mouth daily. 30 tablet 0  . omeprazole (PRILOSEC) 40 MG capsule TAKE 1 CAPSULE BY MOUTH TWICE A DAY  180 capsule 3  . polyethylene glycol powder (GLYCOLAX/MIRALAX) powder Take 1 Container by mouth once.    . primidone (MYSOLINE) 50 MG tablet TAKE 2 TABLETS EVERY MORNING TAKE 1 TABLET AT NOON AND TAKE 1 TABLET AT SUPPER  360 tablet 3  . triamcinolone (KENALOG) 0.025 % cream Apply to lip twice daily for 10 days. 30 g 0  . venlafaxine XR (EFFEXOR-XR) 150 MG 24 hr capsule Take 1 capsule (150 mg total) by mouth daily. 90 capsule 0   Current Facility-Administered Medications on File Prior to Visit  Medication Dose Route Frequency Provider Last Rate Last Admin  . denosumab (PROLIA) injection 60 mg  60 mg Subcutaneous Once Enrigue Hashimi, [name], MD        Review of Systems:  As per HPI- otherwise negative.   Physical Examination: Vitals:   08/23/20 1033  BP: 126/64  Pulse: 84  Resp: 16  Temp: 97.8 F (36.6 C)  SpO2: 94%   Vitals:   08/23/20 1033  Weight: 139 lb (63 kg)  Height: 5\' 4"  (1.626 m)   Body mass index is 23.86 kg/m. Ideal Body Weight: Weight in (lb) to have BMI = 25: 145.3  GEN: no acute distress.  Looks well and her normal self HEENT: Atraumatic, Normocephalic.  Ears and Nose: No external deformity. CV: RRR, No M/G/R. No JVD. No thrill. No extra heart sounds. PULM: CTA B, no wheezes, crackles, rhonchi. No retractions. No resp. distress. No accessory muscle use. ABD: S, NT, ND, +BS. No rebound. No HSM. EXTR: No c/c/e PSYCH: Normally interactive. Conversant.  Tremor bilateral hands, more pronounced on the right Left shoulder shows decreased range of motion to internal and external rotation.  She is tender over the left rotator cuff tendon insertion  Assessment and Plan: Screening for thyroid disorder - Plan: TSH  Type 2 diabetes mellitus without complication, without long-term current use of insulin (HCC) - Plan: Hemoglobin A1c  H/O gastric bypass - Plan: VITAMIN D 25 Hydroxy (Vit-D Deficiency, Fractures), CBC, Comprehensive metabolic panel  Hyponatremia - Plan:  Comprehensive metabolic panel, Ambulatory referral to Nephrology  Vitamin D deficiency - Plan: VITAMIN D 25 Hydroxy (Vit-D Deficiency, Fractures)  Rotator cuff tendonitis, left - Plan: Ambulatory referral to Physical Therapy  History of anemia - Plan: CBC, Ferritin  Following up today She has lost a few pounds, will check her thyroid today.  Encouraged her to increase her caloric intake by about 200/day and see if she can put her weight back on.  She does attend a weight maintenance program called [facility] Follow-up on labs as above  Pt referral for shoulder pain  Discussed her tremor, offered to increase her primidone medication.  For the time being she declines, will continue to observe This visit occurred during the SARS-CoV-2 public health emergency.  Safety protocols were in place, including screening questions prior to the visit, additional usage of staff PPE, and extensive cleaning of exam room while observing appropriate contact time as indicated for disinfecting solutions.    Signed Abbe AmsterdamJessica Blake Vetrano, MD   Received her labs as below, letter to patient  Results for orders placed or performed in visit on 08/23/20  Hemoglobin A1c  Result Value Ref Range   Hgb A1c MFr Bld 5.6 4.6 - 6.5 %  VITAMIN D 25 Hydroxy (Vit-D Deficiency, Fractures)  Result Value Ref Range   VITD 43.75 30.00 - 100.00 ng/mL  CBC  Result Value Ref Range   WBC 5.7 4.0 - 10.5 K/uL   RBC 4.09 3.87 - 5.11 Mil/uL   Platelets 201.0 150.0 - 400.0 K/uL   Hemoglobin 13.2 12.0 - 15.0 g/dL   HCT 16.139.0 09.636.0 - 04.546.0 %   MCV 95.3 78.0 - 100.0 fl   MCHC 33.7 30.0 -  36.0 g/dL   RDW 33.8 25.0 - 53.9 %  Comprehensive metabolic panel  Result Value Ref Range   Sodium 135 135 - 145 mEq/L   Potassium 5.2 No hemolysis seen (H) 3.5 - 5.1 mEq/L   Chloride 99 96 - 112 mEq/L   CO2 28 19 - 32 mEq/L   Glucose, Bld 119 (H) 70 - 99 mg/dL   BUN 20 6 - 23 mg/dL   Creatinine, Ser 7.67 0.40 - 1.20 mg/dL   Total Bilirubin 0.5 0.2 - 1.2  mg/dL   Alkaline Phosphatase 59 39 - 117 U/L   AST 21 0 - 37 U/L   ALT 20 0 - 35 U/L   Total Protein 6.6 6.0 - 8.3 g/dL   Albumin 4.3 3.5 - 5.2 g/dL   GFR 34.19 >37.90 mL/min   Calcium 9.2 8.4 - 10.5 mg/dL  TSH  Result Value Ref Range   TSH 1.59 0.35 - 4.50 uIU/mL  Ferritin  Result Value Ref Range   Ferritin 16.2 10.0 - 291.0 ng/mL

## 2020-08-20 NOTE — Patient Instructions (Addendum)
Great to see you again today- please see me in about 6 months assuming all is well We will set you up for physical therapy at the office right across from our lobby- they will help you with tendonitis of your shoulder I will be in touch with your labs asap  Let me know if anything is changing or getting worse in the meantime

## 2020-08-23 ENCOUNTER — Other Ambulatory Visit: Payer: Self-pay

## 2020-08-23 ENCOUNTER — Ambulatory Visit (INDEPENDENT_AMBULATORY_CARE_PROVIDER_SITE_OTHER): Payer: Medicare Other | Admitting: Family Medicine

## 2020-08-23 VITALS — BP 126/64 | HR 84 | Temp 97.8°F | Resp 16 | Ht 64.0 in | Wt 139.0 lb

## 2020-08-23 DIAGNOSIS — Z9884 Bariatric surgery status: Secondary | ICD-10-CM | POA: Diagnosis not present

## 2020-08-23 DIAGNOSIS — Z1329 Encounter for screening for other suspected endocrine disorder: Secondary | ICD-10-CM | POA: Diagnosis not present

## 2020-08-23 DIAGNOSIS — Z862 Personal history of diseases of the blood and blood-forming organs and certain disorders involving the immune mechanism: Secondary | ICD-10-CM | POA: Diagnosis not present

## 2020-08-23 DIAGNOSIS — E559 Vitamin D deficiency, unspecified: Secondary | ICD-10-CM

## 2020-08-23 DIAGNOSIS — E119 Type 2 diabetes mellitus without complications: Secondary | ICD-10-CM | POA: Diagnosis not present

## 2020-08-23 DIAGNOSIS — M7582 Other shoulder lesions, left shoulder: Secondary | ICD-10-CM | POA: Diagnosis not present

## 2020-08-23 DIAGNOSIS — E871 Hypo-osmolality and hyponatremia: Secondary | ICD-10-CM | POA: Diagnosis not present

## 2020-08-23 LAB — CBC
HCT: 39 % (ref 36.0–46.0)
Hemoglobin: 13.2 g/dL (ref 12.0–15.0)
MCHC: 33.7 g/dL (ref 30.0–36.0)
MCV: 95.3 fl (ref 78.0–100.0)
Platelets: 201 10*3/uL (ref 150.0–400.0)
RBC: 4.09 Mil/uL (ref 3.87–5.11)
RDW: 13.2 % (ref 11.5–15.5)
WBC: 5.7 10*3/uL (ref 4.0–10.5)

## 2020-08-23 LAB — COMPREHENSIVE METABOLIC PANEL
ALT: 20 U/L (ref 0–35)
AST: 21 U/L (ref 0–37)
Albumin: 4.3 g/dL (ref 3.5–5.2)
Alkaline Phosphatase: 59 U/L (ref 39–117)
BUN: 20 mg/dL (ref 6–23)
CO2: 28 mEq/L (ref 19–32)
Calcium: 9.2 mg/dL (ref 8.4–10.5)
Chloride: 99 mEq/L (ref 96–112)
Creatinine, Ser: 0.83 mg/dL (ref 0.40–1.20)
GFR: 69.14 mL/min (ref >60.00)
Glucose, Bld: 119 mg/dL — ABNORMAL HIGH (ref 70–99)
Potassium: 5.2 mEq/L — ABNORMAL HIGH (ref 3.5–5.1)
Sodium: 135 mEq/L (ref 135–145)
Total Bilirubin: 0.5 mg/dL (ref 0.2–1.2)
Total Protein: 6.6 g/dL (ref 6.0–8.3)

## 2020-08-23 LAB — TSH: TSH: 1.59 u[IU]/mL (ref 0.35–4.50)

## 2020-08-23 LAB — HEMOGLOBIN A1C: Hgb A1c MFr Bld: 5.6 % (ref 4.6–6.5)

## 2020-08-23 LAB — FERRITIN: Ferritin: 16.2 ng/mL (ref 10.0–291.0)

## 2020-08-23 LAB — VITAMIN D 25 HYDROXY (VIT D DEFICIENCY, FRACTURES): VITD: 43.75 ng/mL (ref 30.00–100.00)

## 2020-08-24 ENCOUNTER — Encounter (HOSPITAL_COMMUNITY): Payer: Self-pay | Admitting: Psychiatry

## 2020-08-24 ENCOUNTER — Telehealth (INDEPENDENT_AMBULATORY_CARE_PROVIDER_SITE_OTHER): Payer: Medicare Other | Admitting: Psychiatry

## 2020-08-24 DIAGNOSIS — F4310 Post-traumatic stress disorder, unspecified: Secondary | ICD-10-CM | POA: Diagnosis not present

## 2020-08-24 DIAGNOSIS — F331 Major depressive disorder, recurrent, moderate: Secondary | ICD-10-CM

## 2020-08-24 MED ORDER — LAMOTRIGINE 100 MG PO TABS: 100.0000 mg | ORAL_TABLET | Freq: Every day | ORAL | 0 refills | Status: DC

## 2020-08-24 MED ORDER — VENLAFAXINE HCL ER 150 MG PO CP24: 150.0000 mg | ORAL_CAPSULE | Freq: Every day | ORAL | 0 refills | Status: DC

## 2020-08-24 NOTE — Progress Notes (Signed)
Virtual Visit via Telephone Note  I connected with Alexis [name] on 08/24/20 at 10:00 AM EST by telephone and verified that I am speaking with the correct person using two identifiers.  Location: Patient: home Provider: home office   I discussed the limitations, risks, security and privacy concerns of performing an evaluation and management service by telephone and the availability of in person appointments. I also discussed with the patient that there may be a patient responsible charge related to this service. The patient expressed understanding and agreed to proceed.   History of Present Illness: Patient is evaluated by phone session.  She is taking her medication as prescribed.  She feels her mood is a stable.  She tried to keep herself busy with her friends and her daughter also checks on her regularly.  She is somewhat frustrated because her television is not working.  She is hoping that cable guy come and fix the television.  She is sleeping good.  She has shoulder pain and now she will start physical therapy very soon.  She denies any irritability, anger, mania, crying spells.  She has mild tremors but that does not interfere in her daily activities.  She denies any nightmares or flashbacks.  She does not want to change the medication.   Past Psychiatric History: H/Odepression,abuseandoverdose. Taking Effexor for a while. Noh/omania, psychosishallucination.  Recent Results (from the past 2160 hour(s))  [facility] MAMMOGRAPHY     Status: None   Collection Time: 07/27/20 12:00 AM  Result Value Ref Range   HM Mammogram 0-4 Bi-Rad 0-4 Bi-Rad, Self Reported Normal  Hemoglobin A1c     Status: None   Collection Time: 08/23/20 10:58 AM  Result Value Ref Range   Hgb A1c MFr Bld 5.6 4.6 - 6.5 %    Comment: Glycemic Control Guidelines for People with Diabetes:Non Diabetic:  <6%Goal of Therapy: <7%Additional Action Suggested:  >8%   VITAMIN D 25 Hydroxy (Vit-D Deficiency, Fractures)      Status: None   Collection Time: 08/23/20 10:58 AM  Result Value Ref Range   VITD 43.75 30.00 - 100.00 ng/mL  CBC     Status: None   Collection Time: 08/23/20 10:58 AM  Result Value Ref Range   WBC 5.7 4.0 - 10.5 K/uL   RBC 4.09 3.87 - 5.11 Mil/uL   Platelets 201.0 150.0 - 400.0 K/uL   Hemoglobin 13.2 12.0 - 15.0 g/dL   HCT 11.9 14.7 - 82.9 %   MCV 95.3 78.0 - 100.0 fl   MCHC 33.7 30.0 - 36.0 g/dL   RDW 56.2 13.0 - 86.5 %  Comprehensive metabolic panel     Status: Abnormal   Collection Time: 08/23/20 10:58 AM  Result Value Ref Range   Sodium 135 135 - 145 mEq/L   Potassium 5.2 No hemolysis seen (H) 3.5 - 5.1 mEq/L   Chloride 99 96 - 112 mEq/L   CO2 28 19 - 32 mEq/L   Glucose, Bld 119 (H) 70 - 99 mg/dL   BUN 20 6 - 23 mg/dL   Creatinine, Ser 7.84 0.40 - 1.20 mg/dL   Total Bilirubin 0.5 0.2 - 1.2 mg/dL   Alkaline Phosphatase 59 39 - 117 U/L   AST 21 0 - 37 U/L   ALT 20 0 - 35 U/L   Total Protein 6.6 6.0 - 8.3 g/dL   Albumin 4.3 3.5 - 5.2 g/dL   GFR 69.62 >95.28 mL/min    Comment: Calculated using the CKD-EPI Creatinine Equation (2021)  Calcium 9.2 8.4 - 10.5 mg/dL  TSH     Status: None   Collection Time: 08/23/20 10:58 AM  Result Value Ref Range   TSH 1.59 0.35 - 4.50 uIU/mL  Ferritin     Status: None   Collection Time: 08/23/20 10:58 AM  Result Value Ref Range   Ferritin 16.2 10.0 - 291.0 ng/mL     Psychiatric Specialty Exam: Physical Exam  Review of Systems  Weight 139 lb (63 kg).Body mass index is 23.86 kg/m.  General Appearance: NA  Eye Contact:  NA  Speech:  Slow  Volume:  Decreased  Mood:  Euthymic  Affect:  NA  Thought Process:  Goal Directed  Orientation:  Full (Time, Place, and Person)  Thought Content:  WDL  Suicidal Thoughts:  No  Homicidal Thoughts:  No  Memory:  Immediate;   Good Recent;   Good Remote;   Good  Judgement:  Intact  Insight:  Good  Psychomotor Activity:  NA  Concentration:  Concentration: Fair and Attention Span: Fair   Recall:  [employer] of Knowledge:  Good  Language:  Good  Akathisia:  No  Handed:  Right  AIMS (if indicated):     Assets:  Communication Skills Desire for Improvement Housing Resilience Social Support  ADL's:  Intact  Cognition:  WNL  Sleep:   ok     Assessment and Plan: Major depressive disorder, recurrent.  PTSD.  I reviewed blood work results.  She will start physical therapy for her shoulder pain.  She has no rash and itching.  Continue Lamictal 100 mg daily and Effexor XR 150 mg in the morning.  Recommended to call us back if is any question or any concern.  Follow-up in 3 months.  Follow Up Instructions:    I discussed the assessment and treatment plan with the patient. The patient was provided an opportunity to ask questions and all were answered. The patient agreed with the plan and demonstrated an understanding of the instructions.   The patient was advised to call back or seek an in-person evaluation if the symptoms worsen or if the condition fails to improve as anticipated.  I provided 19 minutes of non-face-to-face time during this encounter.   Cleotis Nipper, MD

## 2020-08-31 ENCOUNTER — Ambulatory Visit (INDEPENDENT_AMBULATORY_CARE_PROVIDER_SITE_OTHER): Payer: Medicare Other

## 2020-08-31 VITALS — Ht 63.0 in | Wt 139.0 lb

## 2020-08-31 DIAGNOSIS — Z0000 Encounter for general adult medical examination without abnormal findings: Secondary | ICD-10-CM | POA: Diagnosis not present

## 2020-08-31 NOTE — Patient Instructions (Signed)
Alexis [name] , Thank you for taking time to complete your Medicare Wellness Visit. I appreciate your ongoing commitment to your health goals. Please review the following plan we discussed and let me know if I can assist you in the future.   Screening recommendations/referrals: Colonoscopy: No longer required Mammogram: Completed 07/27/2020-Due 07/27/2021 Bone Density: Completed 02/28/2020-Due 02/27/2022 Recommended yearly ophthalmology/optometry visit for glaucoma screening and checkup Recommended yearly dental visit for hygiene and checkup  Vaccinations: Influenza vaccine: Up to date Pneumococcal vaccine: Completed vaccines Tdap vaccine: Up to date-Due 12/02/2027 Shingles vaccine: Completed vaccines Covid-19:Completed vaccines  Advanced directives: Information mailed today.  Conditions/risks identified: See problem list  Next appointment: Follow up in one year for your annual wellness visit    Preventive Care 65 Years and Older, Female Preventive care refers to lifestyle choices and visits with your health care provider that can promote health and wellness. What does preventive care include?  A yearly physical exam. This is also called an annual well check.  Dental exams once or twice a year.  Routine eye exams. Ask your health care provider how often you should have your eyes checked.  Personal lifestyle choices, including:  Daily care of your teeth and gums.  Regular physical activity.  Eating a healthy diet.  Avoiding tobacco and drug use.  Limiting alcohol use.  Practicing safe sex.  Taking low-dose aspirin every day.  Taking vitamin and mineral supplements as recommended by your health care provider. What happens during an annual well check? The services and screenings done by your health care provider during your annual well check will depend on your age, overall health, lifestyle risk factors, and family history of disease. Counseling  Your health care provider  may ask you questions about your:  Alcohol use.  Tobacco use.  Drug use.  Emotional well-being.  Home and relationship well-being.  Sexual activity.  Eating habits.  History of falls.  Memory and ability to understand (cognition).  Work and work Astronomer.  Reproductive health. Screening  You may have the following tests or measurements:  Height, weight, and BMI.  Blood pressure.  Lipid and cholesterol levels. These may be checked every 5 years, or more frequently if you are over 59 years old.  Skin check.  Lung cancer screening. You may have this screening every year starting at age 62 if you have a 30-pack-year history of smoking and currently smoke or have quit within the past 15 years.  Fecal occult blood test (FOBT) of the stool. You may have this test every year starting at age 33.  Flexible sigmoidoscopy or colonoscopy. You may have a sigmoidoscopy every 5 years or a colonoscopy every 10 years starting at age 78.  Hepatitis C blood test.  Hepatitis B blood test.  Sexually transmitted disease (STD) testing.  Diabetes screening. This is done by checking your blood sugar (glucose) after you have not eaten for a while (fasting). You may have this done every 1-3 years.  Bone density scan. This is done to screen for osteoporosis. You may have this done starting at age 63.  Mammogram. This may be done every 1-2 years. Talk to your health care provider about how often you should have regular mammograms. Talk with your health care provider about your test results, treatment options, and if necessary, the need for more tests. Vaccines  Your health care provider may recommend certain vaccines, such as:  Influenza vaccine. This is recommended every year.  Tetanus, diphtheria, and acellular pertussis (Tdap, Td) vaccine.  You may need a Td booster every 10 years.  Zoster vaccine. You may need this after age 28.  Pneumococcal 13-valent conjugate (PCV13) vaccine.  One dose is recommended after age 66.  Pneumococcal polysaccharide (PPSV23) vaccine. One dose is recommended after age 57. Talk to your health care provider about which screenings and vaccines you need and how often you need them. This information is not intended to replace advice given to you by your health care provider. Make sure you discuss any questions you have with your health care provider. Document Released: 06/30/2015 Document Revised: 02/21/2016 Document Reviewed: 04/04/2015 Elsevier Interactive Patient Education  2017 [employer].  Fall Prevention in the Home Falls can cause injuries. They can happen to people of all ages. There are many things you can do to make your home safe and to help prevent falls. What can I do on the outside of my home?  Regularly fix the edges of walkways and driveways and fix any cracks.  Remove anything that might make you trip as you walk through a door, such as a raised step or threshold.  Trim any bushes or trees on the path to your home.  Use bright outdoor lighting.  Clear any walking paths of anything that might make someone trip, such as rocks or tools.  Regularly check to see if handrails are loose or broken. Make sure that both sides of any steps have handrails.  Any raised decks and porches should have guardrails on the edges.  Have any leaves, snow, or ice cleared regularly.  Use sand or salt on walking paths during winter.  Clean up any spills in your garage right away. This includes oil or grease spills. What can I do in the bathroom?  Use night lights.  Install grab bars by the toilet and in the tub and shower. Do not use towel bars as grab bars.  Use non-skid mats or decals in the tub or shower.  If you need to sit down in the shower, use a plastic, non-slip stool.  Keep the floor dry. Clean up any water that spills on the floor as soon as it happens.  Remove soap buildup in the tub or shower regularly.  Attach bath  mats securely with double-sided non-slip rug tape.  Do not have throw rugs and other things on the floor that can make you trip. What can I do in the bedroom?  Use night lights.  Make sure that you have a light by your bed that is easy to reach.  Do not use any sheets or blankets that are too big for your bed. They should not hang down onto the floor.  Have a firm chair that has side arms. You can use this for support while you get dressed.  Do not have throw rugs and other things on the floor that can make you trip. What can I do in the kitchen?  Clean up any spills right away.  Avoid walking on wet floors.  Keep items that you use a lot in easy-to-reach places.  If you need to reach something above you, use a strong step stool that has a grab bar.  Keep electrical cords out of the way.  Do not use floor polish or wax that makes floors slippery. If you must use wax, use non-skid floor wax.  Do not have throw rugs and other things on the floor that can make you trip. What can I do with my stairs?  Do not leave any  items on the stairs.  Make sure that there are handrails on both sides of the stairs and use them. Fix handrails that are broken or loose. Make sure that handrails are as long as the stairways.  Check any carpeting to make sure that it is firmly attached to the stairs. Fix any carpet that is loose or worn.  Avoid having throw rugs at the top or bottom of the stairs. If you do have throw rugs, attach them to the floor with carpet tape.  Make sure that you have a light switch at the top of the stairs and the bottom of the stairs. If you do not have them, ask someone to add them for you. What else can I do to help prevent falls?  Wear shoes that:  Do not have high heels.  Have rubber bottoms.  Are comfortable and fit you well.  Are closed at the toe. Do not wear sandals.  If you use a stepladder:  Make sure that it is fully opened. Do not climb a closed  stepladder.  Make sure that both sides of the stepladder are locked into place.  Ask someone to hold it for you, if possible.  Clearly mark and make sure that you can see:  Any grab bars or handrails.  First and last steps.  Where the edge of each step is.  Use tools that help you move around (mobility aids) if they are needed. These include:  Canes.  Walkers.  Scooters.  Crutches.  Turn on the lights when you go into a dark area. Replace any light bulbs as soon as they burn out.  Set up your furniture so you have a clear path. Avoid moving your furniture around.  If any of your floors are uneven, fix them.  If there are any pets around you, be aware of where they are.  Review your medicines with your doctor. Some medicines can make you feel dizzy. This can increase your chance of falling. Ask your doctor what other things that you can do to help prevent falls. This information is not intended to replace advice given to you by your health care provider. Make sure you discuss any questions you have with your health care provider. Document Released: 03/30/2009 Document Revised: 11/09/2015 Document Reviewed: 07/08/2014 Elsevier Interactive Patient Education  2017 [employer].

## 2020-08-31 NOTE — Progress Notes (Signed)
Subjective:   Alexis [name] is a 75 y.o. female who presents for Medicare Annual (Subsequent) preventive examination.  I connected with Kayle today by telephone and verified that I am speaking with the correct person using two identifiers. Location patient: home Location provider: work Persons participating in the virtual visit: patient, Engineer, civil (consulting).    I discussed the limitations, risks, security and privacy concerns of performing an evaluation and management service by telephone and the availability of in person appointments. I also discussed with the patient that there may be a patient responsible charge related to this service. The patient expressed understanding and verbally consented to this telephonic visit.    Interactive audio and video telecommunications were attempted between this provider and patient, however failed, due to patient having technical difficulties OR patient did not have access to video capability.  We continued and completed visit with audio only.  Some vital signs may be absent or patient reported.   Time Spent with patient on telephone encounter: 25 minutes   Review of Systems     Cardiac Risk Factors include: advanced age (>51men, >23 women);diabetes mellitus     Objective:    Today's Vitals   08/31/20 1240  Weight: 139 lb (63 kg)  Height:  (1.6 m)  PainSc: 3    Body mass index is 24.62 kg/m.  Advanced Directives 08/31/2020 08/24/2019 11/04/2017  Does Patient Have a Medical Advance Directive? No No No  Does patient want to make changes to medical advance directive? Yes (MAU/Ambulatory/Procedural Areas - Information given) - -  Would patient like information on creating a medical advance directive? - No - Patient declined -  Some encounter information is confidential and restricted. Go to Review Flowsheets activity to see all data.    Current Medications (verified) Outpatient Encounter Medications as of 08/31/2020  Medication Sig  . aspirin 81  MG EC tablet Take 81 mg by mouth at bedtime. Swallow whole.  . b complex vitamins tablet Take 1 tablet by mouth 2 (two) times daily.  [name] BIOTIN 5000 PO Take 1 tablet by mouth 2 (two) times daily.  . calcium carbonate (OSCAL) 1500 (600 Ca) MG TABS tablet Take by mouth.  . Cholecalciferol (VITAMIN D3) 1.25 MG (50000 UT) CAPS Take 1 weekly for 12 weeks  . clindamycin (CLINDAGEL) 1 % gel Apply topically 2 (two) times daily.  . Cyanocobalamin (VITAMIN B12 TR PO) Take 500 mcg by mouth at bedtime.   . diphenhydrAMINE (BENADRYL) 25 mg capsule Take 25 mg by mouth at bedtime.  . fluticasone (FLONASE) 50 MCG/ACT nasal spray SPRAY 2 SPRAYS INTO EACH NOSTRIL EVERY DAY  . gabapentin (NEURONTIN) 300 MG capsule TAKE 1 CAPSULE BY MOUTH EVERY NIGHT  . glucose blood (ACCU-CHEK ACTIVE STRIPS) test strip Use as instructed- up to once a day.  Dx E11.9  . lamoTRIgine (LAMICTAL) 100 MG tablet Take 1 tablet (100 mg total) by mouth daily.  [name] loperamide (IMODIUM A-D) 2 MG tablet Take 2 mg by mouth as needed.   . meloxicam (MOBIC) 15 MG tablet Take 1 tablet (15 mg total) by mouth daily.  [name] omeprazole (PRILOSEC) 40 MG capsule TAKE 1 CAPSULE BY MOUTH TWICE A DAY  . polyethylene glycol powder (GLYCOLAX/MIRALAX) powder Take 1 Container by mouth once.  . primidone (MYSOLINE) 50 MG tablet TAKE 2 TABLETS EVERY MORNING TAKE 1 TABLET AT NOON AND TAKE 1 TABLET AT SUPPER  . triamcinolone (KENALOG) 0.025 % cream Apply to lip twice daily for 10 days.  [name] venlafaxine  XR (EFFEXOR-XR) 150 MG 24 hr capsule Take 1 capsule (150 mg total) by mouth daily.   Facility-Administered Encounter Medications as of 08/31/2020  Medication  . denosumab (PROLIA) injection 60 mg    Allergies (verified) Metformin   History: Past Medical History:  Diagnosis Date  . Allergy   . Anemia   . Arthritis   . Asthma   . Back pain   . Barrett's esophagus   . Bladder infection   . Cataract   . Depression   . Diabetes mellitus, type II (HCC)   .  Gallstones   . Gastritis   . GERD (gastroesophageal reflux disease)   . Hyperlipidemia   . Memory deficits   . Osteoporosis 12/09/2017  . PTSD (post-traumatic stress disorder)   . Sleep apnea   . Thyroid disease    Patient denies   Past Surgical History:  Procedure Laterality Date  . CHOLECYSTECTOMY  1999  . COLONOSCOPY    . ESOPHAGOGASTRODUODENOSCOPY    . GANGLION CYST EXCISION Left   . GASTRIC BYPASS  2000  . TONSILLECTOMY AND ADENOIDECTOMY     age 64   Family History  Problem Relation Age of Onset  . Depression Mother   . Heart disease Mother   . Kidney disease Mother   . Alcohol abuse Father   . Tremor Father   . Prostate cancer Father   . Other Father        Hypoglycemia  . Heart disease Father   . Asthma Father   . Diabetes Paternal Grandmother   . Other Daughter        stillborn  . Colon cancer Neg Hx   . Esophageal cancer Neg Hx   . Rectal cancer Neg Hx   . Stomach cancer Neg Hx   . Allergic rhinitis Neg Hx   . Eczema Neg Hx   . Urticaria Neg Hx   . Immunodeficiency Neg Hx   . Angioedema Neg Hx    Social History   Socioeconomic History  . Marital status: Divorced    Spouse name: Not on file  . Number of children: 3  . Years of education: Not on file  . Highest education level: Not on file  Occupational History  . Occupation: retired  Tobacco Use  . Smoking status: Never Smoker  . Smokeless tobacco: Never Used  Vaping Use  . Vaping Use: Never used  Substance and Sexual Activity  . Alcohol use: No    Alcohol/week: 0.0 standard drinks  . Drug use: No  . Sexual activity: Never  Other Topics Concern  . Not on file  Social History Narrative   Married second time after a divorce   1 son and 1 daughter living 1 daughter was stillborn   3 caffeine (coffee)/day   Retired from Engineer, mining of Corporate investment banker Strain: Low Risk   . Difficulty of Paying Living Expenses: Not hard at all  Food Insecurity: No Food  Insecurity  . Worried About Programme researcher, broadcasting/film/video in the Last Year: Never true  . Ran Out of Food in the Last Year: Never true  Transportation Needs: No Transportation Needs  . Lack of Transportation (Medical): No  . Lack of Transportation (Non-Medical): No  Physical Activity: Sufficiently Active  . Days of Exercise per Week: 7 days  . Minutes of Exercise per Session: 30 min  Stress: No Stress Concern Present  . Feeling of Stress : Not at all  Social Connections:  Moderately Integrated  . Frequency of Communication with Friends and Family: More than three times a week  . Frequency of Social Gatherings with Friends and Family: More than three times a week  . Attends Religious Services: More than 4 times per year  . Active Member of Clubs or Organizations: Yes  . Attends Banker Meetings: More than 4 times per year  . Marital Status: Divorced    Tobacco Counseling Counseling given: Not Answered   Clinical Intake:  Pre-visit preparation completed: Yes  Pain : 0-10 Pain Score: 3  Pain Type: Chronic pain Pain Location: Shoulder Pain Orientation: Left Pain Onset: More than a month ago Pain Frequency: Constant     Nutritional Status: BMI of 19-24  Normal Nutritional Risks: None Diabetes: Yes CBG done?: No Did pt. bring in CBG monitor from home?: No (phone visit)  How often do you need to have someone help you when you read instructions, pamphlets, or other written materials from your doctor or pharmacy?: 1 - Never  Diabetes:  Is the patient diabetic?  Yes  If diabetic, was a CBG obtained today?  No  Did the patient bring in their glucometer from home?  No phone visit How often do you monitor your CBG's? never.   Financial Strains and Diabetes Management:  Are you having any financial strains with the device, your supplies or your medication? No .  Does the patient want to be seen by Chronic Care Management for management of their diabetes?  No  Would the  patient like to be referred to a Nutritionist or for Diabetic Management?  No   Diabetic Exams:  Diabetic Eye Exam: Patient states she had an eye exam 6 months ago. Requested report be sent to PCP.  Diabetic Foot Exam: . Pt has been advised about the importance in completing this exam. To be completed by PCP   Interpreter Needed?: No  Information entered by :: Thomasenia Sales LPN   Activities of Daily Living In your present state of health, do you have any difficulty performing the following activities: 08/31/2020  Hearing? N  Vision? N  Difficulty concentrating or making decisions? N  Walking or climbing stairs? N  Dressing or bathing? N  Doing errands, shopping? N  Preparing Food and eating ? N  Using the Toilet? N  In the past six months, have you accidently leaked urine? N  Do you have problems with loss of bowel control? N  Managing your Medications? N  Managing your Finances? N  Housekeeping or managing your Housekeeping? N  Some recent data might be hidden    Patient Care Team: [name], Gwenlyn Found, MD as PCP - General (Family Medicine)  Indicate any recent Medical Services you may have received from other than [facility] providers in the past year (date may be approximate).     Assessment:   This is a routine wellness examination for Alexis [name].  Hearing/Vision screen  Hearing Screening   125Hz  250Hz  500Hz  1000Hz  2000Hz  3000Hz  4000Hz  6000Hz  8000Hz   Right ear:           Left ear:           Comments: Mild hearing loss  Vision Screening Comments: Wears reading glasses Last eye exam-2021-Dr.Evans  Dietary issues and exercise activities discussed: Current Exercise Habits: Home exercise routine, Type of exercise: walking, Time (Minutes): 25, Frequency (Times/Week): 7, Weekly Exercise (Minutes/Week): 175, Intensity: Mild, Exercise limited by: None identified  Goals    . Maintain health and independence  Depression Screen PHQ 2/9 Scores 08/31/2020 08/24/2020 08/24/2019  03/12/2017  PHQ - 2 Score 0 0 0 0  Exception Documentation - - - Patient refusal  Some encounter information is confidential and restricted. Go to Review Flowsheets activity to see all data.    Fall Risk Fall Risk  08/31/2020 08/23/2020 08/24/2019 05/12/2019 03/12/2017  Falls in the past year? 0 0 0 0 Yes  Comment - - - Emmi Telephone Survey: data to providers prior to load -  Number falls in past yr: 0 0 0 - 1  Injury with Fall? 0 0 0 - Yes  Follow up Falls prevention discussed - Education provided;Falls prevention discussed - Falls prevention discussed    FALL RISK PREVENTION PERTAINING TO THE HOME:  Any stairs in or around the home? No  Home free of loose throw rugs in walkways, pet beds, electrical cords, etc? Yes  Adequate lighting in your home to reduce risk of falls? Yes   ASSISTIVE DEVICES UTILIZED TO PREVENT FALLS:  Life alert? No  Use of a cane, walker or w/c? No  Grab bars in the bathroom? No  Shower chair or bench in shower? No  Elevated toilet seat or a handicapped toilet? No   TIMED UP AND GO:  Was the test performed? No . Phone visit   Cognitive Function:Normal cognitive status assessed by  this Nurse Health Advisor. No abnormalities found.          Immunizations Immunization History  Administered Date(s) Administered  . Influenza Split 03/01/2017  . Influenza, High Dose Seasonal PF 02/10/2017, 04/06/2018, 03/03/2019, 03/03/2019  . Influenza-Unspecified 03/04/2019, 02/24/2020  . PFIZER(Purple Top)SARS-COV-2 Vaccination 07/29/2019, 08/23/2019, 05/03/2020  . Pneumococcal Conjugate-13 03/01/2012, 12/01/2017  . Pneumococcal Polysaccharide-23 11/26/2018  . Tdap 12/01/2017  . Zoster Recombinat (Shingrix) 12/30/2018, 03/03/2019    TDAP status: Up to date  Flu Vaccine status: Up to date  Pneumococcal vaccine status: Up to date  Covid-19 vaccine status: Completed vaccines  Qualifies for Shingles Vaccine? No   Zostavax completed No   Shingrix Completed?:  Yes  Screening Tests Health Maintenance  Topic Date Due  . URINE MICROALBUMIN  11/26/2019  . OPHTHALMOLOGY EXAM  03/28/2020  . FOOT EXAM  08/25/2020  . HEMOGLOBIN A1C  02/23/2021  . TETANUS/TDAP  12/02/2027  . INFLUENZA VACCINE  Completed  . DEXA SCAN  Completed  . COVID-19 Vaccine  Completed  . Hepatitis C Screening  Completed  . PNA vac Low Risk Adult  Completed  . HPV VACCINES  Aged Out  . COLONOSCOPY (Pts 45-72yrs Insurance coverage will need to be confirmed)  Discontinued    Health Maintenance  Health Maintenance Due  Topic Date Due  . URINE MICROALBUMIN  11/26/2019  . OPHTHALMOLOGY EXAM  03/28/2020  . FOOT EXAM  08/25/2020    Colorectal cancer screening: No longer required.   Mammogram status: Completed Bilateral 07/27/2020. Repeat every year  Bone Density status: Completed 02/28/2020. Results reflect: Bone density results: OSTEOPOROSIS. Repeat every 2 years.  Lung Cancer Screening: (Low Dose CT Chest recommended if Age 75-80 years, 30 pack-year currently smoking OR have quit w/in 15years.) does not qualify.    Additional Screening:  Hepatitis C Screening:Completed 12/01/2017  Vision Screening: Recommended annual ophthalmology exams for early detection of glaucoma and other disorders of the eye. Is the patient up to date with their annual eye exam?  Yes  Who is the provider or what is the name of the office in which the patient attends annual eye exams? Dr. Logan Bores  Dental Screening: Recommended annual dental exams for proper oral hygiene  Community Resource Referral / Chronic Care Management: CRR required this visit?  No   CCM required this visit?  No      Plan:     I have personally reviewed and noted the following in the patient's chart:   . Medical and social history . Use of alcohol, tobacco or illicit drugs  . Current medications and supplements . Functional ability and status . Nutritional status . Physical activity . Advanced  directives . List of other physicians . Hospitalizations, surgeries, and ER visits in previous 12 months . Vitals . Screenings to include cognitive, depression, and falls . Referrals and appointments  In addition, I have reviewed and discussed with patient certain preventive protocols, quality metrics, and best practice recommendations. A written personalized care plan for preventive services as well as general preventive health recommendations were provided to patient.   Due to this being a telephonic visit, the after visit summary with patients personalized plan was offered to patient via mail or my-chart.  per request, patient was mailed a copy of AVS.   Roanna RaiderMartha A Latajah Thuman, LPN   [id number]/17/2022  Nurse health Asvisor  Nurse Notes: None

## 2020-09-11 ENCOUNTER — Encounter: Payer: Self-pay | Admitting: Physical Therapy

## 2020-09-11 ENCOUNTER — Ambulatory Visit: Payer: Medicare Other | Attending: Family Medicine | Admitting: Physical Therapy

## 2020-09-11 ENCOUNTER — Other Ambulatory Visit: Payer: Self-pay

## 2020-09-11 DIAGNOSIS — M25612 Stiffness of left shoulder, not elsewhere classified: Secondary | ICD-10-CM | POA: Insufficient documentation

## 2020-09-11 DIAGNOSIS — M6281 Muscle weakness (generalized): Secondary | ICD-10-CM | POA: Insufficient documentation

## 2020-09-11 DIAGNOSIS — R293 Abnormal posture: Secondary | ICD-10-CM | POA: Insufficient documentation

## 2020-09-11 DIAGNOSIS — G8929 Other chronic pain: Secondary | ICD-10-CM | POA: Insufficient documentation

## 2020-09-11 DIAGNOSIS — M25512 Pain in left shoulder: Secondary | ICD-10-CM | POA: Insufficient documentation

## 2020-09-11 NOTE — Patient Instructions (Addendum)
   Access Code: [email or web address] URL: [email or web address] Date: 09/11/2020 Prepared by: Glenetta Hew  Exercises Standing Isometric Shoulder Flexion with Doorway - Arm Bent - 2 x daily - 7 x weekly - 10 reps - 2 sec hold Standing Isometric Shoulder External Rotation with Doorway and Towel Roll - 2 x daily - 7 x weekly - 10 reps - 2 sec hold Standing Isometric Shoulder Internal Rotation with Towel Roll at Doorway - 2 x daily - 7 x weekly - 10 reps - 2 sec hold Seated Gentle Upper Trapezius Stretch - 2 x daily - 7 x weekly - 3 reps - 30 sec hold

## 2020-09-11 NOTE — Therapy (Signed)
[facility] : [facility] [address]  [address] [place], [place], [address] Phone: [phone]   Fax:  [phone]  Physical Therapy Evaluation  Patient Details  Name: Alexis [name] MRN: [id number] Date of Birth: [birth year] Referring Provider (PT): Abbe Amsterdam, MD   Encounter Date: 09/11/2020   PT End of Session - 09/11/20 1512    Visit Number 1    Number of Visits 12    Date for PT Re-Evaluation 10/23/20    Authorization Type Medicare & Cign[employer] Supplement VL: Follows Medicare guidelines    PT Start Time 1400    PT Stop Time 1502    PT Time Calculation (min) 62 min    Activity Tolerance Patient tolerated treatment well    Behavior During Therapy [facility] for tasks assessed/performed           Past Medical History:  Diagnosis Date  . Allergy   . Anemia   . Arthritis   . Asthma   . Back pain   . Barrett's esophagus   . Bladder infection   . Cataract   . Depression   . Diabetes mellitus, type II (HCC)   . Gallstones   . Gastritis   . GERD (gastroesophageal reflux disease)   . Hyperlipidemia   . Memory deficits   . Osteoporosis 12/09/2017  . PTSD (post-traumatic stress disorder)   . Sleep apnea   . Thyroid disease    Patient denies    Past Surgical History:  Procedure Laterality Date  . CHOLECYSTECTOMY  1999  . COLONOSCOPY    . ESOPHAGOGASTRODUODENOSCOPY    . GANGLION CYST EXCISION Left   . GASTRIC BYPASS  2000  . TONSILLECTOMY AND ADENOIDECTOMY     age 75    There were no vitals filed for this visit.    Subjective Assessment - 09/11/20 1410    Subjective Alexis [name] reports her doctor diagnosed her with tendonitis in left shoulder; last week would have rated it a 10/10 with external rotation movement, seems to be calming. She is unable to do previous activities, uses R arm for ADL's instead.    Patient Stated Goals Releive pain in arm so she can use it again and not rely on the right only.    Currently in Pain? Other  (Comment)    Pain Score 0-No pain   No pain with resting; 8/10 at worst   Pain Location Shoulder    Pain Orientation Left    Pain Descriptors / Indicators Shooting    Pain Type Chronic pain    Pain Radiating Towards None    Pain Onset More than a month ago    Pain Frequency Intermittent    Aggravating Factors  Hooking Bra; motion like throwing a baseball; ER/IR   his is what brought on the 10/10 pain   Pain Relieving Factors Rest    Effect of Pain on Daily Activities Has been avoiding specific activities due to pain; Using opposite hand to due chores, etc.              [facility] PT Assessment - 09/11/20 1400      Assessment   Medical Diagnosis L RC Tendonitis    Referring Provider (PT) Abbe Amsterdam, MD    Onset Date/Surgical Date --   About two months ago   Hand Dominance Right    Next MD Visit 02/26/2021    Prior Therapy No history      Precautions   Precautions None  Restrictions   Weight Bearing Restrictions No      Balance Screen   Has the patient fallen in the past 6 months No    Has the patient had a decrease in activity level because of a fear of falling?  No    Is the patient reluctant to leave their home because of a fear of falling?  Yes      Home Environment   Living Environment Private residence    Living Arrangements Alone    Available Help at Discharge Friend(s);Neighbor    Home Access Other (comment)    Home Layout One level    Home Equipment None    Additional Comments Can't do stairs; Bad foot      Prior Function   Level of Independence Independent    Vocation Retired    Leisure Walking 7x/weekly about 8,000 steps; cooking; cleaning house; reading; Technical sales engineerwathcing tv,      Cognition   Overall Cognitive Status Within Functional Limits for tasks assessed      Observation/Other Assessments   Focus on Therapeutic Outcomes (FOTO)  Shoulder 51 Initial; 62 is Predicted      Posture/Postural Control   Posture Comments FHP, Rounded Shoulders, Dowagers  Hump; Increased Thoracic Kyphosis      AROM   Left Shoulder Flexion 125 Degrees    Left Shoulder ABduction 105 Degrees    Left Shoulder Internal Rotation 84 Degrees    Left Shoulder External Rotation 52 Degrees      Strength   Overall Strength --   IR/ER completed in sitting with Arm in 90ABD-90FLEX   Right Shoulder Flexion 4-/5    Right Shoulder ABduction 4/5    Right Shoulder Internal Rotation 3+/5    Right Shoulder External Rotation 3+/5    Left Shoulder Flexion 4-/5    Left Shoulder ABduction 4/5    Left Shoulder Internal Rotation 3+/5    Left Shoulder External Rotation 3/5      Hawkins-Kennedy test   Findings Negative      Lift-Off test   Findings Positive    Side Left      Hornblowers Sign   Findings Negative      Empty Can test   Findings Negative      Drop Arm test   Findings Positive    Side Left      Painful Arc of Motion   Findings Positive    Side Left    Comments Positive dropping sign on retrun from painful arc.                      Objective measurements completed on examination: See above findings.       [facility] Adult PT Treatment/Exercise - 09/11/20 0001      Exercises   Exercises Shoulder                  PT Education - 09/11/20 1511    Education Details Pt. education provided on HEP    Person(s) Educated Patient    Methods Explanation;Tactile cues;Verbal cues;Handout;Demonstration    Comprehension Verbalized understanding;Returned demonstration;Verbal cues required;Tactile cues required;Need further instruction            PT Short Term Goals - 09/11/20 1531      PT SHORT TERM GOAL #1   Title Pt. will be independent with initial HEP    Status New    Target Date 09/25/20      PT SHORT TERM GOAL #2  Title Pt. will increase shoulder ABD by >/=10 degrees to improve functional movement    Status New    Target Date 10/02/20             PT Long Term Goals - 09/11/20 1543      PT LONG TERM GOAL #1    Title Pt. will demonstrate independence with ongoing HEP for continuing improvements after DC    Status New    Target Date 10/23/20      PT LONG TERM GOAL #2   Title Pt. will improve B Shoulder strength to >/= 4+/5    Status New    Target Date 10/23/20      PT LONG TERM GOAL #3   Title Pt. will demonstrate L shoulder ROM to [facility] so she can return to using her L arm as much as her right    Status New    Target Date 10/23/20      PT LONG TERM GOAL #4   Title Pt. will demonstrate corrected posture for FHP and Rounded shoulders to decrease muscle tension on scapular stabilizers    Status New    Target Date 10/23/20                  Plan - 09/11/20 1514    Clinical Impression Statement Alexis [name] was referred to outpatient PT with L Shoulder RC Tendonitis. She reports pain that is highly irritated by ADL's such as putting on a bra. She remembers using her L arm for a lot of lifting before injury two months ago. She presents with B Shoulder weakness L>R, limited L Shoulder ROM, and positive painful arc with dropping sign, positive liftoff sign, and positive dropping sign tests indicating weakness in the RC including infraspinatus and subscapularis. Pt. will benefit from skilled PT to improve ROM and L shoulder strength so she can return to PLOF with her L arm and depend less on her R.    Personal Factors and Comorbidities Comorbidity 3+;Age    Comorbidities Osteoporsis, Arthritis, DM Type2, GERD    Examination-Activity Limitations Dressing;Reach Overhead;Lift    Examination-Participation Restrictions Cleaning;Laundry;Meal Prep;Driving    Stability/Clinical Decision Making Stable/Uncomplicated    Clinical Decision Making Low    Rehab Potential Good    PT Frequency 2x / week    PT Duration 6 weeks    PT Treatment/Interventions ADLs/Self Care Home Management;Cryotherapy;Electrical Stimulation;Iontophoresis 4mg /ml Dexamethasone;Moist Heat;Ultrasound;Fluidtherapy;Neuromuscular  re-education;Therapeutic exercise;Therapeutic activities;Patient/family education;Manual techniques;Taping;Passive range of motion;Dry needling;Vasopneumatic Device;Joint Manipulations    PT Next Visit Plan Assess PROM for B. Shoulder; AROM R Shoulder; STM as indicated; Progress RC strengthening with isometrics; AAROM; Scapular stabilizer strengthening; Stretches to address posture    Consulted and Agree with Plan of Care Patient           Patient will benefit from skilled therapeutic intervention in order to improve the following deficits and impairments:  Decreased endurance,Hypomobility,Increased muscle spasms,Decreased range of motion,Impaired tone,Improper body mechanics,Decreased activity tolerance,Decreased strength,Increased fascial restricitons,Impaired flexibility,Impaired UE functional use,Postural dysfunction,Pain  Visit Diagnosis: Chronic left shoulder pain  Stiffness of left shoulder, not elsewhere classified  Abnormal posture  Muscle weakness (generalized)     Problem List Patient Active Problem List   Diagnosis Date Noted  . Osteoporosis 12/09/2017  . Syncope 07/17/2017  . Shortness of breath 07/17/2017  . Hyponatremia 03/05/2017  . Lichen simplex chronicus 03/05/2017  . Type 2 diabetes mellitus without complication, without long-term current use of insulin (HCC) 03/05/2017  . Perianal lesion 01/31/2017  . Bilateral carotid  artery stenosis 12/12/2016  . Restless legs 12/11/2016  . History of gastric bypass 12/11/2016  . PTSD (post-traumatic stress disorder) 12/11/2016  . Risk for falls 12/10/2016  . Other dysphagia 08/19/2016  . Right foot drop 08/19/2016  . Chronic vulvitis 12/25/2015  . Microcalcification of left breast on mammogram 05/11/2015  . Tension type headache 06/14/2013  . Essential tremor 04/13/2013  . Hypersomnia 04/13/2013    Janalyn Harder SPT 09/11/2020, 7:00 PM  [facility] [address]  [address] [place], [place], [address] Phone: [phone]   Fax:  [phone]  Name: Alexis [name] MRN: [id number] Date of Birth: [birth year]

## 2020-09-18 ENCOUNTER — Ambulatory Visit: Payer: Medicare Other

## 2020-09-25 ENCOUNTER — Telehealth: Payer: Self-pay | Admitting: Family Medicine

## 2020-09-25 NOTE — Telephone Encounter (Signed)
Patient states she needs a letter on stationary paper with the date of 06/21/20 stating her weight goal is 145lb. (This letter is going to replace previous letter)

## 2020-09-26 ENCOUNTER — Ambulatory Visit: Payer: Medicare Other | Attending: Family Medicine

## 2020-09-26 ENCOUNTER — Other Ambulatory Visit: Payer: Self-pay

## 2020-09-26 DIAGNOSIS — R293 Abnormal posture: Secondary | ICD-10-CM

## 2020-09-26 DIAGNOSIS — M25512 Pain in left shoulder: Secondary | ICD-10-CM | POA: Diagnosis not present

## 2020-09-26 DIAGNOSIS — G8929 Other chronic pain: Secondary | ICD-10-CM | POA: Diagnosis not present

## 2020-09-26 DIAGNOSIS — M6281 Muscle weakness (generalized): Secondary | ICD-10-CM | POA: Diagnosis not present

## 2020-09-26 DIAGNOSIS — M25612 Stiffness of left shoulder, not elsewhere classified: Secondary | ICD-10-CM | POA: Diagnosis not present

## 2020-09-26 NOTE — Therapy (Signed)
[facility] Outpatient Rehabilitation [facility] [address]  [address] [place], [place], [address] Phone: [phone]   Fax:  [phone]  Physical Therapy Treatment  Patient Details  Name: Alexis [name] MRN: [id number] Date of Birth: 02/22/1946 Referring Provider (PT): Abbe Amsterdam, MD   Encounter Date: 09/26/2020   PT End of Session - 09/26/20 1445    Visit Number 2    Number of Visits 12    Date for PT Re-Evaluation 10/23/20    Authorization Type Medicare & Cigna Supplement VL: Follows Medicare guidelines    PT Start Time 1400    PT Stop Time 1443    PT Time Calculation (min) 43 min    Activity Tolerance Patient tolerated treatment well    Behavior During Therapy [facility] for tasks assessed/performed           Past Medical History:  Diagnosis Date  . Allergy   . Anemia   . Arthritis   . Asthma   . Back pain   . Barrett's esophagus   . Bladder infection   . Cataract   . Depression   . Diabetes mellitus, type II (HCC)   . Gallstones   . Gastritis   . GERD (gastroesophageal reflux disease)   . Hyperlipidemia   . Memory deficits   . Osteoporosis 12/09/2017  . PTSD (post-traumatic stress disorder)   . Sleep apnea   . Thyroid disease    Patient denies    Past Surgical History:  Procedure Laterality Date  . CHOLECYSTECTOMY  1999  . COLONOSCOPY    . ESOPHAGOGASTRODUODENOSCOPY    . GANGLION CYST EXCISION Left   . GASTRIC BYPASS  2000  . TONSILLECTOMY AND ADENOIDECTOMY     age 13    There were no vitals filed for this visit.   Subjective Assessment - 09/26/20 1359    Subjective Pt reports that her shoulder doesn't seem to bother her as much, feels she needs to review her home exercises.    Patient Stated Goals Releive pain in arm so she can use it again and not rely on the right only.    Currently in Pain? No/denies                             [facility] Adult PT Treatment/Exercise - 09/26/20 0001      Exercises    Exercises Shoulder;Neck      Shoulder Exercises: Supine   External Rotation AAROM;Left;10 reps    External Rotation Limitations with wand; towel under elbow    Flexion AAROM;Both;10 reps    Flexion Limitations with wand    ABduction AAROM;Left;10 reps    ABduction Limitations with wand      Shoulder Exercises: Standing   Row Strengthening;Both;10 reps;Theraband    Theraband Level (Shoulder Row) Level 1 (Yellow)    Row Limitations cues for scap retraction      Shoulder Exercises: Isometric Strengthening   Flexion --   10x2"   Flexion Limitations standing with towel against wall    Extension Limitations standing with towel against wall    External Rotation --   10x2"   Internal Rotation --   10x2"   Internal Rotation Limitations standing with towel against wall      Manual Therapy   Manual Therapy Passive ROM    Passive ROM R shoulder all motions, pain free; limitations in abduction and ER      Neck Exercises: Stretches  Upper Trapezius Stretch Right;Left;1 rep;30 seconds                    PT Short Term Goals - 09/26/20 1608      PT SHORT TERM GOAL #1   Title Pt. will be independent with initial HEP    Status On-going    Target Date 09/25/20      PT SHORT TERM GOAL #2   Title Pt. will increase shoulder ABD by >/=10 degrees to improve functional movement    Status On-going    Target Date 10/02/20             PT Long Term Goals - 09/26/20 1608      PT LONG TERM GOAL #1   Title Pt. will demonstrate independence with ongoing HEP for continuing improvements after DC    Status On-going      PT LONG TERM GOAL #2   Title Pt. will improve B Shoulder strength to >/= 4+/5    Status On-going      PT LONG TERM GOAL #3   Title Pt. will demonstrate L shoulder ROM to [facility] so she can return to using her L arm as much as her right    Status On-going      PT LONG TERM GOAL #4   Title Pt. will demonstrate corrected posture for FHP and Rounded shoulders to decrease  muscle tension on scapular stabilizers    Status On-going                 Plan - 09/26/20 1446    Clinical Impression Statement Pt came in reporting increased ability to put bra on and more ROM in her L shoulder. Pt had not much limitations in PROM but some irritation in the L shoulder with abd and ER. She needed cueing to avoid pushing into pain and irritaton in the L shoulder. Reviewed HEP with pt, giving her cueing to properly isolate shoulder muscles and avoid leaning with trunk. She shows increased fwd head and rounded shoulder posture with increased need for cueing to retract shoulder blades at rest and during exercises.    Personal Factors and Comorbidities Comorbidity 3+;Age    Comorbidities Osteoporsis, Arthritis, DM Type2, GERD    PT Frequency 2x / week    PT Duration 6 weeks    PT Treatment/Interventions ADLs/Self Care Home Management;Cryotherapy;Electrical Stimulation;Iontophoresis 4mg /ml Dexamethasone;Moist Heat;Ultrasound;Fluidtherapy;Neuromuscular re-education;Therapeutic exercise;Therapeutic activities;Patient/family education;Manual techniques;Taping;Passive range of motion;Dry needling;Vasopneumatic Device;Joint Manipulations    PT Next Visit Plan Assess PROM for B. Shoulder; AROM R Shoulder; STM as indicated; Progress RC strengthening with isometrics; AAROM; Scapular stabilizer strengthening; Stretches to address posture    Consulted and Agree with Plan of Care Patient           Patient will benefit from skilled therapeutic intervention in order to improve the following deficits and impairments:  Decreased endurance,Hypomobility,Increased muscle spasms,Decreased range of motion,Impaired tone,Improper body mechanics,Decreased activity tolerance,Decreased strength,Increased fascial restricitons,Impaired flexibility,Impaired UE functional use,Postural dysfunction,Pain  Visit Diagnosis: Chronic left shoulder pain  Stiffness of left shoulder, not elsewhere  classified  Abnormal posture  Muscle weakness (generalized)     Problem List Patient Active Problem List   Diagnosis Date Noted  . Osteoporosis 12/09/2017  . Syncope 07/17/2017  . Shortness of breath 07/17/2017  . Hyponatremia 03/05/2017  . Lichen simplex chronicus 03/05/2017  . Type 2 diabetes mellitus without complication, without long-term current use of insulin (HCC) 03/05/2017  . Perianal lesion 01/31/2017  . Bilateral carotid artery  stenosis 12/12/2016  . Restless legs 12/11/2016  . History of gastric bypass 12/11/2016  . PTSD (post-traumatic stress disorder) 12/11/2016  . Risk for falls 12/10/2016  . Other dysphagia 08/19/2016  . Right foot drop 08/19/2016  . Chronic vulvitis 12/25/2015  . Microcalcification of left breast on mammogram 05/11/2015  . Tension type headache 06/14/2013  . Essential tremor 04/13/2013  . Hypersomnia 04/13/2013    Darleene Cleaver, PTA 09/26/2020, 4:18 PM  [facility] [address]  [address] [place], [place], [address] Phone: [phone]   Fax:  [phone]  Name: Alexis [name] MRN: [id number] Date of Birth: [birth year]

## 2020-09-26 NOTE — Telephone Encounter (Signed)
Spoke with patient, letter updated, printed. She will pick up from front desk this afternoon. Letter has been placed up front for patient.

## 2020-09-28 ENCOUNTER — Telehealth: Payer: Self-pay | Admitting: Family Medicine

## 2020-09-28 DIAGNOSIS — H40013 Open angle with borderline findings, low risk, bilateral: Secondary | ICD-10-CM | POA: Diagnosis not present

## 2020-09-28 DIAGNOSIS — H53462 Homonymous bilateral field defects, left side: Secondary | ICD-10-CM | POA: Diagnosis not present

## 2020-09-28 DIAGNOSIS — H04123 Dry eye syndrome of bilateral lacrimal glands: Secondary | ICD-10-CM | POA: Diagnosis not present

## 2020-09-28 NOTE — Telephone Encounter (Signed)
Patient states she got a bill from [facility] that states medicare denied her 03/09 Lab draw. She states she was told that the an additional code should be used and not just office visit.  I asked if he noted what lab was denied she said it was the draw of the lab. Please see if code or billing can be corrected so medicare would cover.

## 2020-10-02 ENCOUNTER — Other Ambulatory Visit: Payer: Self-pay

## 2020-10-02 ENCOUNTER — Ambulatory Visit: Payer: Medicare Other | Admitting: Physical Therapy

## 2020-10-02 ENCOUNTER — Encounter: Payer: Self-pay | Admitting: Physical Therapy

## 2020-10-02 DIAGNOSIS — M25612 Stiffness of left shoulder, not elsewhere classified: Secondary | ICD-10-CM

## 2020-10-02 DIAGNOSIS — M25512 Pain in left shoulder: Secondary | ICD-10-CM | POA: Diagnosis not present

## 2020-10-02 DIAGNOSIS — R293 Abnormal posture: Secondary | ICD-10-CM | POA: Diagnosis not present

## 2020-10-02 DIAGNOSIS — M6281 Muscle weakness (generalized): Secondary | ICD-10-CM | POA: Diagnosis not present

## 2020-10-02 DIAGNOSIS — G8929 Other chronic pain: Secondary | ICD-10-CM

## 2020-10-02 NOTE — Patient Instructions (Signed)
  Access Code: [email or web address] URL: [email or web address] Date: 10/02/2020 Prepared by: Glenetta Hew  Exercises Seated Cervical Retraction - 2 x daily - 7 x weekly - 2 sets - 10 reps - 3 hold Standing Bilateral Low Shoulder Row with Anchored Resistance - 1 x daily - 7 x weekly - 2 sets - 10 reps

## 2020-10-02 NOTE — Therapy (Signed)
[facility] [address]  [place], [place], [address] Phone: [phone]   Fax:  [phone]  Physical Therapy Treatment  Patient Details  Name: Alexis [name] MRN: [id number] Date of Birth: December 16, 1945 Referring Provider (PT): Lamar Blinks, MD   Encounter Date: 10/02/2020   PT End of Session - 10/02/20 1455    Visit Number 3    Number of Visits 12    Date for PT Re-Evaluation 10/23/20    Authorization Type Medicare & Cign[employer] Supplement VL: Follows Medicare guidelines    PT Start Time 1400    PT Stop Time 1444    PT Time Calculation (min) 44 min    Activity Tolerance Patient tolerated treatment well    Behavior During Therapy [facility] for tasks assessed/performed           Past Medical History:  Diagnosis Date  . Allergy   . Anemia   . Arthritis   . Asthma   . Back pain   . Barrett's esophagus   . Bladder infection   . Cataract   . Depression   . Diabetes mellitus, type II ([place])   . Gallstones   . Gastritis   . GERD (gastroesophageal reflux disease)   . Hyperlipidemia   . Memory deficits   . Osteoporosis 12/09/2017  . PTSD (post-traumatic stress disorder)   . Sleep apnea   . Thyroid disease    Patient denies    Past Surgical History:  Procedure Laterality Date  . CHOLECYSTECTOMY  1999  . COLONOSCOPY    . ESOPHAGOGASTRODUODENOSCOPY    . GANGLION CYST EXCISION Left   . GASTRIC BYPASS  2000  . TONSILLECTOMY AND ADENOIDECTOMY     age 75    There were no vitals filed for this visit.   Subjective Assessment - 10/02/20 1406    Subjective Pt reports that some days her shoulder is good and some days not so good. Believes the rain is the reason. The HEP has been going well.    Patient Stated Goals Releive pain in arm so she can use it again and not rely on the right only.    Currently in Pain? Yes    Pain Score 7     Pain Location Shoulder    Pain Orientation Left    Pain Descriptors / Indicators  Sharp    Pain Frequency Intermittent   The pain comes and goes with certain motions             [facility] PT Assessment - 10/02/20 1416      AROM   Right Shoulder Flexion 162 Degrees    Right Shoulder ABduction 126 Degrees    Right Shoulder Internal Rotation 90 Degrees    Right Shoulder External Rotation 68 Degrees    Left Shoulder Flexion 140 Degrees    Left Shoulder ABduction 113 Degrees    Left Shoulder Internal Rotation 85 Degrees    Left Shoulder External Rotation 59 Degrees      PROM   Left Shoulder Flexion 147 Degrees    Left Shoulder ABduction 117 Degrees    Left Shoulder Internal Rotation 90 Degrees   pain at end range   Left Shoulder External Rotation 84 Degrees   pain at end range                        [facility] Adult PT Treatment/Exercise - 10/02/20 1400      Exercises  Exercises Shoulder;Neck      Neck Exercises: Machines for Strengthening   UBE (Upper Arm Bike) L1 x 3 forward 3 backwards      Neck Exercises: Seated   Neck Retraction 10 reps;3 secs    Neck Retraction Limitations Cuing to keep shoulders level      Shoulder Exercises: Supine   Flexion AAROM;10 reps;Left    Flexion Limitations with wand    ABduction AAROM;Left;10 reps    ABduction Limitations with wand      Shoulder Exercises: Seated   Row AROM;Strengthening;Both;10 reps    Theraband Level (Shoulder Row) Level 1 (Yellow)    Row Limitations Cuing for keeping shoulders level and correct use of arms      Manual Therapy   Manual Therapy Passive ROM    Passive ROM R shoulder all motions, pain free; limitations in abduction and ER                  PT Education - 10/02/20 1454    Education Details Education provided on HEP    Person(s) Educated Patient    Methods Explanation;Demonstration;Tactile cues;Verbal cues;Handout    Comprehension Tactile cues required;Verbal cues required;Returned demonstration;Verbalized understanding            PT Short Term Goals -  10/02/20 1501      PT SHORT TERM GOAL #1   Title Pt. will be independent with initial HEP    Status Achieved    Target Date 09/25/20      PT SHORT TERM GOAL #2   Title Pt. will increase shoulder ABD by >/=10 degrees to improve functional movement    Status Partially Met   L shoulder ABD increased by 8 degrees   Target Date 10/02/20             PT Long Term Goals - 09/26/20 1608      PT LONG TERM GOAL #1   Title Pt. will demonstrate independence with ongoing HEP for continuing improvements after DC    Status On-going      PT LONG TERM GOAL #2   Title Pt. will improve B Shoulder strength to >/= 4+/5    Status On-going      PT LONG TERM GOAL #3   Title Pt. will demonstrate L shoulder ROM to [facility] so she can return to using her L arm as much as her right    Status On-going      PT LONG TERM GOAL #4   Title Pt. will demonstrate corrected posture for FHP and Rounded shoulders to decrease muscle tension on scapular stabilizers    Status On-going                 Plan - 10/02/20 1455    Clinical Impression Statement Julietta reports her L shoulder pain comes and goes with certain positions. She has been doing her HEP but avoided it yesterday due to pain. She feels her shoulder is improving and is looking for advancing her HEP. She presents with increased ROM in her L shoulder and almost has achieved STG #2 and has achieved STG #1. Exercises were progressed for posture and scapular stabilization and cuing was required. These need to be reassessed during her next visit and more visits need to be scheduled. She will continue to benefit from skilled PT to increase L shoulder ROM and strength for improvements in function.    Personal Factors and Comorbidities Comorbidity 3+;Age    Comorbidities Osteoporsis, Arthritis, DM Type2, GERD  PT Frequency 2x / week    PT Duration 6 weeks    PT Treatment/Interventions ADLs/Self Care Home Management;Cryotherapy;Electrical  Stimulation;Iontophoresis 22m/ml Dexamethasone;Moist Heat;Ultrasound;Fluidtherapy;Neuromuscular re-education;Therapeutic exercise;Therapeutic activities;Patient/family education;Manual techniques;Taping;Passive range of motion;Dry needling;Vasopneumatic Device;Joint Manipulations    PT Next Visit Plan Assess HEP understanding; STM as indicated; Progress RC strengthening with isometrics; AAROM; Scapular stabilizer strengthening; Stretches to address posture    PT Home Exercise Plan [email or web address]    Consulted and Agree with Plan of Care Patient           Patient will benefit from skilled therapeutic intervention in order to improve the following deficits and impairments:  Decreased endurance,Hypomobility,Increased muscle spasms,Decreased range of motion,Impaired tone,Improper body mechanics,Decreased activity tolerance,Decreased strength,Increased fascial restricitons,Impaired flexibility,Impaired UE functional use,Postural dysfunction,Pain  Visit Diagnosis: Chronic left shoulder pain  Stiffness of left shoulder, not elsewhere classified  Abnormal posture  Muscle weakness (generalized)     Problem List Patient Active Problem List   Diagnosis Date Noted  . Osteoporosis 12/09/2017  . Syncope 07/17/2017  . Shortness of breath 07/17/2017  . Hyponatremia 03/05/2017  . Lichen simplex chronicus 03/05/2017  . Type 2 diabetes mellitus without complication, without long-term current use of insulin ([place] 03/05/2017  . Perianal lesion 01/31/2017  . Bilateral carotid artery stenosis 12/12/2016  . Restless legs 12/11/2016  . History of gastric bypass 12/11/2016  . PTSD (post-traumatic stress disorder) 12/11/2016  . Risk for falls 12/10/2016  . Other dysphagia 08/19/2016  . Right foot drop 08/19/2016  . Chronic vulvitis 12/25/2015  . Microcalcification of left breast on mammogram 05/11/2015  . Tension type headache 06/14/2013  . Essential tremor 04/13/2013  . Hypersomnia 04/13/2013    SNewman NickelsSPT 10/02/2020, 3:03 PM  [facility] [place] [address]Phone: [phone]  Fax:  [phone] Name: SNickole AdamekMRN: [id number] of Birth: 212/14/1947

## 2020-10-06 ENCOUNTER — Ambulatory Visit: Payer: Medicare Other

## 2020-10-06 ENCOUNTER — Other Ambulatory Visit: Payer: Self-pay

## 2020-10-06 DIAGNOSIS — G8929 Other chronic pain: Secondary | ICD-10-CM

## 2020-10-06 DIAGNOSIS — R293 Abnormal posture: Secondary | ICD-10-CM

## 2020-10-06 DIAGNOSIS — M25512 Pain in left shoulder: Secondary | ICD-10-CM | POA: Diagnosis not present

## 2020-10-06 DIAGNOSIS — M6281 Muscle weakness (generalized): Secondary | ICD-10-CM

## 2020-10-06 DIAGNOSIS — M25612 Stiffness of left shoulder, not elsewhere classified: Secondary | ICD-10-CM | POA: Diagnosis not present

## 2020-10-06 NOTE — Therapy (Signed)
[facility] [address]  [place], [place], [address] Phone: [phone]   Fax:  [phone]  Physical Therapy Treatment  Patient Details  Name: Alexis [name] MRN: [id number] Date of Birth: August 22, 1945 Referring Provider (PT): Lamar Blinks, MD   Encounter Date: 10/06/2020   PT End of Session - 10/06/20 1114    Visit Number 4    Number of Visits 12    Date for PT Re-Evaluation 10/23/20    Authorization Type Medicare & Cign[employer] Supplement VL: Follows Medicare guidelines    PT Start Time 1019    PT Stop Time 1104    PT Time Calculation (min) 45 min    Activity Tolerance Patient tolerated treatment well    Behavior During Therapy [facility] for tasks assessed/performed           Past Medical History:  Diagnosis Date  . Allergy   . Anemia   . Arthritis   . Asthma   . Back pain   . Barrett's esophagus   . Bladder infection   . Cataract   . Depression   . Diabetes mellitus, type II ([place])   . Gallstones   . Gastritis   . GERD (gastroesophageal reflux disease)   . Hyperlipidemia   . Memory deficits   . Osteoporosis 12/09/2017  . PTSD (post-traumatic stress disorder)   . Sleep apnea   . Thyroid disease    Patient denies    Past Surgical History:  Procedure Laterality Date  . CHOLECYSTECTOMY  1999  . COLONOSCOPY    . ESOPHAGOGASTRODUODENOSCOPY    . GANGLION CYST EXCISION Left   . GASTRIC BYPASS  2000  . TONSILLECTOMY AND ADENOIDECTOMY     age 75    There were no vitals filed for this visit.   Subjective Assessment - 10/06/20 1021    Subjective L shoulder is a little sore from exercises.    Patient Stated Goals Releive pain in arm so she can use it again and not rely on the right only.    Currently in Pain? Yes    Pain Score 8     Pain Location Shoulder    Pain Orientation Left    Pain Descriptors / Indicators Sharp    Pain Type Chronic pain                             [facility]  Adult PT Treatment/Exercise - 10/06/20 0001      Exercises   Exercises Shoulder;Neck      Neck Exercises: Machines for Strengthening   UBE (Upper Arm Bike) L1 x 3 forward 3 backwards      Neck Exercises: Seated   Neck Retraction 10 reps;3 secs      Shoulder Exercises: Supine   External Rotation AAROM;Left;10 reps    External Rotation Limitations with wand; towel under elbow    Flexion AAROM;Both;10 reps    Flexion Limitations with wand; 10 reps with 1# weight    ABduction AAROM;Left;10 reps    ABduction Limitations with wand      Shoulder Exercises: Standing   Extension Strengthening;Both;10 reps;Theraband    Theraband Level (Shoulder Extension) Level 1 (Yellow)    Row Strengthening;Both;10 reps;Theraband    Theraband Level (Shoulder Row) Level 1 (Yellow)    Row Limitations cues to keep shoulders depressed      Shoulder Exercises: ROM/Strengthening   Wall Wash flexion with pillow case 10 reps  Manual Therapy   Manual Therapy Soft tissue mobilization;Passive ROM;Myofascial release    Soft tissue mobilization L proximal arm    Myofascial Release L anterolateral deltoid    Passive ROM L shoulder all motions, pain free occasional catching with abduction                  PT Education - 10/06/20 1113    Education Details HEP update: Access Code: [email or web address]    Person(s) Educated Patient    Methods Explanation;Demonstration;Verbal cues;Handout    Comprehension Verbalized understanding;Returned demonstration;Verbal cues required            PT Short Term Goals - 10/02/20 1501      PT SHORT TERM GOAL #1   Title Pt. will be independent with initial HEP    Status Achieved    Target Date 09/25/20      PT SHORT TERM GOAL #2   Title Pt. will increase shoulder ABD by >/=10 degrees to improve functional movement    Status Partially Met   L shoulder ABD increased by 8 degrees   Target Date 10/02/20             PT Long Term Goals - 09/26/20 1608      PT LONG  TERM GOAL #1   Title Pt. will demonstrate independence with ongoing HEP for continuing improvements after DC    Status On-going      PT LONG TERM GOAL #2   Title Pt. will improve B Shoulder strength to >/= 4+/5    Status On-going      PT LONG TERM GOAL #3   Title Pt. will demonstrate L shoulder ROM to [facility] so she can return to using her L arm as much as her right    Status On-going      PT LONG TERM GOAL #4   Title Pt. will demonstrate corrected posture for FHP and Rounded shoulders to decrease muscle tension on scapular stabilizers    Status On-going                 Plan - 10/06/20 1114    Clinical Impression Statement Pt came in with reports of soreness in her L anterolateral shoulderfrom doing exercises. Did some light STM to the shoulder with PROM, to pain free end range. She had occasional catching in the shoulder during scaption avoided abduction to reduce stress on ant shoulder capsule. She shows good demonstration during the supine AAROM exercises, was able to progress her to 1# weight with isolated L shoulder flexion. Also provided cues to prevent scapula elevation during the rows w/ theraband but other than that pt showed good understanding of exercises. Pt responded well to treatment.    Personal Factors and Comorbidities Comorbidity 3+;Age    Comorbidities Osteoporsis, Arthritis, DM Type2, GERD    PT Frequency 2x / week    PT Duration 6 weeks    PT Treatment/Interventions ADLs/Self Care Home Management;Cryotherapy;Electrical Stimulation;Iontophoresis 17m/ml Dexamethasone;Moist Heat;Ultrasound;Fluidtherapy;Neuromuscular re-education;Therapeutic exercise;Therapeutic activities;Patient/family education;Manual techniques;Taping;Passive range of motion;Dry needling;Vasopneumatic Device;Joint Manipulations    PT Next Visit Plan Assess HEP understanding; STM as indicated; Progress RC strengthening with isometrics; AAROM; Scapular stabilizer strengthening; Stretches to address  posture    PT Home Exercise Plan [email or web address], (4/22) Access Code: [email or web address]   Consulted and Agree with Plan of Care Patient           Patient will benefit from skilled therapeutic intervention in order to improve the following deficits and impairments:  Decreased endurance,Hypomobility,Increased muscle  spasms,Decreased range of motion,Impaired tone,Improper body mechanics,Decreased activity tolerance,Decreased strength,Increased fascial restricitons,Impaired flexibility,Impaired UE functional use,Postural dysfunction,Pain  Visit Diagnosis: Chronic left shoulder pain  Stiffness of left shoulder, not elsewhere classified  Abnormal posture  Muscle weakness (generalized)     Problem List Patient Active Problem List   Diagnosis Date Noted  . Osteoporosis 12/09/2017  . Syncope 07/17/2017  . Shortness of breath 07/17/2017  . Hyponatremia 03/05/2017  . Lichen simplex chronicus 03/05/2017  . Type 2 diabetes mellitus without complication, without long-term current use of insulin ([place]) 03/05/2017  . Perianal lesion 01/31/2017  . Bilateral carotid artery stenosis 12/12/2016  . Restless legs 12/11/2016  . History of gastric bypass 12/11/2016  . PTSD (post-traumatic stress disorder) 12/11/2016  . Risk for falls 12/10/2016  . Other dysphagia 08/19/2016  . Right foot drop 08/19/2016  . Chronic vulvitis 12/25/2015  . Microcalcification of left breast on mammogram 05/11/2015  . Tension type headache 06/14/2013  . Essential tremor 04/13/2013  . Hypersomnia 04/13/2013    Artist Pais, PTA 10/06/2020, 11:30 AM  [facility] [address]  [place], [place], [address] Phone: [phone]   Fax:  [phone]  Name: Alexis [name] MRN: [id number] Date of Birth: Jan 23, 1946

## 2020-10-12 NOTE — Telephone Encounter (Signed)
I have forwarded this concern on to [employer] for someone to contact the patient regarding this.

## 2020-10-18 DIAGNOSIS — F32A Depression, unspecified: Secondary | ICD-10-CM | POA: Insufficient documentation

## 2020-10-18 DIAGNOSIS — E871 Hypo-osmolality and hyponatremia: Secondary | ICD-10-CM | POA: Diagnosis not present

## 2020-10-18 DIAGNOSIS — R309 Painful micturition, unspecified: Secondary | ICD-10-CM | POA: Diagnosis not present

## 2020-10-18 DIAGNOSIS — N189 Chronic kidney disease, unspecified: Secondary | ICD-10-CM | POA: Diagnosis not present

## 2020-10-18 DIAGNOSIS — E559 Vitamin D deficiency, unspecified: Secondary | ICD-10-CM | POA: Insufficient documentation

## 2020-10-18 DIAGNOSIS — M109 Gout, unspecified: Secondary | ICD-10-CM | POA: Diagnosis not present

## 2020-10-18 DIAGNOSIS — K219 Gastro-esophageal reflux disease without esophagitis: Secondary | ICD-10-CM | POA: Insufficient documentation

## 2020-10-18 DIAGNOSIS — D649 Anemia, unspecified: Secondary | ICD-10-CM | POA: Diagnosis not present

## 2020-10-18 DIAGNOSIS — E119 Type 2 diabetes mellitus without complications: Secondary | ICD-10-CM | POA: Diagnosis not present

## 2020-10-18 DIAGNOSIS — I6523 Occlusion and stenosis of bilateral carotid arteries: Secondary | ICD-10-CM | POA: Insufficient documentation

## 2020-10-19 ENCOUNTER — Other Ambulatory Visit: Payer: Self-pay | Admitting: Family Medicine

## 2020-10-23 ENCOUNTER — Ambulatory Visit: Payer: Medicare Other | Attending: Family Medicine

## 2020-10-23 ENCOUNTER — Other Ambulatory Visit: Payer: Self-pay

## 2020-10-23 DIAGNOSIS — M25512 Pain in left shoulder: Secondary | ICD-10-CM | POA: Insufficient documentation

## 2020-10-23 DIAGNOSIS — M25612 Stiffness of left shoulder, not elsewhere classified: Secondary | ICD-10-CM

## 2020-10-23 DIAGNOSIS — M6281 Muscle weakness (generalized): Secondary | ICD-10-CM | POA: Diagnosis not present

## 2020-10-23 DIAGNOSIS — R293 Abnormal posture: Secondary | ICD-10-CM | POA: Diagnosis not present

## 2020-10-23 DIAGNOSIS — G8929 Other chronic pain: Secondary | ICD-10-CM | POA: Diagnosis not present

## 2020-10-23 NOTE — Therapy (Deleted)
[facility] [address]  [place], [place], [address] Phone: [phone]   Fax:  [phone]  Physical Therapy Treatment/ Recert  Patient Details  Name: Alexis [name] MRN: [id number] Date of Birth: [birth year] Referring Provider (PT): Lamar Blinks, MD   Encounter Date: 10/23/2020   PT End of Session - 10/23/20 1551    Visit Number 5    Number of Visits 12    Date for PT Re-Evaluation 10/23/20    Authorization Type Medicare & Cign[employer] Supplement VL: Follows Medicare guidelines    PT Start Time 1450    PT Stop Time 1527    PT Time Calculation (min) 37 min    Activity Tolerance Patient tolerated treatment well    Behavior During Therapy [facility] for tasks assessed/performed           Past Medical History:  Diagnosis Date  . Allergy   . Anemia   . Arthritis   . Asthma   . Back pain   . Barrett's esophagus   . Bladder infection   . Cataract   . Depression   . Diabetes mellitus, type II ([place])   . Gallstones   . Gastritis   . GERD (gastroesophageal reflux disease)   . Hyperlipidemia   . Memory deficits   . Osteoporosis 12/09/2017  . PTSD (post-traumatic stress disorder)   . Sleep apnea   . Thyroid disease    Patient denies    Past Surgical History:  Procedure Laterality Date  . CHOLECYSTECTOMY  1999  . COLONOSCOPY    . ESOPHAGOGASTRODUODENOSCOPY    . GANGLION CYST EXCISION Left   . GASTRIC BYPASS  2000  . TONSILLECTOMY AND ADENOIDECTOMY     age 75    There were no vitals filed for this visit.   Subjective Assessment - 10/23/20 1456    Subjective Pt reports she was trying to replace the kitten litter the other day, reached for the phone and fell. Feels better with GI issues.    Patient Stated Goals Releive pain in arm so she can use it again and not rely on the right only.    Currently in Pain? Yes    Pain Score 4     Pain Location Shoulder    Pain Orientation Left    Pain Descriptors /  Indicators Sharp    Pain Type Chronic pain              [facility] PT Assessment - 10/23/20 0001      Assessment   Medical Diagnosis L RC Tendonitis    Referring Provider (PT) Lamar Blinks, MD    Onset Date/Surgical Date --   3 months ago     AROM   Right Shoulder Flexion 111 Degrees   sitting; 145 supine   Right Shoulder ABduction 118 Degrees   sitting; 150 supine     Strength   Right Shoulder Flexion 4/5    Right Shoulder ABduction 4+/5    Left Shoulder Flexion 4/5    Left Shoulder ABduction 4/5   pain                                  PT Short Term Goals - 10/23/20 1459      PT SHORT TERM GOAL #1   Title Pt. will be independent with initial HEP    Status Achieved    Target Date 09/25/20  PT SHORT TERM GOAL #2   Title Pt. will increase shoulder ABD by >/=10 degrees to improve functional movement    Status Partially Met   L shoulder ABD increased by 8 degrees   Target Date 10/02/20             PT Long Term Goals - 10/23/20 1503      PT LONG TERM GOAL #1   Title Pt. will demonstrate independence with ongoing HEP for continuing improvements after DC    Status Partially Met      PT LONG TERM GOAL #2   Title Pt. will improve B Shoulder strength to >/= 4+/5    Status On-going   L shld mostly 4/5     PT LONG TERM GOAL #3   Title Pt. will demonstrate L shoulder ROM to [facility] so she can return to using her L arm as much as her right    Status On-going   L shld flex and abd needs improvement; "does not use her L shld for anything"     PT LONG TERM GOAL #4   Title Pt. will demonstrate corrected posture for FHP and Rounded shoulders to decrease muscle tension on scapular stabilizers    Status Partially Met   can self correct posture but still has rounding at shoulders                Plan - 10/23/20 1552    Clinical Impression Statement Pt has missed her past few appointments d/t stomach issues, also reported having a fall the other  day while trying to change kitten litter. She has made slight improvements in goals. Her B shoulder strength could use improvement along with L shoulder ABD and flexion AROM. She reports that she does not use her L shoulder at all for functional activities but advised her to start strying to use it for certain things to help with ROM but not pushing into pain. She is able to recall her HEP but needs cues to correcty perform the exercises. Her posture looks ok, she can self correct but still needs more direction in order to keep her shoulders from rounding. She would benefit from continued PT in order to address deficits in L shoulder strength, AROM, and functional mobility.    Personal Factors and Comorbidities Comorbidity 3+;Age    Comorbidities Osteoporsis, Arthritis, DM Type2, GERD    PT Frequency 2x / week    PT Duration 6 weeks    PT Treatment/Interventions ADLs/Self Care Home Management;Cryotherapy;Electrical Stimulation;Iontophoresis 3m/ml Dexamethasone;Moist Heat;Ultrasound;Fluidtherapy;Neuromuscular re-education;Therapeutic exercise;Therapeutic activities;Patient/family education;Manual techniques;Taping;Passive range of motion;Dry needling;Vasopneumatic Device;Joint Manipulations    PT Next Visit Plan Assess HEP understanding; STM as indicated; Progress RC strengthening with isometrics; AAROM; Scapular stabilizer strengthening; Stretches to address posture    PT Home Exercise Plan [email or web address], (4/22) Access Code: [email or web address]   Consulted and Agree with Plan of Care Patient           Patient will benefit from skilled therapeutic intervention in order to improve the following deficits and impairments:  Decreased endurance,Hypomobility,Increased muscle spasms,Decreased range of motion,Impaired tone,Improper body mechanics,Decreased activity tolerance,Decreased strength,Increased fascial restricitons,Impaired flexibility,Impaired UE functional use,Postural dysfunction,Pain  Visit Diagnosis: Chronic  left shoulder pain  Stiffness of left shoulder, not elsewhere classified  Abnormal posture  Muscle weakness (generalized)     Problem List Patient Active Problem List   Diagnosis Date Noted  . Osteoporosis 12/09/2017  . Syncope 07/17/2017  . Shortness of breath 07/17/2017  . Hyponatremia 03/05/2017  .  Lichen simplex chronicus 03/05/2017  . Type 2 diabetes mellitus without complication, without long-term current use of insulin ([place]) 03/05/2017  . Perianal lesion 01/31/2017  . Bilateral carotid artery stenosis 12/12/2016  . Restless legs 12/11/2016  . History of gastric bypass 12/11/2016  . PTSD (post-traumatic stress disorder) 12/11/2016  . Risk for falls 12/10/2016  . Other dysphagia 08/19/2016  . Right foot drop 08/19/2016  . Chronic vulvitis 12/25/2015  . Microcalcification of left breast on mammogram 05/11/2015  . Tension type headache 06/14/2013  . Essential tremor 04/13/2013  . Hypersomnia 04/13/2013    Artist Pais, PTA 10/23/2020, 4:02 PM  [facility] [address]  [place], [place], [address] Phone: [phone]   Fax:  [phone]  Name: Alexis [name] MRN: [id number] Date of Birth: 06-10-1946

## 2020-10-23 NOTE — Therapy (Deleted)
[facility] [address]  [place], [place], [address] Phone: [phone]   Fax:  [phone]  Physical Therapy Treatment  Patient Details  Name: Alexis [name] MRN: [id number] Date of Birth: 1945-08-19 Referring Provider (PT): Lamar Blinks, MD   Encounter Date: 10/23/2020   PT End of Session - 10/23/20 1551    Visit Number 5    Number of Visits 12    Date for PT Re-Evaluation 10/23/20    Authorization Type Medicare & Cign[employer] Supplement VL: Follows Medicare guidelines    PT Start Time 1450    PT Stop Time 1527    PT Time Calculation (min) 37 min    Activity Tolerance Patient tolerated treatment well    Behavior During Therapy [facility] for tasks assessed/performed           Past Medical History:  Diagnosis Date  . Allergy   . Anemia   . Arthritis   . Asthma   . Back pain   . Barrett's esophagus   . Bladder infection   . Cataract   . Depression   . Diabetes mellitus, type II ([place])   . Gallstones   . Gastritis   . GERD (gastroesophageal reflux disease)   . Hyperlipidemia   . Memory deficits   . Osteoporosis 12/09/2017  . PTSD (post-traumatic stress disorder)   . Sleep apnea   . Thyroid disease    Patient denies    Past Surgical History:  Procedure Laterality Date  . CHOLECYSTECTOMY  1999  . COLONOSCOPY    . ESOPHAGOGASTRODUODENOSCOPY    . GANGLION CYST EXCISION Left   . GASTRIC BYPASS  2000  . TONSILLECTOMY AND ADENOIDECTOMY     age 75    There were no vitals filed for this visit.   Subjective Assessment - 10/23/20 1456    Subjective Pt reports she was trying to replace the kitten litter the other day, reached for the phone and fell. Feels better with GI issues.    Patient Stated Goals Releive pain in arm so she can use it again and not rely on the right only.    Currently in Pain? Yes    Pain Score 4     Pain Location Shoulder    Pain Orientation Left    Pain Descriptors / Indicators Sharp     Pain Type Chronic pain              [facility] PT Assessment - 10/23/20 0001      Assessment   Medical Diagnosis L RC Tendonitis    Referring Provider (PT) Lamar Blinks, MD    Onset Date/Surgical Date --   3 months ago     AROM   Right Shoulder Flexion --    Right Shoulder ABduction --    Left Shoulder Flexion --   111 seated: 145 supine   Left Shoulder ABduction --   118 seated; 150 supine     Strength   Right Shoulder Flexion 4/5    Right Shoulder ABduction 4+/5    Left Shoulder Flexion 4/5    Left Shoulder ABduction 4/5   pain                                  PT Short Term Goals - 10/23/20 1459      PT SHORT TERM GOAL #1   Title Pt. will be independent with initial  HEP    Status Achieved    Target Date 09/25/20      PT SHORT TERM GOAL #2   Title Pt. will increase shoulder ABD by >/=10 degrees to improve functional movement    Status Partially Met   L shoulder ABD increased by 8 degrees   Target Date 10/02/20             PT Long Term Goals - 10/23/20 1503      PT LONG TERM GOAL #1   Title Pt. will demonstrate independence with ongoing HEP for continuing improvements after DC    Status Partially Met      PT LONG TERM GOAL #2   Title Pt. will improve B Shoulder strength to >/= 4+/5    Status On-going   L shld mostly 4/5     PT LONG TERM GOAL #3   Title Pt. will demonstrate L shoulder ROM to [facility] so she can return to using her L arm as much as her right    Status On-going   L shld flex and abd needs improvement; "does not use her L shld for anything"     PT LONG TERM GOAL #4   Title Pt. will demonstrate corrected posture for FHP and Rounded shoulders to decrease muscle tension on scapular stabilizers    Status Partially Met   can self correct posture but still has rounding at shoulders                Plan - 10/23/20 1552    Clinical Impression Statement Pt has missed her past few appointments d/t stomach issues, also  reported having a fall the other day while trying to change kitten litter. She has made slight improvements in goals. Her B shoulder strength could use improvement along with L shoulder ABD and flexion AROM. She reports that she does not use her L shoulder at all for functional activities but advised her to start strying to use it for certain things to help with ROM but not pushing into pain. She is able to recall her HEP but needs cues to correcty perform the exercises. Her posture looks ok, she can self correct but still needs more direction in order to keep her shoulders from rounding. She would benefit from continued PT in order to address deficits in L shoulder strength, AROM, and functional mobility.    Personal Factors and Comorbidities Comorbidity 3+;Age    Comorbidities Osteoporsis, Arthritis, DM Type2, GERD    PT Frequency 2x / week    PT Duration 6 weeks    PT Treatment/Interventions ADLs/Self Care Home Management;Cryotherapy;Electrical Stimulation;Iontophoresis 25m/ml Dexamethasone;Moist Heat;Ultrasound;Fluidtherapy;Neuromuscular re-education;Therapeutic exercise;Therapeutic activities;Patient/family education;Manual techniques;Taping;Passive range of motion;Dry needling;Vasopneumatic Device;Joint Manipulations    PT Next Visit Plan Assess HEP understanding; STM as indicated; Progress RC strengthening with isometrics; AAROM; Scapular stabilizer strengthening; Stretches to address posture    PT Home Exercise Plan [email or web address], (4/22) Access Code: [email or web address]   Consulted and Agree with Plan of Care Patient           Patient will benefit from skilled therapeutic intervention in order to improve the following deficits and impairments:  Decreased endurance,Hypomobility,Increased muscle spasms,Decreased range of motion,Impaired tone,Improper body mechanics,Decreased activity tolerance,Decreased strength,Increased fascial restricitons,Impaired flexibility,Impaired UE functional use,Postural  dysfunction,Pain  Visit Diagnosis: Chronic left shoulder pain  Stiffness of left shoulder, not elsewhere classified  Abnormal posture  Muscle weakness (generalized)     Problem List Patient Active Problem List   Diagnosis Date Noted  . Osteoporosis  12/09/2017  . Syncope 07/17/2017  . Shortness of breath 07/17/2017  . Hyponatremia 03/05/2017  . Lichen simplex chronicus 03/05/2017  . Type 2 diabetes mellitus without complication, without long-term current use of insulin ([place]) 03/05/2017  . Perianal lesion 01/31/2017  . Bilateral carotid artery stenosis 12/12/2016  . Restless legs 12/11/2016  . History of gastric bypass 12/11/2016  . PTSD (post-traumatic stress disorder) 12/11/2016  . Risk for falls 12/10/2016  . Other dysphagia 08/19/2016  . Right foot drop 08/19/2016  . Chronic vulvitis 12/25/2015  . Microcalcification of left breast on mammogram 05/11/2015  . Tension type headache 06/14/2013  . Essential tremor 04/13/2013  . Hypersomnia 04/13/2013    Artist Pais, PTA 10/23/2020, 6:18 PM  [facility] [address]  [place], [place], [address] Phone: [phone]   Fax:  [phone]  Name: Alexis [name] MRN: [id number] Date of Birth: 1946/03/27

## 2020-10-23 NOTE — Therapy (Addendum)
[facility] [address]  [place], [place], [address] Phone: [phone]   Fax:  [phone]  Physical Therapy Treatment / Recert / Discharge Summary    Patient Details  Name: Alexis [name] MRN: [id number] Date of Birth: 04/17/1946 Referring Provider (PT): Lamar Blinks, MD  Progress Note  Reporting Period 09/11/2020 to 10/23/2020  See note below for Objective Data and Assessment of Progress/Goals.      Encounter Date: 10/23/2020   PT End of Session - 10/23/20 1551     Visit Number 5    Number of Visits 17    Date for PT Re-Evaluation 12/04/20    Authorization Type Medicare & Cign[employer] Supplement VL: Follows Medicare guidelines    PT Start Time 1450    PT Stop Time 1527    PT Time Calculation (min) 37 min    Activity Tolerance Patient tolerated treatment well    Behavior During Therapy WFL for tasks assessed/performed             Past Medical History:  Diagnosis Date   Allergy    Anemia    Arthritis    Asthma    Back pain    Barrett's esophagus    Bladder infection    Cataract    Depression    Diabetes mellitus, type II ([place])    Gallstones    Gastritis    GERD (gastroesophageal reflux disease)    Hyperlipidemia    Memory deficits    Osteoporosis 12/09/2017   PTSD (post-traumatic stress disorder)    Sleep apnea    Thyroid disease    Patient denies    Past Surgical History:  Procedure Laterality Date   CHOLECYSTECTOMY  1999   COLONOSCOPY     ESOPHAGOGASTRODUODENOSCOPY     GANGLION CYST EXCISION Left    GASTRIC BYPASS  2000   TONSILLECTOMY AND ADENOIDECTOMY     age 75    There were no vitals filed for this visit.   Subjective Assessment - 10/23/20 1456     Subjective Pt reports she was trying to replace the kitten litter the other day, reached for the phone and fell. Feels better with GI issues.    Patient Stated Goals Releive pain in arm so she can use it again and not rely on the  right only.    Currently in Pain? Yes    Pain Score 4     Pain Location Shoulder    Pain Orientation Left    Pain Descriptors / Indicators Sharp    Pain Type Chronic pain                [facility] PT Assessment - 10/23/20 0001       Assessment   Medical Diagnosis L RC Tendonitis    Referring Provider (PT) Lamar Blinks, MD    Onset Date/Surgical Date --   3 months ago     AROM   Right Shoulder Flexion --    Right Shoulder ABduction --    Left Shoulder Flexion --   111 seated: 145 supine   Left Shoulder ABduction --   118 seated; 150 supine     Strength   Right Shoulder Flexion 4/5    Right Shoulder ABduction 4+/5    Left Shoulder Flexion 4/5    Left Shoulder ABduction 4/5   pain  PT Short Term Goals - 10/23/20 1459       PT SHORT TERM GOAL #1   Title Pt. will be independent with initial HEP    Status Achieved    Target Date 09/25/20      PT SHORT TERM GOAL #2   Title Pt. will increase shoulder ABD by >/=10 degrees to improve functional movement    Status Achieved   L shoulder ABD increased by 13 degrees   Target Date 10/02/20               PT Long Term Goals - 10/23/20 1503       PT LONG TERM GOAL #1   Title Pt. will demonstrate independence with ongoing HEP for continuing improvements after DC    Status Partially Met    Target Date 12/04/20      PT LONG TERM GOAL #2   Title Pt. will improve B Shoulder strength to >/= 4+/5    Status On-going   L shld mostly 4/5   Target Date 12/04/20      PT LONG TERM GOAL #3   Title Pt. will demonstrate L shoulder ROM to [facility] so she can return to using her L arm as much as her right    Status On-going   L shld flex and abd needs improvement; "does not use her L shld for anything"   Target Date 12/04/20      PT LONG TERM GOAL #4   Title Pt. will demonstrate corrected posture for FHP and Rounded shoulders to decrease muscle tension on scapular  stabilizers    Status Partially Met   can self correct posture but still has rounding at shoulders   Target Date 12/04/20                   Plan - 10/23/20 1552     Clinical Impression Statement Pt has missed her past few appointments d/t stomach issues, also reported having a fall the other day while trying to change kitten litter. She has made slight improvements in goals. Her B shoulder strength could use improvement along with L shoulder ABD and flexion AROM. She reports that she does not use her L shoulder at all for functional activities but advised her to start trying to use it for certain OH movements to help with ROM but not pushing into pain. Flexion AROM has declined, maybe d/t the statement above. She is able to recall her HEP but needs cues to correcty perform the exercises. Her posture looks ok, she can self correct but still needs more direction in order to keep her shoulders from rounding. She would benefit from continued PT in order to address deficits in L shoulder strength, AROM, and functional mobility.    Personal Factors and Comorbidities Comorbidity 3+;Age    Comorbidities Osteoporsis, Arthritis, DM Type2, GERD    PT Frequency 2x / week    PT Duration 6 weeks    PT Treatment/Interventions ADLs/Self Care Home Management;Cryotherapy;Electrical Stimulation;Iontophoresis 90m/ml Dexamethasone;Moist Heat;Ultrasound;Fluidtherapy;Neuromuscular re-education;Therapeutic exercise;Therapeutic activities;Patient/family education;Manual techniques;Taping;Passive range of motion;Dry needling;Vasopneumatic Device;Joint Manipulations    PT Next Visit Plan Assess HEP understanding; STM as indicated; Progress RC strengthening with isometrics; AAROM; Scapular stabilizer strengthening; Stretches to address posture    PT [place] (4/22) Access Code: [email or web address]   Consulted and Agree with Plan of Care Patient             Patient will benefit from skilled therapeutic  intervention in order  to improve the following deficits and impairments:  Decreased endurance,Hypomobility,Increased muscle spasms,Decreased range of motion,Impaired tone,Improper body mechanics,Decreased activity tolerance,Decreased strength,Increased fascial restricitons,Impaired flexibility,Impaired UE functional use,Postural dysfunction,Pain  Visit Diagnosis: Chronic left shoulder pain  Stiffness of left shoulder, not elsewhere classified  Abnormal posture  Muscle weakness (generalized)     Problem List Patient Active Problem List   Diagnosis Date Noted   Osteoporosis 12/09/2017   Syncope 07/17/2017   Shortness of breath 07/17/2017   Hyponatremia 02/72/5366   Lichen simplex chronicus 03/05/2017   Type 2 diabetes mellitus without complication, without long-term current use of insulin ([place]) 03/05/2017   Perianal lesion 01/31/2017   Bilateral carotid artery stenosis 12/12/2016   Restless legs 12/11/2016   History of gastric bypass 12/11/2016   PTSD (post-traumatic stress disorder) 12/11/2016   Risk for falls 12/10/2016   Other dysphagia 08/19/2016   Right foot drop 08/19/2016   Chronic vulvitis 12/25/2015   Microcalcification of left breast on mammogram 05/11/2015   Tension type headache 06/14/2013   Essential tremor 04/13/2013   Hypersomnia 04/13/2013    Artist Pais, PTA 10/23/2020, 6:52 PM  [facility] [address]  [place], [place], [address] Phone: [phone]   Fax:  [phone]  Name: Alexis [name] MRN: [id number] Date of Birth: Jun 09, 1946   Elaya progress has been slower than anticipated due to several missed visits secondary to GI issues/illness and failure to schedule visits (pt has only completed 5 of the anticipated 12 visits). Her L shoulder abduction ROM has improved, and flexion had previously also improved although current measurements indicating a decline in flexion ROM. Mild  weakness exists in available ROM and pt admits to limited attempts at active use of L shoulder with normal daily activities due to pain and R hand dominance. STGs goal met but LTGs only partially met or ongoing. Alexis [name] has good potential to further benefit from skilled PT to improve posture, L shoulder ROM and strength to improve functional use of L UE with daily activities, therefore will recommend recert for additional 2x/wk for up to 4-6 weeks.  Percival Spanish, PT, MPT 10/23/20, 7:05 PM  [facility] [address]  [place], [place], [address] Phone: [phone]   Fax:  [phone]   PHYSICAL THERAPY DISCHARGE SUMMARY  Visits from Start of Care: 5  Current functional level related to goals / functional outcomes:   Refer to above clinical impression for status as of last visit on 10/23/2020. Patient cancelled all further appointments stating due to the price of gas and food she's no longer able to come as she is on a fixed income, therefore will proceed with discharge from PT for this episode.   Remaining deficits:   As above. Patient did not return since recert.   Education / Equipment:   HEP   Patient agrees to discharge. Patient goals were partially met. Patient is being discharged due to financial reasons.   Percival Spanish, PT, MPT 12/22/20, 10:59 AM  [facility] [place], [place], [address] Phone: [phone]   Fax:  [phone]

## 2020-10-23 NOTE — Addendum Note (Signed)
Addended by: Marry Guan on: 10/23/2020 07:10 PM   Modules accepted: Orders

## 2020-10-27 ENCOUNTER — Encounter: Payer: Medicare Other | Admitting: Physical Therapy

## 2020-11-23 ENCOUNTER — Telehealth (INDEPENDENT_AMBULATORY_CARE_PROVIDER_SITE_OTHER): Payer: Medicare Other | Admitting: Psychiatry

## 2020-11-23 ENCOUNTER — Other Ambulatory Visit: Payer: Self-pay

## 2020-11-23 ENCOUNTER — Encounter (HOSPITAL_COMMUNITY): Payer: Self-pay | Admitting: Psychiatry

## 2020-11-23 DIAGNOSIS — F331 Major depressive disorder, recurrent, moderate: Secondary | ICD-10-CM | POA: Diagnosis not present

## 2020-11-23 DIAGNOSIS — F4310 Post-traumatic stress disorder, unspecified: Secondary | ICD-10-CM | POA: Diagnosis not present

## 2020-11-23 MED ORDER — VENLAFAXINE HCL ER 150 MG PO CP24: 150.0000 mg | ORAL_CAPSULE | Freq: Every day | ORAL | 0 refills | Status: DC

## 2020-11-23 MED ORDER — LAMOTRIGINE 100 MG PO TABS: 100.0000 mg | ORAL_TABLET | Freq: Every day | ORAL | 0 refills | Status: DC
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
## 2021-02-08 ENCOUNTER — Ambulatory Visit (INDEPENDENT_AMBULATORY_CARE_PROVIDER_SITE_OTHER): Payer: Medicare Other

## 2021-02-08 ENCOUNTER — Other Ambulatory Visit: Payer: Self-pay

## 2021-02-08 DIAGNOSIS — M810 Age-related osteoporosis without current pathological fracture: Secondary | ICD-10-CM

## 2021-02-08 MED ORDER — DENOSUMAB 60 MG/ML ~~LOC~~ SOSY
60.0000 mg | PREFILLED_SYRINGE | Freq: Once | SUBCUTANEOUS | Status: AC
  Administered 2021-02-08: 60 mg via SUBCUTANEOUS

## 2021-02-08 NOTE — Progress Notes (Signed)
Alexis [name] is a 75 y.o. female presents to the office today for Prolia:  injections, per physician's orders by Dr. Patsy Lager.   Prolia (med), 60mg /ml (dose),  subq (route) was administered left arm (location) today. Patient tolerated injection. Patient due for follow up labs/provider appt: Yes. Date due: she is scheduled to see provider 02-26-21. Patient next injection due: in 6 month, appt made she will be contacted to schedule.   02-02-1979

## 2021-02-22 ENCOUNTER — Encounter (HOSPITAL_COMMUNITY): Payer: Self-pay | Admitting: Psychiatry

## 2021-02-22 ENCOUNTER — Telehealth (INDEPENDENT_AMBULATORY_CARE_PROVIDER_SITE_OTHER): Payer: Medicare Other | Admitting: Psychiatry

## 2021-02-22 ENCOUNTER — Other Ambulatory Visit: Payer: Self-pay

## 2021-02-22 ENCOUNTER — Other Ambulatory Visit: Payer: Self-pay | Admitting: Family Medicine

## 2021-02-22 DIAGNOSIS — F4310 Post-traumatic stress disorder, unspecified: Secondary | ICD-10-CM | POA: Diagnosis not present

## 2021-02-22 DIAGNOSIS — F331 Major depressive disorder, recurrent, moderate: Secondary | ICD-10-CM | POA: Diagnosis not present

## 2021-02-22 MED ORDER — LAMOTRIGINE 100 MG PO TABS: 100.0000 mg | ORAL_TABLET | Freq: Every day | ORAL | 0 refills | Status: DC

## 2021-02-22 MED ORDER — VENLAFAXINE HCL ER 150 MG PO CP24: 150.0000 mg | ORAL_CAPSULE | Freq: Every day | ORAL | 0 refills | Status: DC
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
## 2021-02-23 DIAGNOSIS — E559 Vitamin D deficiency, unspecified: Secondary | ICD-10-CM | POA: Diagnosis not present

## 2021-02-23 DIAGNOSIS — E119 Type 2 diabetes mellitus without complications: Secondary | ICD-10-CM | POA: Diagnosis not present

## 2021-02-23 DIAGNOSIS — E871 Hypo-osmolality and hyponatremia: Secondary | ICD-10-CM | POA: Diagnosis not present

## 2021-02-23 DIAGNOSIS — K219 Gastro-esophageal reflux disease without esophagitis: Secondary | ICD-10-CM | POA: Diagnosis not present

## 2021-02-23 NOTE — Patient Instructions (Addendum)
Good to see you again today- assuming all is well please see me in 6 months  Try adding the carafate for your stomach for about 10 days Continue omeprazole/ Prilosec I will ask Dr Leone Payor from GI to set up an appt with you  Ok to use the diclofenac gel as needed for your knee pain  Flu shot given today I also recommend getting the new covid 19 vaccine when you can

## 2021-02-23 NOTE — Progress Notes (Addendum)
[facility] [address], [address] [place], [place] [address] [phone] [phone]  Date:  02/26/2021   Name:  Alexis [name] Vaughan   DOB:  1945-10-14   MRN:  [id number]  PCP:  Pearline Cablesopland, Clarkson Rosselli C, MD    Chief Complaint: Diabetes   History of Present Illness:  Alexis [name] Pare is a 75 y.o. very pleasant female patient who presents with the following:  Pt seen today for a 6 month follow-up visit  Last seen by myself in March of this year   History of diet-controlled diabetes, gastric bypass, carotid artery stenosis, osteoporosis, benign tremor, depression, foot drop, lichen simplex chronicus  Her psychiatrist is Dr. Lolly MustacheArfeen Her neurologist had noted her unusual speech- she talks with her teeth clenched.  The exact cause is unclear, however this is thought to be benign  She also does have a hand tremor, she is taking primidone for this She continues to have some symptoms of tremor which can be annoying  At our last visit she had lost 6-7 lbs without really trying to.  I asked her to increase her calorie intake and to let me know if this did not result in re-gain of weight  Her weight is up to her baseline today   Urine micro due- will do today  Eye exam- she will do this soon, she does a 6 month follow-up  Covid booster- encouraged her to do this  Foot exam- today  Flu shot- today  Due for A1c   She is using some diclofenac gel for her knees- this is really helping her  She notes epigastric pain for 4-5 months Will come and go- generally lasts for 2-3 days and then will remit for a while Eating makes it better temporarily Very rarely she might vomit She notes some diarrhea off and on Taking tums helps some - she is also taking prilosec BID  She is s/p chole She also notes a lot of gas She does have some burping as well  Last UGI 2019 She reports prior history of ulcers   She saw nephrology last week- I am waiting on this  report  Dois DavenportSandra also notes that she will occasionally take a "mis-step" and possibly stumble over her own feet.  I discussed having her do some physical therapy but she declines at this time.  She is not using a cane Wt Readings from Last 3 Encounters:  02/26/21 146 lb (66.2 kg)  08/31/20 139 lb (63 kg)  08/23/20 139 lb (63 kg)     Patient Active Problem List   Diagnosis Date Noted   Osteoporosis 12/09/2017   Syncope 07/17/2017   Shortness of breath 07/17/2017   Hyponatremia 03/05/2017   Lichen simplex chronicus 03/05/2017   Type 2 diabetes mellitus without complication, without long-term current use of insulin (HCC) 03/05/2017   Perianal lesion 01/31/2017   Bilateral carotid artery stenosis 12/12/2016   Restless legs 12/11/2016   History of gastric bypass 12/11/2016   PTSD (post-traumatic stress disorder) 12/11/2016   Risk for falls 12/10/2016   Other dysphagia 08/19/2016   Right foot drop 08/19/2016   Chronic vulvitis 12/25/2015   Microcalcification of left breast on mammogram 05/11/2015   Tension type headache 06/14/2013   Essential tremor 04/13/2013   Hypersomnia 04/13/2013    Past Medical History:  Diagnosis Date   Allergy    Anemia    Arthritis    Asthma    Back pain  Barrett's esophagus    Bladder infection    Cataract    Depression    Diabetes mellitus, type II (HCC)    Gallstones    Gastritis    GERD (gastroesophageal reflux disease)    Hyperlipidemia    Memory deficits    Osteoporosis 12/09/2017   PTSD (post-traumatic stress disorder)    Sleep apnea    Thyroid disease    Patient denies    Past Surgical History:  Procedure Laterality Date   CHOLECYSTECTOMY  1999   COLONOSCOPY     ESOPHAGOGASTRODUODENOSCOPY     GANGLION CYST EXCISION Left    GASTRIC BYPASS  2000   TONSILLECTOMY AND ADENOIDECTOMY     age 2    Social History   Tobacco Use   Smoking status: Never   Smokeless tobacco: Never  Vaping Use   Vaping Use: Never used  Substance  Use Topics   Alcohol use: No    Alcohol/week: 0.0 standard drinks   Drug use: No    Family History  Problem Relation Age of Onset   Depression Mother    Heart disease Mother    Kidney disease Mother    Alcohol abuse Father    Tremor Father    Prostate cancer Father    Other Father        Hypoglycemia   Heart disease Father    Asthma Father    Diabetes Paternal Grandmother    Other Daughter        stillborn   Colon cancer Neg Hx    Esophageal cancer Neg Hx    Rectal cancer Neg Hx    Stomach cancer Neg Hx    Allergic rhinitis Neg Hx    Eczema Neg Hx    Urticaria Neg Hx    Immunodeficiency Neg Hx    Angioedema Neg Hx     Allergies  Allergen Reactions   Metformin Diarrhea    Medication list has been reviewed and updated.  Current Outpatient Medications on File Prior to Visit  Medication Sig Dispense Refill   aspirin 81 MG EC tablet Take 81 mg by mouth at bedtime. Swallow whole.     b complex vitamins tablet Take 1 tablet by mouth 2 (two) times daily.     BIOTIN 5000 PO Take 1 tablet by mouth 2 (two) times daily.     calcium carbonate (OSCAL) 1500 (600 Ca) MG TABS tablet Take by mouth.     Cholecalciferol (VITAMIN D3) 1.25 MG (50000 UT) CAPS Take 1 weekly for 12 weeks (Patient taking differently: 500 Units in the morning and at bedtime. Take 1 weekly for 12 weeks) 12 capsule 4   clindamycin (CLINDAGEL) 1 % gel Apply topically 2 (two) times daily. 30 g 0   Cyanocobalamin (VITAMIN B12 TR PO) Take 500 mcg by mouth at bedtime.      Ferrous Sulfate (IRON PO) Take 1 tablet by mouth 3 (three) times a week.     fluticasone (FLONASE) 50 MCG/ACT nasal spray SPRAY 2 SPRAYS INTO EACH NOSTRIL EVERY DAY 48 mL 1   gabapentin (NEURONTIN) 300 MG capsule TAKE 1 CAPSULE BY MOUTH EVERY NIGHT 90 capsule 3   lamoTRIgine (LAMICTAL) 100 MG tablet Take 1 tablet (100 mg total) by mouth daily. 90 tablet 0   loperamide (IMODIUM A-D) 2 MG tablet Take 2 mg by mouth as needed.      omeprazole  (PRILOSEC) 40 MG capsule TAKE 1 CAPSULE BY MOUTH TWICE A DAY 180 capsule 3  primidone (MYSOLINE) 50 MG tablet TAKE 2 TABLETS EVERY MORNING TAKE 1 TABLET AT NOON AND TAKE 1 TABLET AT SUPPER 360 tablet 3   triamcinolone (KENALOG) 0.025 % cream Apply to lip twice daily for 10 days. 30 g 0   venlafaxine XR (EFFEXOR-XR) 150 MG 24 hr capsule Take 1 capsule (150 mg total) by mouth daily. 90 capsule 0   acetaminophen (TYLENOL) 500 MG tablet Take by mouth.     Current Facility-Administered Medications on File Prior to Visit  Medication Dose Route Frequency Provider Last Rate Last Admin   denosumab (PROLIA) injection 60 mg  60 mg Subcutaneous Once Burch Marchuk, Gwenlyn Found, MD        Review of Systems:  As per HPI- otherwise negative.   Physical Examination: Vitals:   02/26/21 1021  BP: 122/70  Pulse: 87  Resp: 17  Temp: (!) 97.4 F (36.3 C)  SpO2: 98%   Vitals:   02/26/21 1021  Weight: 146 lb (66.2 kg)  Height: 5\' 3"  (1.6 m)   Body mass index is 25.86 kg/m. Ideal Body Weight: Weight in (lb) to have BMI = 25: 140.8  GEN: no acute distress.  Normal weight, looks well  HEENT: Atraumatic, Normocephalic.  Ears and Nose: No external deformity. CV: RRR, No M/G/R. No JVD. No thrill. No extra heart sounds. PULM: CTA B, no wheezes, crackles, rhonchi. No retractions. No resp. distress. No accessory muscle use. ABD: S, ND, +BS. No rebound. No HSM.  Mild epigastric tenderness  EXTR: No c/c/e PSYCH: Normally interactive. Conversant.  Foot exam- no foot drop, OW normal   Assessment and Plan: Type 2 diabetes mellitus without complication, without long-term current use of insulin (HCC) - Plan: Basic metabolic panel, Hemoglobin A1c, Microalbumin / creatinine urine ratio, CANCELED: Microalbumin / creatinine urine ratio  H/O gastric bypass - Plan: Vitamin B12, VITAMIN D 25 Hydroxy (Vit-D Deficiency, Fractures)  Chronic pain of both knees - Plan: diclofenac Sodium (VOLTAREN) 1 % GEL  Vitamin D  deficiency - Plan: VITAMIN D 25 Hydroxy (Vit-D Deficiency, Fractures)  B12 deficiency - Plan: Vitamin B12  Epigastric pain - Plan: sucralfate (CARAFATE) 1 g tablet, Lipase  Immunization due - Plan: Flu vaccine HIGH DOSE PF (Fluzone High dose)  Routine labs today  She is using Voltaren gel with good results for knee pain, refilled for her today She notes epigastric pain with history of ulcers.  She is already taking PPI-omeprazole 40 twice daily I will add Carafate, contacted her gastroenterologist and requested that they get her in for a visit If any new severe symptoms or major worsening she will seek care Flu shot given  This visit occurred during the SARS-CoV-2 public health emergency.  Safety protocols were in place, including screening questions prior to the visit, additional usage of staff PPE, and extensive cleaning of exam room while observing appropriate contact time as indicated for disinfecting solutions.   Signed , MD  Received her labs as below- message to pt  Results for orders placed or performed in visit on 02/26/21  Basic metabolic panel  Result Value Ref Range   Sodium 133 (L) 135 - 145 mEq/L   Potassium 4.6 3.5 - 5.1 mEq/L   Chloride 98 96 - 112 mEq/L   CO2 27 19 - 32 mEq/L   Glucose, Bld 143 (H) 70 - 99 mg/dL   BUN 13 6 - 23 mg/dL   Creatinine, Ser 04/28/21 0.40 - 1.20 mg/dL   GFR 4.01 02.72 mL/min   Calcium 9.1  8.4 - 10.5 mg/dL  Hemoglobin V2Y  Result Value Ref Range   Hgb A1c MFr Bld 6.0 4.6 - 6.5 %  Vitamin B12  Result Value Ref Range   Vitamin B-12 1,167 (H) 211 - 911 pg/mL  VITAMIN D 25 Hydroxy (Vit-D Deficiency, Fractures)  Result Value Ref Range   VITD 48.69 30.00 - 100.00 ng/mL  Lipase  Result Value Ref Range   Lipase 30.0 11.0 - 59.0 U/L

## 2021-02-26 ENCOUNTER — Ambulatory Visit (INDEPENDENT_AMBULATORY_CARE_PROVIDER_SITE_OTHER): Payer: Medicare Other | Admitting: Family Medicine

## 2021-02-26 ENCOUNTER — Encounter: Payer: Self-pay | Admitting: Family Medicine

## 2021-02-26 ENCOUNTER — Other Ambulatory Visit: Payer: Self-pay

## 2021-02-26 VITALS — BP 122/70 | HR 87 | Temp 97.4°F | Resp 17 | Ht 63.0 in | Wt 146.0 lb

## 2021-02-26 DIAGNOSIS — E559 Vitamin D deficiency, unspecified: Secondary | ICD-10-CM | POA: Diagnosis not present

## 2021-02-26 DIAGNOSIS — E119 Type 2 diabetes mellitus without complications: Secondary | ICD-10-CM

## 2021-02-26 DIAGNOSIS — Z23 Encounter for immunization: Secondary | ICD-10-CM | POA: Diagnosis not present

## 2021-02-26 DIAGNOSIS — M25562 Pain in left knee: Secondary | ICD-10-CM | POA: Diagnosis not present

## 2021-02-26 DIAGNOSIS — G8929 Other chronic pain: Secondary | ICD-10-CM | POA: Diagnosis not present

## 2021-02-26 DIAGNOSIS — Z9884 Bariatric surgery status: Secondary | ICD-10-CM | POA: Diagnosis not present

## 2021-02-26 DIAGNOSIS — E538 Deficiency of other specified B group vitamins: Secondary | ICD-10-CM

## 2021-02-26 DIAGNOSIS — R1013 Epigastric pain: Secondary | ICD-10-CM

## 2021-02-26 DIAGNOSIS — M25561 Pain in right knee: Secondary | ICD-10-CM | POA: Diagnosis not present

## 2021-02-26 LAB — BASIC METABOLIC PANEL
BUN: 13 mg/dL (ref 6–23)
CO2: 27 mEq/L (ref 19–32)
Calcium: 9.1 mg/dL (ref 8.4–10.5)
Chloride: 98 mEq/L (ref 96–112)
Creatinine, Ser: 0.65 mg/dL (ref 0.40–1.20)
GFR: 86.04 mL/min (ref >60.00)
Glucose, Bld: 143 mg/dL — ABNORMAL HIGH (ref 70–99)
Potassium: 4.6 mEq/L (ref 3.5–5.1)
Sodium: 133 mEq/L — ABNORMAL LOW (ref 135–145)

## 2021-02-26 LAB — LIPASE: Lipase: 30 U/L (ref 11.0–59.0)

## 2021-02-26 LAB — VITAMIN B12: Vitamin B-12: 1167 pg/mL — ABNORMAL HIGH (ref 211–911)

## 2021-02-26 LAB — VITAMIN D 25 HYDROXY (VIT D DEFICIENCY, FRACTURES): VITD: 48.69 ng/mL (ref 30.00–100.00)

## 2021-02-26 LAB — HEMOGLOBIN A1C: Hgb A1c MFr Bld: 6 % (ref 4.6–6.5)

## 2021-02-26 MED ORDER — SUCRALFATE 1 G PO TABS
1.0000 g | ORAL_TABLET | Freq: Three times a day (TID) | ORAL | 0 refills | Status: DC
Start: 2021-02-26 — End: 2021-03-29

## 2021-02-26 MED ORDER — DICLOFENAC SODIUM 1 % EX GEL: 2.0000 g | Freq: Four times a day (QID) | CUTANEOUS | 3 refills | Status: DC

## 2021-03-26 DIAGNOSIS — Z01419 Encounter for gynecological examination (general) (routine) without abnormal findings: Secondary | ICD-10-CM | POA: Diagnosis not present

## 2021-03-26 DIAGNOSIS — N763 Subacute and chronic vulvitis: Secondary | ICD-10-CM | POA: Diagnosis not present

## 2021-03-26 DIAGNOSIS — L280 Lichen simplex chronicus: Secondary | ICD-10-CM | POA: Diagnosis not present

## 2021-03-29 ENCOUNTER — Encounter: Payer: Self-pay | Admitting: Nurse Practitioner

## 2021-03-29 ENCOUNTER — Ambulatory Visit (INDEPENDENT_AMBULATORY_CARE_PROVIDER_SITE_OTHER): Payer: Medicare Other | Admitting: Nurse Practitioner

## 2021-03-29 VITALS — BP 120/60 | HR 70 | Ht 63.0 in | Wt 151.0 lb

## 2021-03-29 DIAGNOSIS — K219 Gastro-esophageal reflux disease without esophagitis: Secondary | ICD-10-CM | POA: Diagnosis not present

## 2021-03-29 DIAGNOSIS — R1013 Epigastric pain: Secondary | ICD-10-CM | POA: Diagnosis not present

## 2021-03-29 DIAGNOSIS — K5900 Constipation, unspecified: Secondary | ICD-10-CM | POA: Diagnosis not present

## 2021-03-29 DIAGNOSIS — K2270 Barrett's esophagus without dysplasia: Secondary | ICD-10-CM

## 2021-03-29 MED ORDER — SUCRALFATE 1 G PO TABS: ORAL_TABLET | ORAL | 1 refills | Status: DC

## 2021-03-29 NOTE — Progress Notes (Signed)
03/29/2021 Tannis Burstein [id number] 1945/08/23   CHIEF COMPLAINT: Epigastric pain  HISTORY OF PRESENT ILLNESS:  Tresa Jolley is a 75 year old female with a past medical history of arthritis, back pain, depression, asthma, sleep apnea, DM II, hyponatremia, GERD, Barrett's esophagus. Past gastric bypass. She is followed by Dr. Leone Payor.   She complains of having epigastric pain 3 to 4 days weekly for the paste 10 months which is worse after eating. No specific food triggers. Increased gas per the rectum. Stools are a bit stringy. No rectal bleeding or black stools. She is taking Omeprazole 40mg  po bid and her PCP prescribed Carafate 1 gm po qid x 10 days and her epigastric pain and gas production abated. However, once she stopped taking the Carafate her epigastric discomfort and increased flatulence recurred. She would like to resume taking Carafate. See her most recent EGD and colonoscopy results below.   EGD 11/04/2017: - Esophageal mucosal changes suggestive of short-segment Barrett's esophagus. Biopsied. 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 Gastrojejunal anastomosis characterized by healthy appearing mucosa. 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 Esophagus dilated. - 5 year EGD recall Surgical [P], esophagus - BARRETT ESOPHAGUS, NEGATIVE FOR DYSPLASIA.  Colonoscopy 03/25/2018: - The entire examined colon is normal. - No specimens collected.   CBC Latest Ref Rng & Units 08/23/2020 02/24/2020 08/26/2019  WBC 4.0 - 10.5 K/uL 5.7 5.5 5.4  Hemoglobin 12.0 - 15.0 g/dL 10/26/2019 85.4 11.6(L)  Hematocrit 36.0 - 46.0 % 39.0 37.1 34.2(L)  Platelets 150.0 - 400.0 K/uL 201.0 206 199.0    CMP Latest Ref Rng & Units 02/26/2021 08/23/2020 08/26/2019  Glucose 70 - 99 mg/dL 10/26/2019) 035(K) 093(G)  BUN 6 - 23 mg/dL 13 20 13   Creatinine 0.40 - 1.20 mg/dL 182(X 9.37  Sodium 135 - 145 mEq/L 133(L) 135 131(L)   Potassium 3.5 - 5.1 mEq/L 4.6 5.2 No hemolysis seen(H) 4.6  Chloride 96 - 112 mEq/L 98 99 98  CO2 19 - 32 mEq/L 27 28 28   Calcium 8.4 - 10.5 mg/dL 9.1 9.2 8.4  Total Protein 6.0 - 8.3 g/dL - 6.6 -  Total Bilirubin 0.2 - 1.2 mg/dL - 0.5 -  Alkaline Phos 39 - 117 U/L - 59 -  AST 0 - 37 U/L - 21 -  ALT 0 - 35 U/L - 20 -    Past Medical History:  Diagnosis Date   Allergy    Anemia    Arthritis    Asthma    Back pain    Barrett's esophagus    Bladder infection    Cataract    Depression    Diabetes mellitus, type II (HCC)    Gallstones    Gastritis    GERD (gastroesophageal reflux disease)    Hyperlipidemia    Memory deficits    Osteoporosis 12/09/2017   PTSD (post-traumatic stress disorder)    Sleep apnea    Thyroid disease    Patient denies   Past Surgical History:  Procedure Laterality Date   CHOLECYSTECTOMY  1999   COLONOSCOPY     ESOPHAGOGASTRODUODENOSCOPY     GANGLION CYST EXCISION Left    GASTRIC BYPASS  2000   TONSILLECTOMY AND ADENOIDECTOMY     age 70   Social History: She reports that she has never smoked. She has never used smokeless tobacco. She reports that she does not drink alcohol and does  not use drugs.  Family History: family history includes Alcohol abuse in her father; Asthma in her father; Depression in her mother; Diabetes in her paternal grandmother; Heart disease in her father and mother; Kidney disease in her mother; Other in her daughter and father; Prostate cancer in her father; Tremor in her father.  Allergies  Allergen Reactions   Metformin Diarrhea      Outpatient Encounter Medications as of 03/29/2021  Medication Sig   acetaminophen (TYLENOL) 500 MG tablet Take by mouth.   aspirin 81 MG EC tablet Take 81 mg by mouth at bedtime. Swallow whole.   b complex vitamins tablet Take 1 tablet by mouth 2 (two) times daily.   BIOTIN 5000 PO Take 1 tablet by mouth 2 (two) times daily.   calcium carbonate (OSCAL) 1500 (600 Ca) MG TABS tablet  Take by mouth.   Cholecalciferol (VITAMIN D3) 1.25 MG (50000 UT) CAPS Take 1 weekly for 12 weeks (Patient taking differently: 500 Units in the morning and at bedtime. Take 1 weekly for 12 weeks)   clindamycin (CLINDAGEL) 1 % gel Apply topically 2 (two) times daily.   Cyanocobalamin (VITAMIN B12 TR PO) Take 500 mcg by mouth at bedtime.    diclofenac Sodium (VOLTAREN) 1 % GEL Apply 2 g topically 4 (four) times daily. Use as needed for joint pain   Ferrous Sulfate (IRON PO) Take 1 tablet by mouth 3 (three) times a week.   fluticasone (FLONASE) 50 MCG/ACT nasal spray SPRAY 2 SPRAYS INTO EACH NOSTRIL EVERY DAY   gabapentin (NEURONTIN) 300 MG capsule TAKE 1 CAPSULE BY MOUTH EVERY NIGHT   lamoTRIgine (LAMICTAL) 100 MG tablet Take 1 tablet (100 mg total) by mouth daily.   loperamide (IMODIUM A-D) 2 MG tablet Take 2 mg by mouth as needed.    omeprazole (PRILOSEC) 40 MG capsule TAKE 1 CAPSULE BY MOUTH TWICE A DAY   primidone (MYSOLINE) 50 MG tablet TAKE 2 TABLETS EVERY MORNING TAKE 1 TABLET AT NOON AND TAKE 1 TABLET AT SUPPER   sucralfate (CARAFATE) 1 g tablet Take 1 tablet (1 g total) by mouth 4 (four) times daily -  with meals and at bedtime.   triamcinolone (KENALOG) 0.025 % cream Apply to lip twice daily for 10 days.   venlafaxine XR (EFFEXOR-XR) 150 MG 24 hr capsule Take 1 capsule (150 mg total) by mouth daily.   Facility-Administered Encounter Medications as of 03/29/2021  Medication   denosumab (PROLIA) injection 60 mg    REVIEW OF SYSTEMS: Gen: Denies fever, sweats or chills. No weight loss.  CV: Denies chest pain, palpitations or edema. Resp: Denies cough, shortness of breath of hemoptysis.  GI: See HPI.  GU : Denies urinary burning, blood in urine, increased urinary frequency or incontinence. MS: + Arthritis.  Derm: Denies rash, itchiness, skin lesions or unhealing ulcers. Psych: + Depression.  Heme: Denies bruising, bleeding. Neuro:  Denies headaches, dizziness or paresthesias. Endo:   + DM II.   PHYSICAL EXAM: BP 120/60   Pulse 70   Ht 5\' 3"  (1.6 m)   Wt 151 lb (68.5 kg)   SpO2 98%   BMI 26.75 kg/m  Wt Readings from Last 3 Encounters:  03/29/21 151 lb (68.5 kg)  02/26/21 146 lb (66.2 kg)  08/31/20 139 lb (63 kg)    General: Delightful 75 year old female in no acute distress. Head: Normocephalic and atraumatic. Eyes:  Sclerae non-icteric, conjunctive pink. Ears: Normal auditory acuity. Mouth: Dentition intact. No ulcers or lesions.  Neck:  Supple, no lymphadenopathy or thyromegaly.  Lungs: Clear bilaterally to auscultation without wheezes, crackles or rhonchi. Heart: Regular rate and rhythm. No murmur, rub or gallop appreciated.  Abdomen: Soft, nontender, non distended. No masses. No hepatosplenomegaly. Normoactive bowel sounds x 4 quadrants.  Rectal: Deferred.  Musculoskeletal: Symmetrical with no gross deformities. Skin: Warm and dry. No rash or lesions on visible extremities. Extremities: No edema. Neurological: Alert oriented x 4, no focal deficits.  Psychological:  Alert and cooperative. Normal mood and affect.  ASSESSMENT AND PLAN:  57) 75 year old female s/p past gastric bypass surgery, GERD and Barrett's esophagus with epigastric pain x 10 months which significantly improved after taking Carafate x 10 days then recurred  -Continue Omeprazole 40mg  po bid -Carafate 1gm po bid  -GERD diet -Next surveillance EGD due 10/2022, earlier if symptoms persist  -Follow up as needed  2) Constipation, flatulence  -Miralax as tolerated  -2 prunes daily  3) DM II -Diet controlled   4) Colon cancer screening. Normal colonoscopy in 2019. -No further screening colonoscopies due to age     CC:  [name], 2020, MD

## 2021-03-29 NOTE — Patient Instructions (Addendum)
If you are age 75 or younger, your body mass index should be between 19-25. Your Body mass index is 26.75 kg/m. If this is out of the aformentioned range listed, please consider follow up with your Primary Care Provider.   The [facility] providers would like to encourage you to use [facility] to communicate with providers for non-urgent requests or questions.  Due to long hold times on the telephone, sending your provider a message by [facility] may be faster and more efficient way to get a response. Please allow 48 business hours for a response.  Please remember that this is for non-urgent requests/questions.  We have sent in a new prescription for Carafate. Take 1 tablet two times a day, do not take within 2 hours of any other medication.  Eat 2 prunes a day.  Your next EGD is due May 2024.  Please call our office if your symptoms worsen. It was great seeing you today! Thank you for entrusting me with your care and choosing [facility].  Arnaldo Natal, CRNP

## 2021-04-10 ENCOUNTER — Telehealth: Payer: Self-pay | Admitting: Nurse Practitioner

## 2021-04-10 NOTE — Telephone Encounter (Signed)
Patient called states the Sucralfate is too expensive and they told her they would be able to cover Metoclopramide please call patient to advise.

## 2021-04-13 NOTE — Telephone Encounter (Signed)
Notified patient of message from Colleen , patient expressed understanding and agreement, no further questions at this time.  

## 2021-04-19 ENCOUNTER — Other Ambulatory Visit: Payer: Self-pay | Admitting: Family Medicine

## 2021-04-19 DIAGNOSIS — Z961 Presence of intraocular lens: Secondary | ICD-10-CM | POA: Diagnosis not present

## 2021-04-19 DIAGNOSIS — E119 Type 2 diabetes mellitus without complications: Secondary | ICD-10-CM | POA: Diagnosis not present

## 2021-04-19 DIAGNOSIS — G250 Essential tremor: Secondary | ICD-10-CM

## 2021-04-19 DIAGNOSIS — H524 Presbyopia: Secondary | ICD-10-CM | POA: Diagnosis not present

## 2021-04-19 DIAGNOSIS — H04123 Dry eye syndrome of bilateral lacrimal glands: Secondary | ICD-10-CM | POA: Diagnosis not present

## 2021-04-19 DIAGNOSIS — H43813 Vitreous degeneration, bilateral: Secondary | ICD-10-CM | POA: Diagnosis not present

## 2021-04-19 DIAGNOSIS — H40013 Open angle with borderline findings, low risk, bilateral: Secondary | ICD-10-CM | POA: Diagnosis not present

## 2021-04-19 DIAGNOSIS — H26493 Other secondary cataract, bilateral: Secondary | ICD-10-CM | POA: Diagnosis not present

## 2021-04-19 DIAGNOSIS — R519 Headache, unspecified: Secondary | ICD-10-CM | POA: Diagnosis not present

## 2021-04-19 NOTE — Telephone Encounter (Signed)
Called patient back, she has Gaviscon chewable tablets so I told her to use those with same directions. Patient expressed understanding and agreement. No further questions at this time.

## 2021-04-19 NOTE — Telephone Encounter (Signed)
Patient called regarding the Gaviscon medication said she can only find it in pill form not a solution seeking advise because she would like to start the medication.

## 2021-04-24 ENCOUNTER — Telehealth: Payer: Self-pay | Admitting: Family Medicine

## 2021-04-24 NOTE — Telephone Encounter (Signed)
Pt would like a copy of her last labs mailed to her home.

## 2021-04-25 NOTE — Telephone Encounter (Signed)
Patient information printed, handed to Dahlia Client H.to be faxed.

## 2021-05-07 ENCOUNTER — Telehealth (HOSPITAL_COMMUNITY): Payer: Self-pay | Admitting: Psychiatry

## 2021-05-07 NOTE — Telephone Encounter (Signed)
I returned phone call.  Patient wants to know about her diagnosis.  Discussed her diagnosis that she suffers from PTSD and depression.  Patient acknowledged and appreciated phone call.

## 2021-05-24 ENCOUNTER — Other Ambulatory Visit: Payer: Self-pay

## 2021-05-24 ENCOUNTER — Telehealth (HOSPITAL_BASED_OUTPATIENT_CLINIC_OR_DEPARTMENT_OTHER): Payer: Medicare Other | Admitting: Psychiatry

## 2021-05-24 ENCOUNTER — Encounter (HOSPITAL_COMMUNITY): Payer: Self-pay | Admitting: Psychiatry

## 2021-05-24 DIAGNOSIS — F4310 Post-traumatic stress disorder, unspecified: Secondary | ICD-10-CM | POA: Diagnosis not present

## 2021-05-24 DIAGNOSIS — F331 Major depressive disorder, recurrent, moderate: Secondary | ICD-10-CM | POA: Diagnosis not present

## 2021-05-24 MED ORDER — LAMOTRIGINE 100 MG PO TABS: 100.0000 mg | ORAL_TABLET | Freq: Every day | ORAL | 0 refills | Status: DC

## 2021-05-24 MED ORDER — VENLAFAXINE HCL ER 150 MG PO CP24: 150.0000 mg | ORAL_CAPSULE | Freq: Every day | ORAL | 0 refills | Status: DC
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
## 2021-05-30 ENCOUNTER — Ambulatory Visit (INDEPENDENT_AMBULATORY_CARE_PROVIDER_SITE_OTHER): Payer: Medicare Other | Admitting: Family Medicine

## 2021-05-30 VITALS — BP 122/62 | HR 75 | Temp 98.4°F | Resp 18 | Wt 148.0 lb

## 2021-05-30 DIAGNOSIS — S61452A Open bite of left hand, initial encounter: Secondary | ICD-10-CM

## 2021-05-30 DIAGNOSIS — W5501XA Bitten by cat, initial encounter: Secondary | ICD-10-CM

## 2021-05-30 MED ORDER — CLINDAMYCIN HCL 300 MG PO CAPS: 300.0000 mg | ORAL_CAPSULE | Freq: Three times a day (TID) | ORAL | 0 refills | Status: DC

## 2021-05-30 MED ORDER — DOXYCYCLINE MONOHYDRATE 100 MG PO CAPS: 100.0000 mg | ORAL_CAPSULE | Freq: Two times a day (BID) | ORAL | 0 refills | Status: DC

## 2021-05-30 NOTE — Patient Instructions (Signed)
It was good to see you today but I am sorry you got bitten!   We will treat you with both doxycycline and clindamycin for infection Start asap  Let me know if you are not improving in the next 24 hours- sooner if worse Observe your cat at home for 10 days- if any sign of illness take to your vet

## 2021-05-30 NOTE — Progress Notes (Signed)
[facility] ([place]) [address] [place], [place] [address] [phone]  Date:  05/30/2021   Name:  Alexis [name]   DOB:  January 18, 1946   MRN:  [id number]  PCP:  Pearline Cables, MD    Chief Complaint: Edema (In left hand that she noticed this morning. She did mention that her cat bit her 2 days ago. She has some redness, pain with certain activities. She had some chills.)   History of Present Illness:  Alexis [name] is a 75 y.o. very pleasant female patient who presents with the following:  Pt is seen today with concern of swelling in her hand Last visit with myself in September  History of diet-controlled diabetes, gastric bypass, carotid artery stenosis, osteoporosis, benign tremor, depression, foot drop, lichen simplex chronicus  She had a cat bite from her pet cat on Monday.  Bite is on the dorsum of her left hand.  It seemed to be ok but this am it was swollen and red  The cat is vaccinated against rabies, healthy and gets all of its normal immunizations.  It is an indoor cat  Besides her hand she feels like she is having chills- she has not noted a fever   She has no medication allergies listed except for metformin.  Patient states she was previously thought to be allergic to antibiotics including penicillins.  She states she was retested about a year ago and told that she was no longer allergic, however she has not tried taking a penicillin since this retesting  Patient Active Problem List   Diagnosis Date Noted   Osteoporosis 12/09/2017   Syncope 07/17/2017   Shortness of breath 07/17/2017   Hyponatremia 03/05/2017   Lichen simplex chronicus 03/05/2017   Type 2 diabetes mellitus without complication, without long-term current use of insulin (HCC) 03/05/2017   Perianal lesion 01/31/2017   Bilateral carotid artery stenosis 12/12/2016   Restless legs 12/11/2016   History of gastric bypass 12/11/2016   PTSD  (post-traumatic stress disorder) 12/11/2016   Risk for falls 12/10/2016   Other dysphagia 08/19/2016   Right foot drop 08/19/2016   Chronic vulvitis 12/25/2015   Microcalcification of left breast on mammogram 05/11/2015   Tension type headache 06/14/2013   Essential tremor 04/13/2013   Hypersomnia 04/13/2013    Past Medical History:  Diagnosis Date   Allergy    Anemia    Arthritis    Asthma    Back pain    Barrett's esophagus    Bladder infection    Cataract    Depression    Diabetes mellitus, type II (HCC)    Gallstones    Gastritis    GERD (gastroesophageal reflux disease)    Hyperlipidemia    Memory deficits    Osteoporosis 12/09/2017   PTSD (post-traumatic stress disorder)    Sleep apnea    Thyroid disease    Patient denies    Past Surgical History:  Procedure Laterality Date   CHOLECYSTECTOMY  1999   COLONOSCOPY     ESOPHAGOGASTRODUODENOSCOPY     GANGLION CYST EXCISION Left    GASTRIC BYPASS  2000   TONSILLECTOMY AND ADENOIDECTOMY     age 73    Social History   Tobacco Use   Smoking status: Never   Smokeless tobacco: Never  Vaping Use   Vaping Use: Never used  Substance Use Topics   Alcohol use: No    Alcohol/week: 0.0 standard drinks  Drug use: No    Family History  Problem Relation Age of Onset   Depression Mother    Heart disease Mother    Kidney disease Mother    Alcohol abuse Father    Tremor Father    Prostate cancer Father    Other Father        Hypoglycemia   Heart disease Father    Asthma Father    Diabetes Paternal Grandmother    Other Daughter        stillborn   Colon cancer Neg Hx    Esophageal cancer Neg Hx    Rectal cancer Neg Hx    Stomach cancer Neg Hx    Allergic rhinitis Neg Hx    Eczema Neg Hx    Urticaria Neg Hx    Immunodeficiency Neg Hx    Angioedema Neg Hx     Allergies  Allergen Reactions   Metformin Diarrhea    Medication list has been reviewed and updated.  Current Outpatient Medications on File  Prior to Visit  Medication Sig Dispense Refill   acetaminophen (TYLENOL) 500 MG tablet Take by mouth.     aspirin 81 MG EC tablet Take 81 mg by mouth at bedtime. Swallow whole.     b complex vitamins tablet Take 1 tablet by mouth 2 (two) times daily.     BIOTIN 5000 PO Take 1 tablet by mouth 2 (two) times daily.     calcium carbonate (OSCAL) 1500 (600 Ca) MG TABS tablet Take by mouth.     Cholecalciferol (VITAMIN D3) 1.25 MG (50000 UT) CAPS Take 1 weekly for 12 weeks (Patient taking differently: 500 Units in the morning and at bedtime. Take 1 weekly for 12 weeks) 12 capsule 4   clindamycin (CLINDAGEL) 1 % gel Apply topically 2 (two) times daily. 30 g 0   Cyanocobalamin (VITAMIN B12 TR PO) Take 500 mcg by mouth at bedtime.      diclofenac Sodium (VOLTAREN) 1 % GEL Apply 2 g topically 4 (four) times daily. Use as needed for joint pain 100 g 3   Ferrous Sulfate (IRON PO) Take 1 tablet by mouth 3 (three) times a week.     fluticasone (FLONASE) 50 MCG/ACT nasal spray SPRAY 2 SPRAYS INTO EACH NOSTRIL EVERY DAY 48 mL 1   gabapentin (NEURONTIN) 300 MG capsule TAKE 1 CAPSULE BY MOUTH EVERY NIGHT 90 capsule 3   lamoTRIgine (LAMICTAL) 100 MG tablet Take 1 tablet (100 mg total) by mouth daily. 90 tablet 0   loperamide (IMODIUM A-D) 2 MG tablet Take 2 mg by mouth as needed.      omeprazole (PRILOSEC) 40 MG capsule TAKE 1 CAPSULE BY MOUTH TWICE A DAY 180 capsule 3   primidone (MYSOLINE) 50 MG tablet TAKE 2 TABLETS EVERY MORNING TAKE 1 TABLET AT NOON AND TAKE 1 TABLET AT SUPPER 360 tablet 3   sucralfate (CARAFATE) 1 g tablet Take 1 tablet twice a day. Do not take within 2 hours of any other medication. 60 tablet 1   triamcinolone (KENALOG) 0.025 % cream Apply to lip twice daily for 10 days. 30 g 0   venlafaxine XR (EFFEXOR-XR) 150 MG 24 hr capsule Take 1 capsule (150 mg total) by mouth daily. 90 capsule 0   Current Facility-Administered Medications on File Prior to Visit  Medication Dose Route Frequency  Provider Last Rate Last Admin   denosumab (PROLIA) injection 60 mg  60 mg Subcutaneous Once Kassy Mcenroe, Gwenlyn Found, MD  Review of Systems:  As per HPI- otherwise negative.   Physical Examination: Vitals:   05/30/21 1503  BP: 122/62  Pulse: 75  Resp: 18  Temp: 98.4 F (36.9 C)  SpO2: 95%   Vitals:   05/30/21 1503  Weight: 148 lb (67.1 kg)   Body mass index is 26.22 kg/m. Ideal Body Weight:    GEN: no acute distress.  Looks well and her normal self.  Significant tremors baseline HEENT: Atraumatic, Normocephalic.  Ears and Nose: No external deformity. CV: RRR, No M/G/R. No JVD. No thrill. No extra heart sounds. PULM: CTA B, no wheezes, crackles, rhonchi. No retractions. No resp. distress. No accessory muscle use. EXTR: No c/c/e PSYCH: Normally interactive. Conversant.  Left hand: Displays a small bite wound on the dorsum of the left hand.  There is moderate swelling and some tenderness.  Grip strength is normal No purulent drainage     Assessment and Plan: Cat bite, initial encounter - Plan: doxycycline (MONODOX) 100 MG capsule, clindamycin (CLEOCIN) 300 MG capsule  Bite wound of left hand, initial encounter - Plan: doxycycline (MONODOX) 100 MG capsule, clindamycin (CLEOCIN) 300 MG capsule  Patient seen today with a cat bite to the left hand, it has become infected.  There is some questionable history of penicillin allergy.  As above, patient has been apparently tested and told she was no longer allergic.  However, as we are able to use an alternative regimen we will do so  Will treat with doxycycline and clindamycin.  I urged patient to keep a close eye on her bite, if not improving within 24 hours she will let me know.  I also advised her the cat needs to be observed for 10 days, if any sign of illness take to her veterinarian  Signed Abbe Amsterdam, MD

## 2021-05-31 ENCOUNTER — Other Ambulatory Visit: Payer: Self-pay | Admitting: Family Medicine

## 2021-05-31 DIAGNOSIS — G250 Essential tremor: Secondary | ICD-10-CM

## 2021-06-19 ENCOUNTER — Telehealth: Payer: Self-pay

## 2021-06-19 NOTE — Telephone Encounter (Signed)
Prolia VOB initiated via [email or web address] ° °Last OV:  °Next OV:  °Last Prolia inj: 02/08/21 °Next Prolia inj DUE: 08/12/21 ° °

## 2021-06-22 DIAGNOSIS — D649 Anemia, unspecified: Secondary | ICD-10-CM | POA: Diagnosis not present

## 2021-06-22 DIAGNOSIS — M109 Gout, unspecified: Secondary | ICD-10-CM | POA: Diagnosis not present

## 2021-06-22 DIAGNOSIS — R309 Painful micturition, unspecified: Secondary | ICD-10-CM | POA: Diagnosis not present

## 2021-06-22 DIAGNOSIS — E559 Vitamin D deficiency, unspecified: Secondary | ICD-10-CM | POA: Diagnosis not present

## 2021-06-22 DIAGNOSIS — E119 Type 2 diabetes mellitus without complications: Secondary | ICD-10-CM | POA: Diagnosis not present

## 2021-06-22 DIAGNOSIS — E871 Hypo-osmolality and hyponatremia: Secondary | ICD-10-CM | POA: Diagnosis not present

## 2021-06-22 DIAGNOSIS — R809 Proteinuria, unspecified: Secondary | ICD-10-CM | POA: Diagnosis not present

## 2021-06-22 DIAGNOSIS — K219 Gastro-esophageal reflux disease without esophagitis: Secondary | ICD-10-CM | POA: Diagnosis not present

## 2021-06-23 NOTE — Telephone Encounter (Signed)
Pt ready for scheduling on or after 08/12/21  Out-of-pocket cost due at time of visit: $0.00  Primary: Medicare Prolia co-insurance: 20% (approximately $276) Admin fee co-insurance: 20% (approximately $25)  Secondary: [employer] Medicare Supp Prolia co-insurance: Covers Medicare Part B co-insurance and deductible.  Admin fee co-insurance: Covers Medicare Part B co-insurance and deductible.   Deductible: $0 of $226 met, covered by secondary  Prior Auth: not required PA# Valid:   ** This summary of benefits is an estimation of the patient's out-of-pocket cost. Exact cost may vary based on individual plan coverage.

## 2021-06-28 NOTE — Telephone Encounter (Signed)
Left vm to return call.    

## 2021-07-19 DIAGNOSIS — Z7982 Long term (current) use of aspirin: Secondary | ICD-10-CM | POA: Insufficient documentation

## 2021-07-19 DIAGNOSIS — I6523 Occlusion and stenosis of bilateral carotid arteries: Secondary | ICD-10-CM | POA: Diagnosis not present

## 2021-07-19 DIAGNOSIS — E119 Type 2 diabetes mellitus without complications: Secondary | ICD-10-CM | POA: Diagnosis not present

## 2021-07-30 DIAGNOSIS — Z1231 Encounter for screening mammogram for malignant neoplasm of breast: Secondary | ICD-10-CM | POA: Diagnosis not present

## 2021-07-30 LAB — HM MAMMOGRAPHY

## 2021-08-07 ENCOUNTER — Telehealth: Payer: Self-pay | Admitting: Family Medicine

## 2021-08-07 NOTE — Telephone Encounter (Signed)
Is this brand okay?

## 2021-08-07 NOTE — Telephone Encounter (Signed)
Pt would like to know if she can take a magnesium supplement by the brand name of Calm. Please advise.

## 2021-08-08 NOTE — Telephone Encounter (Signed)
Pt aware and voices understanding.   

## 2021-08-16 ENCOUNTER — Encounter (HOSPITAL_COMMUNITY): Payer: Self-pay | Admitting: Psychiatry

## 2021-08-16 ENCOUNTER — Telehealth (HOSPITAL_BASED_OUTPATIENT_CLINIC_OR_DEPARTMENT_OTHER): Payer: Medicare Other | Admitting: Psychiatry

## 2021-08-16 ENCOUNTER — Ambulatory Visit: Payer: Medicare Other

## 2021-08-16 ENCOUNTER — Other Ambulatory Visit: Payer: Self-pay

## 2021-08-16 DIAGNOSIS — F4310 Post-traumatic stress disorder, unspecified: Secondary | ICD-10-CM

## 2021-08-16 DIAGNOSIS — F331 Major depressive disorder, recurrent, moderate: Secondary | ICD-10-CM

## 2021-08-16 MED ORDER — LAMOTRIGINE 100 MG PO TABS: 100.0000 mg | ORAL_TABLET | Freq: Every day | ORAL | 0 refills | Status: DC

## 2021-08-16 MED ORDER — VENLAFAXINE HCL ER 150 MG PO CP24: 150.0000 mg | ORAL_CAPSULE | Freq: Every day | ORAL | 0 refills | Status: DC
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
## 2021-08-17 ENCOUNTER — Ambulatory Visit: Payer: Medicare Other

## 2021-08-20 NOTE — Telephone Encounter (Signed)
Patient scheduled to come in tomorrow for prolia injection ?

## 2021-08-21 ENCOUNTER — Ambulatory Visit (INDEPENDENT_AMBULATORY_CARE_PROVIDER_SITE_OTHER): Payer: Medicare Other

## 2021-08-21 DIAGNOSIS — M810 Age-related osteoporosis without current pathological fracture: Secondary | ICD-10-CM

## 2021-08-21 MED ORDER — DENOSUMAB 60 MG/ML ~~LOC~~ SOSY
60.0000 mg | PREFILLED_SYRINGE | Freq: Once | SUBCUTANEOUS | Status: AC
  Administered 2021-08-21: 60 mg via SUBCUTANEOUS

## 2021-08-21 NOTE — Progress Notes (Signed)
Lyndel Dancel is a 76 y.o. female presents to the office today for a prolia injection, per [name], Gwenlyn Found, MD ? ?Original order: denosuman 60mg /62mL, SQ was administered in the right arm today. Patient tolerated injection well. ? ?Patient next injection due: 02/20/22 ? ? ?04/22/22 Frierson-Sandells, CMA  ?

## 2021-08-24 NOTE — Patient Instructions (Addendum)
It was good to see you again today, I will be in touch with your labs as soon as possible ?Assuming all is well, please see me in about 6 months ?Consider getting a COVID-19 booster at your pharmacy ?I think ok to stop using the gabapentin - however if you stop it and you feel worse also ok to keep using it!   ? ?We will check your bone density this fall  ?

## 2021-08-24 NOTE — Progress Notes (Addendum)
[facility] [address] [place], [place] [id number] [phone] [phone]  Date:  08/27/2021   Name:  Alexis BoringSandra Claassen   DOB:  March 04, 1946   MRN:  [id number]  PCP:  Pearline Cablesopland, Evetta Renner C, MD    Chief Complaint: 6 month follow up (Concerns/ questions: pt asks about stopping her Gabapentin. Maurice Small/Last Eye Exam: scheduled already. /)   History of Present Illness:  Alexis BoringSandra Basu is a 76 y.o. very pleasant female patient who presents with the following:  Patient is seen today for 1137-month follow-up- History of diet-controlled diabetes, gastric bypass, carotid artery stenosis, osteoporosis, benign tremor, depression, foot drop, lichen simplex chronicus Most recent visit with myself was in December after she was bitten by cat-her hand has healed up normally  She saw vascular surgery with [facility] in February to follow-up on her carotid stenosis-it looks like she has stable stenosis, asked her to come back in 2 years  Eye exam- scheduled  Urine microalbumin is due Can update A1c, CMP, lipids, microalbumin, TSH  Bone density can be updated this fall; she is using Prolia Mammogram is up-to-date Lab Results  Component Value Date   HGBA1C 6.0 02/26/2021   Dois DavenportSandra wonders about stopping her gabapentin- she is just using it for RLS but this is not bothering her as much now, she is sleeping well at night.  She notes see is doing pretty well She does have a hand tremor and is taking primidone- she does feel like this helps her   She has a nephology appt next month she thinks - she is seen by [facility]   She sees Dr Lolly MustacheArfeen for psychiatry    Patient Active Problem List   Diagnosis Date Noted   Osteoporosis 12/09/2017   Syncope 07/17/2017   Shortness of breath 07/17/2017   Hyponatremia 03/05/2017   Lichen simplex chronicus 03/05/2017   Type 2 diabetes mellitus without complication, without long-term current use of insulin (HCC)  03/05/2017   Perianal lesion 01/31/2017   Bilateral carotid artery stenosis 12/12/2016   Restless legs 12/11/2016   History of gastric bypass 12/11/2016   PTSD (post-traumatic stress disorder) 12/11/2016   Risk for falls 12/10/2016   Other dysphagia 08/19/2016   Right foot drop 08/19/2016   Chronic vulvitis 12/25/2015   Microcalcification of left breast on mammogram 05/11/2015   Tension type headache 06/14/2013   Essential tremor 04/13/2013   Hypersomnia 04/13/2013    Past Medical History:  Diagnosis Date   Allergy    Anemia    Arthritis    Asthma    Back pain    Barrett's esophagus    Bladder infection    Cataract    Depression    Diabetes mellitus, type II (HCC)    Gallstones    Gastritis    GERD (gastroesophageal reflux disease)    Hyperlipidemia    Memory deficits    Osteoporosis 12/09/2017   PTSD (post-traumatic stress disorder)    Sleep apnea    Thyroid disease    Patient denies    Past Surgical History:  Procedure Laterality Date   CHOLECYSTECTOMY  1999   COLONOSCOPY     ESOPHAGOGASTRODUODENOSCOPY     GANGLION CYST EXCISION Left    GASTRIC BYPASS  2000   TONSILLECTOMY AND ADENOIDECTOMY     age 76    Social History   Tobacco Use   Smoking status: Never   Smokeless tobacco: Never  Vaping Use  Vaping Use: Never used  Substance Use Topics   Alcohol use: No    Alcohol/week: 0.0 standard drinks   Drug use: No    Family History  Problem Relation Age of Onset   Depression Mother    Heart disease Mother    Kidney disease Mother    Alcohol abuse Father    Tremor Father    Prostate cancer Father    Other Father        Hypoglycemia   Heart disease Father    Asthma Father    Diabetes Paternal Grandmother    Other Daughter        stillborn   Colon cancer Neg Hx    Esophageal cancer Neg Hx    Rectal cancer Neg Hx    Stomach cancer Neg Hx    Allergic rhinitis Neg Hx    Eczema Neg Hx    Urticaria Neg Hx    Immunodeficiency Neg Hx     Angioedema Neg Hx     Allergies  Allergen Reactions   Metformin Diarrhea    Medication list has been reviewed and updated.  Current Outpatient Medications on File Prior to Visit  Medication Sig Dispense Refill   acetaminophen (TYLENOL) 500 MG tablet Take by mouth.     aspirin 81 MG EC tablet Take 81 mg by mouth at bedtime. Swallow whole.     b complex vitamins tablet Take 1 tablet by mouth 2 (two) times daily.     BIOTIN 5000 PO Take 1 tablet by mouth 2 (two) times daily.     calcium carbonate (OSCAL) 1500 (600 Ca) MG TABS tablet Take by mouth.     Cholecalciferol (VITAMIN D3) 1.25 MG (50000 UT) CAPS Take 1 weekly for 12 weeks (Patient taking differently: 500 Units in the morning and at bedtime. Take 1 weekly for 12 weeks) 12 capsule 4   clindamycin (CLINDAGEL) 1 % gel Apply topically 2 (two) times daily. 30 g 0   Cyanocobalamin (VITAMIN B12 TR PO) Take 500 mcg by mouth at bedtime.      diclofenac Sodium (VOLTAREN) 1 % GEL Apply 2 g topically 4 (four) times daily. Use as needed for joint pain 100 g 3   Ferrous Sulfate (IRON PO) Take 1 tablet by mouth 3 (three) times a week.     fluticasone (FLONASE) 50 MCG/ACT nasal spray SPRAY 2 SPRAYS INTO EACH NOSTRIL EVERY DAY 48 mL 1   gabapentin (NEURONTIN) 300 MG capsule TAKE 1 CAPSULE BY MOUTH EVERY DAY AT NIGHT 90 capsule 3   lamoTRIgine (LAMICTAL) 100 MG tablet Take 1 tablet (100 mg total) by mouth daily. 90 tablet 0   loperamide (IMODIUM A-D) 2 MG tablet Take 2 mg by mouth as needed.      omeprazole (PRILOSEC) 40 MG capsule TAKE 1 CAPSULE BY MOUTH TWICE A DAY 180 capsule 3   primidone (MYSOLINE) 50 MG tablet TAKE 2 TABLETS EVERY MORNING TAKE 1 TABLET AT NOON AND TAKE 1 TABLET AT SUPPER 360 tablet 3   sucralfate (CARAFATE) 1 g tablet Take 1 tablet twice a day. Do not take within 2 hours of any other medication. 60 tablet 1   triamcinolone (KENALOG) 0.025 % cream Apply to lip twice daily for 10 days. 30 g 0   venlafaxine XR (EFFEXOR-XR) 150 MG  24 hr capsule Take 1 capsule (150 mg total) by mouth daily. 90 capsule 0   Current Facility-Administered Medications on File Prior to Visit  Medication Dose Route Frequency Provider Last  Rate Last Admin   denosumab (PROLIA) injection 60 mg  60 mg Subcutaneous Once Kylar Speelman, Gwenlyn Found, MD        Review of Systems:  As per HPI- otherwise negative.   Physical Examination: Vitals:   08/27/21 1026  BP: 132/80  Pulse: 68  Resp: 18  Temp: 98.4 F (36.9 C)  SpO2: 99%   Vitals:   08/27/21 1026  Weight: 150 lb (68 kg)  Height: 5\' 3"  (1.6 m)   Body mass index is 26.57 kg/m. Ideal Body Weight: Weight in (lb) to have BMI = 25: 140.8  GEN: no acute distress.  Looks well and her normal self today HEENT: Atraumatic, Normocephalic.  Ears and Nose: No external deformity. CV: RRR, No M/G/R. No JVD. No thrill. No extra heart sounds. PULM: CTA B, no wheezes, crackles, rhonchi. No retractions. No resp. distress. No accessory muscle use. ABD: S, NT, ND, +BS. No rebound. No HSM. EXTR: No c/c/e PSYCH: Normally interactive. Conversant.  A resting hand tremor is present, more prominent on the right  Assessment and Plan: Type 2 diabetes mellitus without complication, without long-term current use of insulin (HCC) - Plan: Comprehensive metabolic panel, Hemoglobin A1c, Microalbumin / creatinine urine ratio, CANCELED: Microalbumin / creatinine urine ratio  H/O gastric bypass - Plan: CBC  Screening for thyroid disorder - Plan: TSH  Screening, lipid - Plan: Lipid panel  Tremor, hereditary, benign  Medication monitoring encounter - Plan: CBC  Patient seen today for follow-up.  Labs are pending as above to monitor diabetes She continues to see improvement with primidone for tremor, but the symptoms are not resolved We discussed her gabapentin.  Certainly she may try coming off this and see if she misses it.  Okay to go back on it if she prefers taking it Will plan further follow- up pending  labs.   Signed , MD  Received patient labs as below, sent letter  Results for orders placed or performed in visit on 08/27/21  CBC  Result Value Ref Range   WBC 7.4 4.0 - 10.5 K/uL   RBC 4.05 3.87 - 5.11 Mil/uL   Platelets 209.0 150.0 - 400.0 K/uL   Hemoglobin 13.0 12.0 - 15.0 g/dL   HCT 08/29/21 93.8 - 10.1 %   MCV 95.6 78.0 - 100.0 fl   MCHC 33.6 30.0 - 36.0 g/dL   RDW 75.1 02.5 - 85.2 %  Comprehensive metabolic panel  Result Value Ref Range   Sodium 132 (L) 135 - 145 mEq/L   Potassium 5.0 3.5 - 5.1 mEq/L   Chloride 97 96 - 112 mEq/L   CO2 27 19 - 32 mEq/L   Glucose, Bld 119 (H) 70 - 99 mg/dL   BUN 15 6 - 23 mg/dL   Creatinine, Ser 77.8 0.40 - 1.20 mg/dL   Total Bilirubin 0.5 0.2 - 1.2 mg/dL   Alkaline Phosphatase 55 39 - 117 U/L   AST 23 0 - 37 U/L   ALT 29 0 - 35 U/L   Total Protein 6.3 6.0 - 8.3 g/dL   Albumin 4.3 3.5 - 5.2 g/dL   GFR 2.42 35.36 mL/min   Calcium 8.9 8.4 - 10.5 mg/dL  Lipid panel  Result Value Ref Range   Cholesterol 199 0 - 200 mg/dL   Triglycerides >14.43 0.0 - 149.0 mg/dL   HDL 154.0 08.67 mg/dL   VLDL >61.95 0.0 - 09.3 mg/dL   LDL Cholesterol 83 0 - 99 mg/dL   Total CHOL/HDL Ratio 2  NonHDL 107.40   Hemoglobin A1c  Result Value Ref Range   Hgb A1c MFr Bld 6.2 4.6 - 6.5 %  TSH  Result Value Ref Range   TSH 1.00 0.35 - 5.50 uIU/mL

## 2021-08-27 ENCOUNTER — Ambulatory Visit (INDEPENDENT_AMBULATORY_CARE_PROVIDER_SITE_OTHER): Payer: Medicare Other | Admitting: Family Medicine

## 2021-08-27 VITALS — BP 132/80 | HR 68 | Temp 98.4°F | Resp 18 | Ht 63.0 in | Wt 150.0 lb

## 2021-08-27 DIAGNOSIS — Z1329 Encounter for screening for other suspected endocrine disorder: Secondary | ICD-10-CM

## 2021-08-27 DIAGNOSIS — Z5181 Encounter for therapeutic drug level monitoring: Secondary | ICD-10-CM

## 2021-08-27 DIAGNOSIS — Z9884 Bariatric surgery status: Secondary | ICD-10-CM

## 2021-08-27 DIAGNOSIS — G250 Essential tremor: Secondary | ICD-10-CM

## 2021-08-27 DIAGNOSIS — E119 Type 2 diabetes mellitus without complications: Secondary | ICD-10-CM | POA: Diagnosis not present

## 2021-08-27 DIAGNOSIS — Z13220 Encounter for screening for lipoid disorders: Secondary | ICD-10-CM

## 2021-08-27 LAB — COMPREHENSIVE METABOLIC PANEL
ALT: 29 U/L (ref 0–35)
AST: 23 U/L (ref 0–37)
Albumin: 4.3 g/dL (ref 3.5–5.2)
Alkaline Phosphatase: 55 U/L (ref 39–117)
BUN: 15 mg/dL (ref 6–23)
CO2: 27 mEq/L (ref 19–32)
Calcium: 8.9 mg/dL (ref 8.4–10.5)
Chloride: 97 mEq/L (ref 96–112)
Creatinine, Ser: 0.68 mg/dL (ref 0.40–1.20)
GFR: 84.81 mL/min (ref >60.00)
Glucose, Bld: 119 mg/dL — ABNORMAL HIGH (ref 70–99)
Potassium: 5 mEq/L (ref 3.5–5.1)
Sodium: 132 mEq/L — ABNORMAL LOW (ref 135–145)
Total Bilirubin: 0.5 mg/dL (ref 0.2–1.2)
Total Protein: 6.3 g/dL (ref 6.0–8.3)

## 2021-08-27 LAB — LIPID PANEL
Cholesterol: 199 mg/dL (ref 0–200)
HDL: 91.9 mg/dL (ref >39.00)
LDL Cholesterol: 83 mg/dL (ref 0–99)
NonHDL: 107.4
Total CHOL/HDL Ratio: 2
Triglycerides: 120 mg/dL (ref 0.0–149.0)
VLDL: 24 mg/dL (ref 0.0–40.0)

## 2021-08-27 LAB — CBC
HCT: 38.7 % (ref 36.0–46.0)
Hemoglobin: 13 g/dL (ref 12.0–15.0)
MCHC: 33.6 g/dL (ref 30.0–36.0)
MCV: 95.6 fl (ref 78.0–100.0)
Platelets: 209 10*3/uL (ref 150.0–400.0)
RBC: 4.05 Mil/uL (ref 3.87–5.11)
RDW: 13.5 % (ref 11.5–15.5)
WBC: 7.4 10*3/uL (ref 4.0–10.5)

## 2021-08-27 LAB — TSH: TSH: 1 u[IU]/mL (ref 0.35–5.50)

## 2021-08-27 LAB — HEMOGLOBIN A1C: Hgb A1c MFr Bld: 6.2 % (ref 4.6–6.5)

## 2021-08-29 NOTE — Telephone Encounter (Signed)
Last Prolia inj 08/21/21 ?Next Prolia inj due 02/22/22 ?

## 2021-09-13 ENCOUNTER — Ambulatory Visit (INDEPENDENT_AMBULATORY_CARE_PROVIDER_SITE_OTHER): Payer: Medicare Other

## 2021-09-13 VITALS — Ht 63.0 in | Wt 150.0 lb

## 2021-09-13 DIAGNOSIS — Z0000 Encounter for general adult medical examination without abnormal findings: Secondary | ICD-10-CM | POA: Diagnosis not present

## 2021-09-13 NOTE — Progress Notes (Signed)
? ?Subjective:  ? Alexis [name] is a 76 y.o. female who presents for Medicare Annual (Subsequent) preventive examination. ?Virtual Visit via Telephone Note ? ?I connected with  Alexis [name] on 09/13/21 at 10:45 AM EDT by telephone and verified that I am speaking with the correct person using two identifiers. ? ?Location: ?Patient: HOME ?Provider: [facility] ?Persons participating in the virtual visit: patient/Nurse Health Advisor ?  ?I discussed the limitations, risks, security and privacy concerns of performing an evaluation and management service by telephone and the availability of in person appointments. The patient expressed understanding and agreed to proceed. ? ?Interactive audio and video telecommunications were attempted between this nurse and patient, however failed, due to patient having technical difficulties OR patient did not have access to video capability.  We continued and completed visit with audio only. ? ?Some vital signs may be absent or patient reported.  ? ?Alexis Dash, LPN ? ?Review of Systems    ? ?Cardiac Risk Factors include: advanced age (>68men, >19 women);diabetes mellitus;dyslipidemia;sedentary lifestyle ? ?   ?Objective:  ?  ?Today's Vitals  ? 09/13/21 1044  ?Weight: 150 lb (68 kg)  ?Height: 5\' 3"  (1.6 m)  ? ?Body mass index is 26.57 kg/m?. ? ? ?  09/13/2021  ? 10:53 AM 09/11/2020  ?  2:04 PM 08/31/2020  ? 12:43 PM 08/24/2019  ? 11:19 AM 04/06/2018  ? 10:29 AM 11/04/2017  ?  9:41 AM 10/17/2016  ? 10:24 AM  ?Advanced Directives  ?Does Patient Have a Medical Advance Directive? No Yes No No  No   ?Type of Advance Directive  Living will       ?Does patient want to make changes to medical advance directive?  No - Patient declined Yes (MAU/Ambulatory/Procedural Areas - Information given)      ?Would patient like information on creating a medical advance directive? No - Patient declined   No - Patient declined     ?  ? Information is confidential and restricted. Go to Review Flowsheets to  unlock data.  ? ? ?Current Medications (verified) ?Outpatient Encounter Medications as of 09/13/2021  ?Medication Sig  ? acetaminophen (TYLENOL) 500 MG tablet Take by mouth.  ? aspirin 81 MG EC tablet Take 81 mg by mouth at bedtime. Swallow whole.  ? b complex vitamins tablet Take 1 tablet by mouth 2 (two) times daily.  ? BIOTIN 5000 PO Take 1 tablet by mouth 2 (two) times daily.  ? calcium carbonate (OSCAL) 1500 (600 Ca) MG TABS tablet Take by mouth.  ? Cholecalciferol (VITAMIN D3) 1.25 MG (50000 UT) CAPS Take 1 weekly for 12 weeks (Patient taking differently: 500 Units in the morning and at bedtime. Take 1 weekly for 12 weeks)  ? clindamycin (CLINDAGEL) 1 % gel Apply topically 2 (two) times daily.  ? Cyanocobalamin (VITAMIN B12 TR PO) Take 500 mcg by mouth at bedtime.   ? diclofenac Sodium (VOLTAREN) 1 % GEL Apply topically.  ? Ferrous Sulfate (IRON PO) Take 1 tablet by mouth 3 (three) times a week.  ? fluticasone (FLONASE) 50 MCG/ACT nasal spray SPRAY 2 SPRAYS INTO EACH NOSTRIL EVERY DAY  ? gabapentin (NEURONTIN) 300 MG capsule TAKE 1 CAPSULE BY MOUTH EVERY DAY AT NIGHT  ? lamoTRIgine (LAMICTAL) 100 MG tablet Take 1 tablet (100 mg total) by mouth daily.  ? loperamide (IMODIUM A-D) 2 MG tablet Take 2 mg by mouth as needed.   ? omeprazole (PRILOSEC) 40 MG capsule TAKE 1 CAPSULE BY MOUTH TWICE A DAY  ?  primidone (MYSOLINE) 50 MG tablet TAKE 2 TABLETS EVERY MORNING TAKE 1 TABLET AT NOON AND TAKE 1 TABLET AT SUPPER  ? sucralfate (CARAFATE) 1 g tablet Take 1 tablet twice a day. Do not take within 2 hours of any other medication.  ? triamcinolone (KENALOG) 0.025 % cream Apply to lip twice daily for 10 days.  ? venlafaxine XR (EFFEXOR-XR) 150 MG 24 hr capsule Take 1 capsule (150 mg total) by mouth daily.  ? [DISCONTINUED] acetaminophen (TYLENOL) 500 MG tablet Take by mouth.  ? [DISCONTINUED] diclofenac Sodium (VOLTAREN) 1 % GEL Apply 2 g topically 4 (four) times daily. Use as needed for joint pain   ? ?Facility-Administered Encounter Medications as of 09/13/2021  ?Medication  ? denosumab (PROLIA) injection 60 mg  ? ? ?Allergies (verified) ?Metformin  ? ?History: ?Past Medical History:  ?Diagnosis Date  ? Allergy   ? Anemia   ? Arthritis   ? Asthma   ? Back pain   ? Barrett's esophagus   ? Bladder infection   ? Cataract   ? Depression   ? Diabetes mellitus, type II (HCC)   ? Gallstones   ? Gastritis   ? GERD (gastroesophageal reflux disease)   ? Hyperlipidemia   ? Memory deficits   ? Osteoporosis 12/09/2017  ? PTSD (post-traumatic stress disorder)   ? Sleep apnea   ? Thyroid disease   ? Patient denies  ? ?Past Surgical History:  ?Procedure Laterality Date  ? CHOLECYSTECTOMY  1999  ? COLONOSCOPY    ? ESOPHAGOGASTRODUODENOSCOPY    ? GANGLION CYST EXCISION Left   ? GASTRIC BYPASS  2000  ? TONSILLECTOMY AND ADENOIDECTOMY    ? age 76  ? ?Family History  ?Problem Relation Age of Onset  ? Depression Mother   ? Heart disease Mother   ? Kidney disease Mother   ? Alcohol abuse Father   ? Tremor Father   ? Prostate cancer Father   ? Other Father   ?     Hypoglycemia  ? Heart disease Father   ? Asthma Father   ? Diabetes Paternal Grandmother   ? Other Daughter   ?     stillborn  ? Colon cancer Neg Hx   ? Esophageal cancer Neg Hx   ? Rectal cancer Neg Hx   ? Stomach cancer Neg Hx   ? Allergic rhinitis Neg Hx   ? Eczema Neg Hx   ? Urticaria Neg Hx   ? Immunodeficiency Neg Hx   ? Angioedema Neg Hx   ? ?Social History  ? ?Socioeconomic History  ? Marital status: Divorced  ?  Spouse name: Not on file  ? Number of children: 3  ? Years of education: Not on file  ? Highest education level: Not on file  ?Occupational History  ? Occupation: retired  ?Tobacco Use  ? Smoking status: Never  ? Smokeless tobacco: Never  ?Vaping Use  ? Vaping Use: Never used  ?Substance and Sexual Activity  ? Alcohol use: No  ?  Alcohol/week: 0.0 standard drinks  ? Drug use: No  ? Sexual activity: Never  ?Other Topics Concern  ? Not on file  ?Social History  Narrative  ? Married second time after a divorce  ? 1 son and 1 daughter living 1 daughter was stillborn  ? 3 caffeine (coffee)/day  ? Retired from [employer]  ? ?Social Determinants of Health  ? ?Financial Resource Strain: Low Risk   ? Difficulty of Paying Living Expenses: Not hard at  all  ?Food Insecurity: No Food Insecurity  ? Worried About Programme researcher, broadcasting/film/video in the Last Year: Never true  ? Ran Out of Food in the Last Year: Never true  ?Transportation Needs: No Transportation Needs  ? Lack of Transportation (Medical): No  ? Lack of Transportation (Non-Medical): No  ?Physical Activity: Sufficiently Active  ? Days of Exercise per Week: 5 days  ? Minutes of Exercise per Session: 30 min  ?Stress: No Stress Concern Present  ? Feeling of Stress : Not at all  ?Social Connections: Moderately Integrated  ? Frequency of Communication with Friends and Family: More than three times a week  ? Frequency of Social Gatherings with Friends and Family: More than three times a week  ? Attends Religious Services: More than 4 times per year  ? Active Member of Clubs or Organizations: Yes  ? Attends Banker Meetings: More than 4 times per year  ? Marital Status: Divorced  ? ? ?Tobacco Counseling ?Counseling given: Not Answered ? ? ?Clinical Intake: ? ?Pre-visit preparation completed: Yes ? ?Pain : No/denies pain ? ?  ? ?BMI - recorded: 26.57 ?Nutritional Status: BMI 25 -29 Overweight ?Nutritional Risks: None ?Diabetes: Yes ? ?How often do you need to have someone help you when you read instructions, pamphlets, or other written materials from your doctor or pharmacy?: 1 - Never ? ?Diabetic?Nutrition Risk Assessment: ? ?Has the patient had any N/V/D within the last 2 months?  No  ?Does the patient have any non-healing wounds?  No  ?Has the patient had any unintentional weight loss or weight gain?  No  ? ?Diabetes: ? ?Is the patient diabetic?  Yes  ?If diabetic, was a CBG obtained today?  No  ?Did the patient bring in their  glucometer from home?  No  Phone visit.  ?How often do you monitor your CBG's? A1C.  ? ?Financial Strains and Diabetes Management: ? ?Are you having any financial strains with the device, your supplies or your medication? No .

## 2021-09-13 NOTE — Patient Instructions (Signed)
Alexis [name] , ?Thank you for taking time to come for your Medicare Wellness Visit. I appreciate your ongoing commitment to your health goals. Please review the following plan we discussed and let me know if I can assist you in the future.  ? ?Screening recommendations/referrals: ?Colonoscopy: Done 03/25/2018 Repeat in 10 years ? ?Mammogram: Done 07/30/21 Repeat annually ? ?Bone Density: Done 02/28/2020 Repeat every 2 years ? ?Recommended yearly ophthalmology/optometry visit for glaucoma screening and checkup ?Recommended yearly dental visit for hygiene and checkup ? ?Vaccinations: ?Influenza vaccine: Done 02/26/2021 Repeat annually ? ?Pneumococcal vaccine: Done 12/01/2017 and 11/26/2018  ?Tdap vaccine: Done 12/01/2017 Repeat in 10 years ? ?Shingles vaccine: Done 03/03/2019 and 12/30/2018   ?Covid-19:Done 05/03/2020, 08/23/2019, 07/29/2019 ? ?Advanced directives: Advance directive discussed with you today. Even though you declined this today, please call our office should you change your mind, and we can give you the proper paperwork for you to fill out. ? ? ?Conditions/risks identified: Aim for 30 minutes of exercise or brisk walking, 6-8 glasses of water, and 5 servings of fruits and vegetables each day. ?KEEP UP THE GOOD WORK!! ? ?Next appointment: Follow up in one year for your annual wellness visit 2024. ? ? ?Preventive Care 8565 Years and Older, Female ?Preventive care refers to lifestyle choices and visits with your health care provider that can promote health and wellness. ?What does preventive care include? ?A yearly physical exam. This is also called an annual well check. ?Dental exams once or twice a year. ?Routine eye exams. Ask your health care provider how often you should have your eyes checked. ?Personal lifestyle choices, including: ?Daily care of your teeth and gums. ?Regular physical activity. ?Eating a healthy diet. ?Avoiding tobacco and drug use. ?Limiting alcohol use. ?Practicing safe sex. ?Taking low-dose  aspirin every day. ?Taking vitamin and mineral supplements as recommended by your health care provider. ?What happens during an annual well check? ?The services and screenings done by your health care provider during your annual well check will depend on your age, overall health, lifestyle risk factors, and family history of disease. ?Counseling  ?Your health care provider may ask you questions about your: ?Alcohol use. ?Tobacco use. ?Drug use. ?Emotional well-being. ?Home and relationship well-being. ?Sexual activity. ?Eating habits. ?History of falls. ?Memory and ability to understand (cognition). ?Work and work Astronomerenvironment. ?Reproductive health. ?Screening  ?You may have the following tests or measurements: ?Height, weight, and BMI. ?Blood pressure. ?Lipid and cholesterol levels. These may be checked every 5 years, or more frequently if you are over 76 years old. ?Skin check. ?Lung cancer screening. You may have this screening every year starting at age 76 if you have a 30-pack-year history of smoking and currently smoke or have quit within the past 15 years. ?Fecal occult blood test (FOBT) of the stool. You may have this test every year starting at age 76. ?Flexible sigmoidoscopy or colonoscopy. You may have a sigmoidoscopy every 5 years or a colonoscopy every 10 years starting at age 76. ?Hepatitis C blood test. ?Hepatitis B blood test. ?Sexually transmitted disease (STD) testing. ?Diabetes screening. This is done by checking your blood sugar (glucose) after you have not eaten for a while (fasting). You may have this done every 1-3 years. ?Bone density scan. This is done to screen for osteoporosis. You may have this done starting at age 265. ?Mammogram. This may be done every 1-2 years. Talk to your health care provider about how often you should have regular mammograms. ?Talk with your health  care provider about your test results, treatment options, and if necessary, the need for more tests. ?Vaccines  ?Your  health care provider may recommend certain vaccines, such as: ?Influenza vaccine. This is recommended every year. ?Tetanus, diphtheria, and acellular pertussis (Tdap, Td) vaccine. You may need a Td booster every 10 years. ?Zoster vaccine. You may need this after age 28. ?Pneumococcal 13-valent conjugate (PCV13) vaccine. One dose is recommended after age 8. ?Pneumococcal polysaccharide (PPSV23) vaccine. One dose is recommended after age 51. ?Talk to your health care provider about which screenings and vaccines you need and how often you need them. ?This information is not intended to replace advice given to you by your health care provider. Make sure you discuss any questions you have with your health care provider. ?Document Released: 06/30/2015 Document Revised: 02/21/2016 Document Reviewed: 04/04/2015 ?Elsevier Interactive Patient Education ? 2017 [employer] ? ?Fall Prevention in the Home ?Falls can cause injuries. They can happen to people of all ages. There are many things you can do to make your home safe and to help prevent falls. ?What can I do on the outside of my home? ?Regularly fix the edges of walkways and driveways and fix any cracks. ?Remove anything that might make you trip as you walk through a door, such as a raised step or threshold. ?Trim any bushes or trees on the path to your home. ?Use bright outdoor lighting. ?Clear any walking paths of anything that might make someone trip, such as rocks or tools. ?Regularly check to see if handrails are loose or broken. Make sure that both sides of any steps have handrails. ?Any raised decks and porches should have guardrails on the edges. ?Have any leaves, snow, or ice cleared regularly. ?Use sand or salt on walking paths during winter. ?Clean up any spills in your garage right away. This includes oil or grease spills. ?What can I do in the bathroom? ?Use night lights. ?Install grab bars by the toilet and in the tub and shower. Do not use towel bars as  grab bars. ?Use non-skid mats or decals in the tub or shower. ?If you need to sit down in the shower, use a plastic, non-slip stool. ?Keep the floor dry. Clean up any water that spills on the floor as soon as it happens. ?Remove soap buildup in the tub or shower regularly. ?Attach bath mats securely with double-sided non-slip rug tape. ?Do not have throw rugs and other things on the floor that can make you trip. ?What can I do in the bedroom? ?Use night lights. ?Make sure that you have a light by your bed that is easy to reach. ?Do not use any sheets or blankets that are too big for your bed. They should not hang down onto the floor. ?Have a firm chair that has side arms. You can use this for support while you get dressed. ?Do not have throw rugs and other things on the floor that can make you trip. ?What can I do in the kitchen? ?Clean up any spills right away. ?Avoid walking on wet floors. ?Keep items that you use a lot in easy-to-reach places. ?If you need to reach something above you, use a strong step stool that has a grab bar. ?Keep electrical cords out of the way. ?Do not use floor polish or wax that makes floors slippery. If you must use wax, use non-skid floor wax. ?Do not have throw rugs and other things on the floor that can make you trip. ?What can  I do with my stairs? ?Do not leave any items on the stairs. ?Make sure that there are handrails on both sides of the stairs and use them. Fix handrails that are broken or loose. Make sure that handrails are as long as the stairways. ?Check any carpeting to make sure that it is firmly attached to the stairs. Fix any carpet that is loose or worn. ?Avoid having throw rugs at the top or bottom of the stairs. If you do have throw rugs, attach them to the floor with carpet tape. ?Make sure that you have a light switch at the top of the stairs and the bottom of the stairs. If you do not have them, ask someone to add them for you. ?What else can I do to help prevent  falls? ?Wear shoes that: ?Do not have high heels. ?Have rubber bottoms. ?Are comfortable and fit you well. ?Are closed at the toe. Do not wear sandals. ?If you use a stepladder: ?Make sure that it is fully

## 2021-10-11 DIAGNOSIS — Z20822 Contact with and (suspected) exposure to covid-19: Secondary | ICD-10-CM | POA: Diagnosis not present

## 2021-10-15 DIAGNOSIS — Z20822 Contact with and (suspected) exposure to covid-19: Secondary | ICD-10-CM | POA: Diagnosis not present

## 2021-10-19 ENCOUNTER — Other Ambulatory Visit: Payer: Self-pay | Admitting: Family Medicine

## 2021-10-30 DIAGNOSIS — E119 Type 2 diabetes mellitus without complications: Secondary | ICD-10-CM | POA: Diagnosis not present

## 2021-10-30 DIAGNOSIS — E559 Vitamin D deficiency, unspecified: Secondary | ICD-10-CM | POA: Diagnosis not present

## 2021-10-30 DIAGNOSIS — E871 Hypo-osmolality and hyponatremia: Secondary | ICD-10-CM | POA: Diagnosis not present

## 2021-10-30 DIAGNOSIS — K219 Gastro-esophageal reflux disease without esophagitis: Secondary | ICD-10-CM | POA: Diagnosis not present

## 2021-11-14 ENCOUNTER — Telehealth (HOSPITAL_COMMUNITY): Payer: Medicare Other | Admitting: Psychiatry

## 2021-11-15 ENCOUNTER — Telehealth (HOSPITAL_COMMUNITY): Payer: Medicare Other | Admitting: Psychiatry

## 2021-11-20 ENCOUNTER — Encounter (HOSPITAL_COMMUNITY): Payer: Self-pay | Admitting: Psychiatry

## 2021-11-20 ENCOUNTER — Telehealth (HOSPITAL_BASED_OUTPATIENT_CLINIC_OR_DEPARTMENT_OTHER): Payer: Medicare Other | Admitting: Psychiatry

## 2021-11-20 DIAGNOSIS — F4310 Post-traumatic stress disorder, unspecified: Secondary | ICD-10-CM

## 2021-11-20 DIAGNOSIS — F331 Major depressive disorder, recurrent, moderate: Secondary | ICD-10-CM | POA: Diagnosis not present

## 2021-11-20 MED ORDER — LAMOTRIGINE 100 MG PO TABS: 100.0000 mg | ORAL_TABLET | Freq: Every day | ORAL | 0 refills | Status: DC

## 2021-11-20 MED ORDER — VENLAFAXINE HCL ER 150 MG PO CP24: 150.0000 mg | ORAL_CAPSULE | Freq: Every day | ORAL | 0 refills | Status: DC
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
## 2021-11-29 DIAGNOSIS — H04123 Dry eye syndrome of bilateral lacrimal glands: Secondary | ICD-10-CM | POA: Diagnosis not present

## 2021-11-29 DIAGNOSIS — H40013 Open angle with borderline findings, low risk, bilateral: Secondary | ICD-10-CM | POA: Diagnosis not present

## 2021-12-14 ENCOUNTER — Ambulatory Visit (INDEPENDENT_AMBULATORY_CARE_PROVIDER_SITE_OTHER): Payer: Medicare Other | Admitting: Family

## 2021-12-14 VITALS — BP 130/78 | HR 67 | Temp 98.3°F | Resp 16 | Ht 63.0 in | Wt 165.0 lb

## 2021-12-14 DIAGNOSIS — B351 Tinea unguium: Secondary | ICD-10-CM | POA: Diagnosis not present

## 2021-12-14 NOTE — Patient Instructions (Signed)

## 2021-12-14 NOTE — Progress Notes (Signed)
Alexis [name] is a 76 y.o. female with the following history as recorded in [employer]:  Patient Active Problem List   Diagnosis Date Noted   Aspirin long-term use 07/19/2021   Depressive disorder 10/18/2020   Gastroesophageal reflux disease 10/18/2020   Occlusion and stenosis of bilateral carotid arteries 10/18/2020   Type II or unspecified type diabetes mellitus without mention of complication, not stated as uncontrolled 10/18/2020   Vitamin D deficiency 10/18/2020   Osteoporosis 12/09/2017   Syncope 07/17/2017   Shortness of breath 07/17/2017   Hyponatremia 35/46/5681   Lichen simplex chronicus 03/05/2017   Type 2 diabetes mellitus without complication, without long-term current use of insulin ([place]) 03/05/2017   Perianal lesion 01/31/2017   Bilateral carotid artery stenosis 12/12/2016   Restless legs 12/11/2016   History of gastric bypass 12/11/2016   PTSD (post-traumatic stress disorder) 12/11/2016   Bariatric surgery status 12/11/2016   Risk for falls 12/10/2016   Other dysphagia 08/19/2016   Right foot drop 08/19/2016   Chronic vulvitis 12/25/2015   Microcalcification of left breast on mammogram 05/11/2015   Tension type headache 06/14/2013   Essential tremor 04/13/2013   Hypersomnia 04/13/2013    Current Outpatient Medications  Medication Sig Dispense Refill   acetaminophen (TYLENOL) 500 MG tablet Take by mouth.     aspirin 81 MG EC tablet Take 81 mg by mouth at bedtime. Swallow whole.     b complex vitamins tablet Take 1 tablet by mouth 2 (two) times daily.     BIOTIN 5000 PO Take 1 tablet by mouth 2 (two) times daily.     calcium carbonate (OSCAL) 1500 (600 Ca) MG TABS tablet Take by mouth.     Cholecalciferol (VITAMIN D3) 1.25 MG (50000 UT) CAPS Take 1 weekly for 12 weeks (Patient taking differently: 500 Units in the morning and at bedtime. Take 1 weekly for 12 weeks) 12 capsule 4   clindamycin (CLINDAGEL) 1 % gel Apply topically 2 (two) times daily. 30 g 0    Cyanocobalamin (VITAMIN B12 TR PO) Take 500 mcg by mouth at bedtime.      diclofenac Sodium (VOLTAREN) 1 % GEL Apply topically.     Ferrous Sulfate (IRON PO) Take 1 tablet by mouth 3 (three) times a week.     fluticasone (FLONASE) 50 MCG/ACT nasal spray SPRAY 2 SPRAYS INTO EACH NOSTRIL EVERY DAY 48 mL 1   gabapentin (NEURONTIN) 300 MG capsule TAKE 1 CAPSULE BY MOUTH EVERY DAY AT NIGHT 90 capsule 3   lamoTRIgine (LAMICTAL) 100 MG tablet Take 1 tablet (100 mg total) by mouth daily. 90 tablet 0   loperamide (IMODIUM A-D) 2 MG tablet Take 2 mg by mouth as needed.      omeprazole (PRILOSEC) 40 MG capsule TAKE 1 CAPSULE BY MOUTH TWICE A DAY 180 capsule 1   primidone (MYSOLINE) 50 MG tablet TAKE 2 TABLETS EVERY MORNING TAKE 1 TABLET AT NOON AND TAKE 1 TABLET AT SUPPER 360 tablet 3   sucralfate (CARAFATE) 1 g tablet Take 1 tablet twice a day. Do not take within 2 hours of any other medication. 60 tablet 1   triamcinolone (KENALOG) 0.025 % cream Apply to lip twice daily for 10 days. 30 g 0   venlafaxine XR (EFFEXOR-XR) 150 MG 24 hr capsule Take 1 capsule (150 mg total) by mouth daily. 90 capsule 0   Current Facility-Administered Medications  Medication Dose Route Frequency Provider Last Rate Last Admin   denosumab (PROLIA) injection 60 mg  60 mg Subcutaneous  Once [name], Gay Filler, MD        Allergies: Metformin  Past Medical History:  Diagnosis Date   Allergy    Anemia    Arthritis    Asthma    Back pain    Barrett's esophagus    Bladder infection    Cataract    Depression    Diabetes mellitus, type II ([facility])    Gallstones    Gastritis    GERD (gastroesophageal reflux disease)    Hyperlipidemia    Memory deficits    Osteoporosis 12/09/2017   PTSD (post-traumatic stress disorder)    Sleep apnea    Thyroid disease    Patient denies    Past Surgical History:  Procedure Laterality Date   CHOLECYSTECTOMY  1999   COLONOSCOPY     ESOPHAGOGASTRODUODENOSCOPY     GANGLION CYST EXCISION  Left    GASTRIC BYPASS  2000   TONSILLECTOMY AND ADENOIDECTOMY     age 70    Family History  Problem Relation Age of Onset   Depression Mother    Heart disease Mother    Kidney disease Mother    Alcohol abuse Father    Tremor Father    Prostate cancer Father    Other Father        Hypoglycemia   Heart disease Father    Asthma Father    Diabetes Paternal Grandmother    Other Daughter        stillborn   Colon cancer Neg Hx    Esophageal cancer Neg Hx    Rectal cancer Neg Hx    Stomach cancer Neg Hx    Allergic rhinitis Neg Hx    Eczema Neg Hx    Urticaria Neg Hx    Immunodeficiency Neg Hx    Angioedema Neg Hx     Social History   Tobacco Use   Smoking status: Never   Smokeless tobacco: Never  Substance Use Topics   Alcohol use: No    Alcohol/week: 0.0 standard drinks of alcohol    Subjective:   Notes she has had thickened nails/ suspected nail fungus for the past 4-5 years; feels that nails have been more bothersome in the past 6 months; would like to discuss treatment options; does get regular pedicures; is adamant that she does not want to see podiatrist to discuss simply removing the nails.     Objective:  Vitals:   12/14/21 1531  BP: 130/78  Pulse: 67  Resp: 16  Temp: 98.3 F (36.8 C)  TempSrc: Oral  SpO2: 96%  Weight: 165 lb (74.8 kg)  Height: 5' 3" (1.6 m)    General: Well developed, well nourished, in no acute distress  Skin : Warm and dry. Yellow, thickened nails noted on bilateral feet 1st and 2nd toes Head: Normocephalic and atraumatic  Lungs: Respirations unlabored;  Neurologic: Alert and oriented; speech intact; face symmetrical; moves all extremities well; CNII-XII intact without focal deficit   Assessment:  1. Nail fungus     Plan:   Update baseline labs today; will consider trial of Lamisil 250 mg qd x 12 weeks; risks/ benefits discussed; she understands she will need to get her LFTs re-check with her Pcp at [place] in September.   No  follow-ups on file.  Orders Placed This Encounter  Procedures   Comp Met (CMET)    Requested Prescriptions    No prescriptions requested or ordered in this encounter

## 2021-12-15 LAB — COMPREHENSIVE METABOLIC PANEL
AG Ratio: 1.7 (calc) (ref 1.0–2.5)
ALT: 26 U/L (ref 6–29)
AST: 24 U/L (ref 10–35)
Albumin: 4 g/dL (ref 3.6–5.1)
Alkaline phosphatase (APISO): 50 U/L (ref 37–153)
BUN: 14 mg/dL (ref 7–25)
CO2: 24 mmol/L (ref 20–32)
Calcium: 8.3 mg/dL — ABNORMAL LOW (ref 8.6–10.4)
Chloride: 99 mmol/L (ref 98–110)
Creat: 0.79 mg/dL (ref 0.60–1.00)
Globulin: 2.3 g/dL (calc) (ref 1.9–3.7)
Glucose, Bld: 109 mg/dL — ABNORMAL HIGH (ref 65–99)
Potassium: 5 mmol/L (ref 3.5–5.3)
Sodium: 132 mmol/L — ABNORMAL LOW (ref 135–146)
Total Bilirubin: 0.4 mg/dL (ref 0.2–1.2)
Total Protein: 6.3 g/dL (ref 6.1–8.1)

## 2021-12-20 ENCOUNTER — Telehealth: Payer: Self-pay | Admitting: Family Medicine

## 2021-12-20 NOTE — Telephone Encounter (Signed)
Pt stated she was advised by Vernona Rieger she would check with pcp about medication for nail fungus. She would like to know what the status of that is.

## 2021-12-20 NOTE — Telephone Encounter (Signed)
Lab results 12/17/21:  " Olive Bass, FNP  12/17/2021  2:54 PM EDT     Her liver functions are normal. However, I have reviewed her chart a little more than when we last talked and I want to talk to Dr. Patsy Lager when she is back in the office next week. I need to get more clarification on her kidney issues before we start Lamisil. We will be back in touch.

## 2021-12-21 NOTE — Telephone Encounter (Signed)
Pt aware and voices understanding.   

## 2021-12-25 ENCOUNTER — Other Ambulatory Visit: Payer: Self-pay | Admitting: Family

## 2021-12-25 MED ORDER — TERBINAFINE HCL 250 MG PO TABS: 250.0000 mg | ORAL_TABLET | Freq: Every day | ORAL | 1 refills | Status: DC

## 2022-01-16 NOTE — Telephone Encounter (Signed)
Prolia VOB initiated via [email or web address]  Last Prolia inj 08/21/21 Next Prolia inj due 02/22/22

## 2022-01-25 NOTE — Telephone Encounter (Signed)
Pt ready for scheduling on or after 02/22/22   Out-of-pocket cost due at time of visit: $0.00   Primary: Medicare Prolia co-insurance: 20% (approximately $276) Admin fee co-insurance: 20% (approximately $25)   Secondary: [employer] Medicare Supp Prolia co-insurance: Covers Medicare Part B co-insurance and deductible.  Admin fee co-insurance: Covers Medicare Part B co-insurance and deductible.    Deductible: $226 of $226 met (covered by secondary)   Prior Auth: not required PA# Valid:    ** This summary of benefits is an estimation of the patient's out-of-pocket cost. Exact cost may vary based on individual plan coverage.

## 2022-01-29 NOTE — Telephone Encounter (Signed)
Patient already scheduled for 02/22/22

## 2022-02-18 ENCOUNTER — Other Ambulatory Visit: Payer: Self-pay | Admitting: Family

## 2022-02-19 ENCOUNTER — Telehealth (HOSPITAL_BASED_OUTPATIENT_CLINIC_OR_DEPARTMENT_OTHER): Payer: Medicare Other | Admitting: Psychiatry

## 2022-02-19 ENCOUNTER — Encounter (HOSPITAL_COMMUNITY): Payer: Self-pay | Admitting: Psychiatry

## 2022-02-19 DIAGNOSIS — F4310 Post-traumatic stress disorder, unspecified: Secondary | ICD-10-CM

## 2022-02-19 DIAGNOSIS — F331 Major depressive disorder, recurrent, moderate: Secondary | ICD-10-CM

## 2022-02-19 MED ORDER — LAMOTRIGINE 100 MG PO TABS: 100.0000 mg | ORAL_TABLET | Freq: Every day | ORAL | 0 refills | Status: DC

## 2022-02-19 MED ORDER — VENLAFAXINE HCL ER 150 MG PO CP24: 150.0000 mg | ORAL_CAPSULE | Freq: Every day | ORAL | 0 refills | Status: DC
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
## 2022-02-22 ENCOUNTER — Ambulatory Visit (INDEPENDENT_AMBULATORY_CARE_PROVIDER_SITE_OTHER): Payer: Medicare Other

## 2022-02-22 DIAGNOSIS — M810 Age-related osteoporosis without current pathological fracture: Secondary | ICD-10-CM

## 2022-02-22 DIAGNOSIS — Z23 Encounter for immunization: Secondary | ICD-10-CM

## 2022-02-22 MED ORDER — DENOSUMAB 60 MG/ML ~~LOC~~ SOSY
60.0000 mg | PREFILLED_SYRINGE | Freq: Once | SUBCUTANEOUS | Status: AC
  Administered 2022-02-22: 60 mg via SUBCUTANEOUS

## 2022-02-22 NOTE — Progress Notes (Signed)
Alexis [name] is a 76 y.o. female presents to the office today for Prolia and HD flu injections, per physician's orders. Original order: 02/28/2020- Prolia Prolia 60 mg,,  SQ (route) was administered L arm posterior today. Patient tolerated injection. Flu Vaccine Quad HD 0.5 ML was administered IM R deltoid (location) today. Patient tolerated injection.  Patient due for follow up labs/provider appt: No. Date due: Pt has a pending appointment on 03/04/2022.   Creft, Feliberto Harts

## 2022-03-01 NOTE — Progress Notes (Unsigned)
[facility] [address], [address] [place], [place] [address] [phone]  Date:  03/04/2022   Name:  Alexis [name]   DOB:  06-17-1946   MRN:  [id number]  PCP:  Pearline Cables, MD    Chief Complaint: No chief complaint on file.   History of Present Illness:  Alexis [name] is a 76 y.o. very pleasant female patient who presents with the following:  Pt seen today for periodic follow-up Last seen by myself in March  History of diet-controlled diabetes, gastric bypass, carotid artery stenosis, osteoporosis, benign tremor, depression, foot drop, lichen simplex chronicus At our last visit she was thinking of coming off her gabapentin  Flu shot  Due for urine microalbumin  Some labs done in June- CMP Lab Results  Component Value Date   HGBA1C 6.2 08/27/2021   She has a nephrologist - most recent visit in May: 1. Hyponatremia. -Laboratory studies obtained at the time of her last office visit on December 20, 2021, had shown the serum sodium level to be 138 which compared with prior value of 133 on February 26, 2021 and in Oct 18, 2020. At that time also in January 2023, the serum uric acid was 4 while the serum osmolality was 281. More recently, the serum sodium level was noted to be 132 on August 27, 2021. Reviewed with patient on importance to control her free water intake. Of note she continues to be on venlafaxine which may actually contribute to hyponatremia. 2. Type 2 diabetes mellitus. -Latest hemoglobin A1c was 6.2% on June 22, 2021. Continue dietary control. 3. Vitamin D deficiency. -She continues to be on cholecalciferol/vitamin D 1000 units once a day. Latest 25-hydroxy vitamin D level was 48.69 in September 2022. 4. Gastroesophageal reflux disease. -This is managed with omeprazole 40 mg twice a day. 5. Depression. -Management as per psychiatry.  Patient Active Problem List   Diagnosis Date Noted   Aspirin long-term  use 07/19/2021   Depressive disorder 10/18/2020   Gastroesophageal reflux disease 10/18/2020   Occlusion and stenosis of bilateral carotid arteries 10/18/2020   Type II or unspecified type diabetes mellitus without mention of complication, not stated as uncontrolled 10/18/2020   Vitamin D deficiency 10/18/2020   Osteoporosis 12/09/2017   Syncope 07/17/2017   Shortness of breath 07/17/2017   Hyponatremia 03/05/2017   Lichen simplex chronicus 03/05/2017   Type 2 diabetes mellitus without complication, without long-term current use of insulin ([facility]) 03/05/2017   Perianal lesion 01/31/2017   Bilateral carotid artery stenosis 12/12/2016   Restless legs 12/11/2016   History of gastric bypass 12/11/2016   PTSD (post-traumatic stress disorder) 12/11/2016   Bariatric surgery status 12/11/2016   Risk for falls 12/10/2016   Other dysphagia 08/19/2016   Right foot drop 08/19/2016   Chronic vulvitis 12/25/2015   Microcalcification of left breast on mammogram 05/11/2015   Tension type headache 06/14/2013   Essential tremor 04/13/2013   Hypersomnia 04/13/2013    Past Medical History:  Diagnosis Date   Allergy    Anemia    Arthritis    Asthma    Back pain    Barrett's esophagus    Bladder infection    Cataract    Depression    Diabetes mellitus, type II ([facility])    Gallstones    Gastritis    GERD (gastroesophageal reflux disease)    Hyperlipidemia    Memory deficits    Osteoporosis 12/09/2017   PTSD (post-traumatic  stress disorder)    Sleep apnea    Thyroid disease    Patient denies    Past Surgical History:  Procedure Laterality Date   CHOLECYSTECTOMY  1999   COLONOSCOPY     ESOPHAGOGASTRODUODENOSCOPY     GANGLION CYST EXCISION Left    GASTRIC BYPASS  2000   TONSILLECTOMY AND ADENOIDECTOMY     age 36    Social History   Tobacco Use   Smoking status: Never   Smokeless tobacco: Never  Vaping Use   Vaping Use: Never used  Substance Use Topics   Alcohol use: No     Alcohol/week: 0.0 standard drinks of alcohol   Drug use: No    Family History  Problem Relation Age of Onset   Depression Mother    Heart disease Mother    Kidney disease Mother    Alcohol abuse Father    Tremor Father    Prostate cancer Father    Other Father        Hypoglycemia   Heart disease Father    Asthma Father    Diabetes Paternal Grandmother    Other Daughter        stillborn   Colon cancer Neg Hx    Esophageal cancer Neg Hx    Rectal cancer Neg Hx    Stomach cancer Neg Hx    Allergic rhinitis Neg Hx    Eczema Neg Hx    Urticaria Neg Hx    Immunodeficiency Neg Hx    Angioedema Neg Hx     Allergies  Allergen Reactions   Metformin Diarrhea    Medication list has been reviewed and updated.  Current Outpatient Medications on File Prior to Visit  Medication Sig Dispense Refill   acetaminophen (TYLENOL) 500 MG tablet Take by mouth.     aspirin 81 MG EC tablet Take 81 mg by mouth at bedtime. Swallow whole.     b complex vitamins tablet Take 1 tablet by mouth 2 (two) times daily.     BIOTIN 5000 PO Take 1 tablet by mouth 2 (two) times daily.     calcium carbonate (OSCAL) 1500 (600 Ca) MG TABS tablet Take by mouth.     Cholecalciferol (VITAMIN D3) 1.25 MG (50000 UT) CAPS Take 1 weekly for 12 weeks (Patient taking differently: 500 Units in the morning and at bedtime. Take 1 weekly for 12 weeks) 12 capsule 4   clindamycin (CLINDAGEL) 1 % gel Apply topically 2 (two) times daily. 30 g 0   Cyanocobalamin (VITAMIN B12 TR PO) Take 500 mcg by mouth at bedtime.      diclofenac Sodium (VOLTAREN) 1 % GEL Apply topically.     Ferrous Sulfate (IRON PO) Take 1 tablet by mouth 3 (three) times a week.     fluticasone (FLONASE) 50 MCG/ACT nasal spray SPRAY 2 SPRAYS INTO EACH NOSTRIL EVERY DAY 48 mL 1   gabapentin (NEURONTIN) 300 MG capsule TAKE 1 CAPSULE BY MOUTH EVERY DAY AT NIGHT 90 capsule 3   lamoTRIgine (LAMICTAL) 100 MG tablet Take 1 tablet (100 mg total) by mouth daily. 90  tablet 0   loperamide (IMODIUM A-D) 2 MG tablet Take 2 mg by mouth as needed.      omeprazole (PRILOSEC) 40 MG capsule TAKE 1 CAPSULE BY MOUTH TWICE A DAY 180 capsule 1   primidone (MYSOLINE) 50 MG tablet TAKE 2 TABLETS EVERY MORNING TAKE 1 TABLET AT NOON AND TAKE 1 TABLET AT SUPPER 360 tablet 3   sucralfate (CARAFATE)  1 g tablet Take 1 tablet twice a day. Do not take within 2 hours of any other medication. 60 tablet 1   terbinafine (LAMISIL) 250 MG tablet TAKE 1 TABLET BY MOUTH EVERY DAY 30 tablet 0   triamcinolone (KENALOG) 0.025 % cream Apply to lip twice daily for 10 days. 30 g 0   venlafaxine XR (EFFEXOR-XR) 150 MG 24 hr capsule Take 1 capsule (150 mg total) by mouth daily. 90 capsule 0   Current Facility-Administered Medications on File Prior to Visit  Medication Dose Route Frequency Provider Last Rate Last Admin   denosumab (PROLIA) injection 60 mg  60 mg Subcutaneous Once Kyasia Steuck, Gay Filler, MD        Review of Systems:  As per HPI- otherwise negative.   Physical Examination: There were no vitals filed for this visit. There were no vitals filed for this visit. There is no height or weight on file to calculate BMI. Ideal Body Weight:    GEN: no acute distress. HEENT: Atraumatic, Normocephalic.  Ears and Nose: No external deformity. CV: RRR, No M/G/R. No JVD. No thrill. No extra heart sounds. PULM: CTA B, no wheezes, crackles, rhonchi. No retractions. No resp. distress. No accessory muscle use. ABD: S, NT, ND, +BS. No rebound. No HSM. EXTR: No c/c/e PSYCH: Normally interactive. Conversant.  Foot exam:   Assessment and Plan: ***  Signed Lamar Blinks, MD

## 2022-03-01 NOTE — Patient Instructions (Incomplete)
Good to see you today- I will be in touch with your labs asap- will leave a copy for you to pick up on Wednesday!    Please stop by imaging on the ground floor to ask about doing your bone density test   Assuming all is well please see me in about 6 months

## 2022-03-04 ENCOUNTER — Ambulatory Visit (INDEPENDENT_AMBULATORY_CARE_PROVIDER_SITE_OTHER): Payer: Medicare Other | Admitting: Family Medicine

## 2022-03-04 VITALS — BP 112/72 | HR 66 | Temp 97.8°F | Resp 18 | Ht 63.0 in | Wt 155.2 lb

## 2022-03-04 DIAGNOSIS — M810 Age-related osteoporosis without current pathological fracture: Secondary | ICD-10-CM | POA: Diagnosis not present

## 2022-03-04 DIAGNOSIS — E119 Type 2 diabetes mellitus without complications: Secondary | ICD-10-CM | POA: Diagnosis not present

## 2022-03-04 LAB — BASIC METABOLIC PANEL
BUN: 14 mg/dL (ref 6–23)
CO2: 28 mEq/L (ref 19–32)
Calcium: 8.9 mg/dL (ref 8.4–10.5)
Chloride: 98 mEq/L (ref 96–112)
Creatinine, Ser: 0.79 mg/dL (ref 0.40–1.20)
GFR: 72.58 mL/min (ref >60.00)
Glucose, Bld: 122 mg/dL — ABNORMAL HIGH (ref 70–99)
Potassium: 4.7 mEq/L (ref 3.5–5.1)
Sodium: 134 mEq/L — ABNORMAL LOW (ref 135–145)

## 2022-03-04 LAB — HEMOGLOBIN A1C: Hgb A1c MFr Bld: 6.5 % (ref 4.6–6.5)

## 2022-03-08 DIAGNOSIS — E871 Hypo-osmolality and hyponatremia: Secondary | ICD-10-CM | POA: Diagnosis not present

## 2022-03-08 DIAGNOSIS — E559 Vitamin D deficiency, unspecified: Secondary | ICD-10-CM | POA: Diagnosis not present

## 2022-03-08 DIAGNOSIS — K219 Gastro-esophageal reflux disease without esophagitis: Secondary | ICD-10-CM | POA: Diagnosis not present

## 2022-03-08 DIAGNOSIS — R309 Painful micturition, unspecified: Secondary | ICD-10-CM | POA: Diagnosis not present

## 2022-03-08 DIAGNOSIS — E119 Type 2 diabetes mellitus without complications: Secondary | ICD-10-CM | POA: Diagnosis not present

## 2022-03-21 ENCOUNTER — Ambulatory Visit (HOSPITAL_BASED_OUTPATIENT_CLINIC_OR_DEPARTMENT_OTHER)
Admission: RE | Admit: 2022-03-21 | Discharge: 2022-03-21 | Disposition: A | Discharge status: Home / Self Care | Payer: Medicare Other | Source: Ambulatory Visit | Attending: Family Medicine | Admitting: Family Medicine

## 2022-03-21 DIAGNOSIS — M810 Age-related osteoporosis without current pathological fracture: Secondary | ICD-10-CM | POA: Insufficient documentation

## 2022-03-22 ENCOUNTER — Other Ambulatory Visit: Payer: Self-pay | Admitting: Family Medicine

## 2022-03-22 DIAGNOSIS — M810 Age-related osteoporosis without current pathological fracture: Secondary | ICD-10-CM

## 2022-03-22 NOTE — Progress Notes (Signed)
Called patient- she has been on Prolia for about 2 years but unfortunately is still losing bone mass Will refer to endocrinology to discuss other options

## 2022-03-28 ENCOUNTER — Other Ambulatory Visit: Payer: Self-pay | Admitting: Family Medicine

## 2022-03-28 DIAGNOSIS — G250 Essential tremor: Secondary | ICD-10-CM

## 2022-04-25 ENCOUNTER — Other Ambulatory Visit: Payer: Self-pay | Admitting: Family Medicine

## 2022-05-21 ENCOUNTER — Telehealth (HOSPITAL_BASED_OUTPATIENT_CLINIC_OR_DEPARTMENT_OTHER): Payer: Medicare Other | Admitting: Psychiatry

## 2022-05-21 ENCOUNTER — Encounter (HOSPITAL_COMMUNITY): Payer: Self-pay | Admitting: Psychiatry

## 2022-05-21 DIAGNOSIS — F331 Major depressive disorder, recurrent, moderate: Secondary | ICD-10-CM | POA: Diagnosis not present

## 2022-05-21 DIAGNOSIS — F4310 Post-traumatic stress disorder, unspecified: Secondary | ICD-10-CM

## 2022-05-21 MED ORDER — VENLAFAXINE HCL ER 150 MG PO CP24: 150.0000 mg | ORAL_CAPSULE | Freq: Every day | ORAL | 0 refills | Status: DC

## 2022-05-21 MED ORDER — LAMOTRIGINE 100 MG PO TABS: 100.0000 mg | ORAL_TABLET | Freq: Every day | ORAL | 0 refills | Status: DC
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
## 2022-06-03 DIAGNOSIS — E119 Type 2 diabetes mellitus without complications: Secondary | ICD-10-CM | POA: Diagnosis not present

## 2022-06-03 DIAGNOSIS — H524 Presbyopia: Secondary | ICD-10-CM | POA: Diagnosis not present

## 2022-06-03 DIAGNOSIS — H26492 Other secondary cataract, left eye: Secondary | ICD-10-CM | POA: Diagnosis not present

## 2022-06-03 DIAGNOSIS — H40013 Open angle with borderline findings, low risk, bilateral: Secondary | ICD-10-CM | POA: Diagnosis not present

## 2022-06-03 DIAGNOSIS — Z961 Presence of intraocular lens: Secondary | ICD-10-CM | POA: Diagnosis not present

## 2022-06-03 DIAGNOSIS — H04123 Dry eye syndrome of bilateral lacrimal glands: Secondary | ICD-10-CM | POA: Diagnosis not present

## 2022-06-03 DIAGNOSIS — H43813 Vitreous degeneration, bilateral: Secondary | ICD-10-CM | POA: Diagnosis not present

## 2022-06-03 LAB — HM DIABETES EYE EXAM

## 2022-06-19 NOTE — Progress Notes (Unsigned)
[facility] [address] [place], [place] [address] [phone]  Date:  06/20/2022   Name:  Alexis [name]   DOB:  1946/02/14   MRN:  [id number]  PCP:  Pearline Cables, MD    Chief Complaint: URI (runny nose, sinus drainage, headache for 3-4 days, her voice has come and gone as well. - covid neg 06/19/22)   History of Present Illness:  Alexis [name] is a 77 y.o. very pleasant female patient who presents with the following:  Patient seen today with concern of illness for 2-3 days Most recent visit with myself was in September for routine follow-up History of diet-controlled diabetes, gastric bypass, carotid artery stenosis, osteoporosis, benign tremor, depression, foot drop, lichen simplex chronicus  She has noted symptoms of sinus pressure and pain, headache, runny nose, some cough due to sinus drainage she thinks No fever noted No particular body aches or chills No Gi symptoms   She tested for COVID at home, negative-she notes her daughter is still concerned she could have COVID and wanted her to come in for testing  She does not feel like she has the flu -she does not feel that bad  Patient Active Problem List   Diagnosis Date Noted   Aspirin long-term use 07/19/2021   Depressive disorder 10/18/2020   Gastroesophageal reflux disease 10/18/2020   Occlusion and stenosis of bilateral carotid arteries 10/18/2020   Type II or unspecified type diabetes mellitus without mention of complication, not stated as uncontrolled 10/18/2020   Vitamin D deficiency 10/18/2020   Osteoporosis 12/09/2017   Syncope 07/17/2017   Shortness of breath 07/17/2017   Hyponatremia 03/05/2017   Lichen simplex chronicus 03/05/2017   Type 2 diabetes mellitus without complication, without long-term current use of insulin (HCC) 03/05/2017   Perianal lesion 01/31/2017   Bilateral carotid artery stenosis 12/12/2016   Restless legs  12/11/2016   History of gastric bypass 12/11/2016   PTSD (post-traumatic stress disorder) 12/11/2016   Bariatric surgery status 12/11/2016   Risk for falls 12/10/2016   Other dysphagia 08/19/2016   Right foot drop 08/19/2016   Chronic vulvitis 12/25/2015   Microcalcification of left breast on mammogram 05/11/2015   Tension type headache 06/14/2013   Essential tremor 04/13/2013   Hypersomnia 04/13/2013    Past Medical History:  Diagnosis Date   Allergy    Anemia    Arthritis    Asthma    Back pain    Barrett's esophagus    Bladder infection    Cataract    Depression    Diabetes mellitus, type II (HCC)    Gallstones    Gastritis    GERD (gastroesophageal reflux disease)    Hyperlipidemia    Memory deficits    Osteoporosis 12/09/2017   PTSD (post-traumatic stress disorder)    Sleep apnea    Thyroid disease    Patient denies    Past Surgical History:  Procedure Laterality Date   CHOLECYSTECTOMY  1999   COLONOSCOPY     ESOPHAGOGASTRODUODENOSCOPY     GANGLION CYST EXCISION Left    GASTRIC BYPASS  2000   TONSILLECTOMY AND ADENOIDECTOMY     age 6    Social History   Tobacco Use   Smoking status: Never   Smokeless tobacco: Never  Vaping Use   Vaping Use: Never used  Substance Use Topics   Alcohol use: No    Alcohol/week: 0.0 standard drinks of alcohol  Drug use: No    Family History  Problem Relation Age of Onset   Depression Mother    Heart disease Mother    Kidney disease Mother    Alcohol abuse Father    Tremor Father    Prostate cancer Father    Other Father        Hypoglycemia   Heart disease Father    Asthma Father    Diabetes Paternal Grandmother    Other Daughter        stillborn   Colon cancer Neg Hx    Esophageal cancer Neg Hx    Rectal cancer Neg Hx    Stomach cancer Neg Hx    Allergic rhinitis Neg Hx    Eczema Neg Hx    Urticaria Neg Hx    Immunodeficiency Neg Hx    Angioedema Neg Hx     Allergies  Allergen Reactions    Metformin Diarrhea    Medication list has been reviewed and updated.  Current Outpatient Medications on File Prior to Visit  Medication Sig Dispense Refill   acetaminophen (TYLENOL) 500 MG tablet Take by mouth.     aspirin 81 MG EC tablet Take 81 mg by mouth at bedtime. Swallow whole.     b complex vitamins tablet Take 1 tablet by mouth 2 (two) times daily.     BIOTIN 5000 PO Take 1 tablet by mouth 2 (two) times daily.     calcium carbonate (OSCAL) 1500 (600 Ca) MG TABS tablet Take by mouth.     Cholecalciferol (VITAMIN D3) 1.25 MG (50000 UT) CAPS Take 1 weekly for 12 weeks (Patient taking differently: 500 Units in the morning and at bedtime. Take 1 weekly for 12 weeks) 12 capsule 4   clindamycin (CLINDAGEL) 1 % gel Apply topically 2 (two) times daily. 30 g 0   Cyanocobalamin (VITAMIN B12 TR PO) Take 500 mcg by mouth at bedtime.      cyanocobalamin 1000 MCG tablet Take 1,000 mcg by mouth daily.     diclofenac Sodium (VOLTAREN) 1 % GEL Apply topically.     Ferrous Sulfate (IRON PO) Take 1 tablet by mouth 3 (three) times a week.     fluticasone (FLONASE) 50 MCG/ACT nasal spray SPRAY 2 SPRAYS INTO EACH NOSTRIL EVERY DAY 48 mL 1   gabapentin (NEURONTIN) 300 MG capsule TAKE 1 CAPSULE BY MOUTH EVERY DAY AT NIGHT 90 capsule 3   lamoTRIgine (LAMICTAL) 100 MG tablet Take 1 tablet (100 mg total) by mouth daily. 90 tablet 0   loperamide (IMODIUM A-D) 2 MG tablet Take 2 mg by mouth as needed.      omeprazole (PRILOSEC) 40 MG capsule TAKE 1 CAPSULE BY MOUTH TWICE A DAY 180 capsule 1   primidone (MYSOLINE) 50 MG tablet TAKE 2 TABLETS EVERY MORNING TAKE 1 TABLET AT NOON AND TAKE 1 TABLET AT SUPPER 360 tablet 1   sucralfate (CARAFATE) 1 g tablet Take 1 tablet twice a day. Do not take within 2 hours of any other medication. 60 tablet 1   terbinafine (LAMISIL) 250 MG tablet TAKE 1 TABLET BY MOUTH EVERY DAY 30 tablet 0   triamcinolone (KENALOG) 0.025 % cream Apply to lip twice daily for 10 days. 30 g 0    venlafaxine XR (EFFEXOR-XR) 150 MG 24 hr capsule Take 1 capsule (150 mg total) by mouth daily. 90 capsule 0   Current Facility-Administered Medications on File Prior to Visit  Medication Dose Route Frequency Provider Last Rate Last Admin  denosumab (PROLIA) injection 60 mg  60 mg Subcutaneous Once Neema Fluegge, Gay Filler, MD        Review of Systems:  As per HPI- otherwise negative.   Physical Examination: Vitals:   06/20/22 1540  BP: 120/74  Pulse: 86  Resp: 18  Temp: 97.6 F (36.4 C)  SpO2: 97%   Vitals:   06/20/22 1540  Weight: 156 lb 3.2 oz (70.9 kg)  Height: 5\' 3"  (1.6 m)   Body mass index is 27.67 kg/m. Ideal Body Weight: Weight in (lb) to have BMI = 25: 140.8  GEN: no acute distress.  Minimal overweight, looks well HEENT: Atraumatic, Normocephalic. Bilateral TM wnl, oropharynx normal.  PEERL,EOMI. her frontal and maxillary sinuses are tender to percussion.  Nasal cavity is inflamed Ears and Nose: No external deformity. CV: RRR, No M/G/R. No JVD. No thrill. No extra heart sounds. PULM: CTA B, no wheezes, crackles, rhonchi. No retractions. No resp. distress. No accessory muscle use. ABD: S, NT, ND, +BS. No rebound. No HSM. EXTR: No c/c/e PSYCH: Normally interactive. Conversant.   Results for orders placed or performed in visit on 06/20/22  POC COVID-19 BinaxNow  Result Value Ref Range   SARS Coronavirus 2 Ag Negative Negative    Assessment and Plan: Encounter for screening for COVID-19 - Plan: POC COVID-19 BinaxNow  Acute non-recurrent frontal sinusitis - Plan: amoxicillin (AMOXIL) 500 MG capsule  Patient seen today with likely sinusitis.  Unfortunately I am not able to test her for the flu as we do not have any test kits.  Will treat her with amoxicillin as above.  She is asked to please let us know if not feeling better over the next few days, sooner if getting worse Signed Lamar Blinks, MD

## 2022-06-20 ENCOUNTER — Ambulatory Visit (INDEPENDENT_AMBULATORY_CARE_PROVIDER_SITE_OTHER): Payer: Medicare Other | Admitting: Family Medicine

## 2022-06-20 VITALS — BP 120/74 | HR 86 | Temp 97.6°F | Resp 18 | Ht 63.0 in | Wt 156.2 lb

## 2022-06-20 DIAGNOSIS — J0110 Acute frontal sinusitis, unspecified: Secondary | ICD-10-CM | POA: Diagnosis not present

## 2022-06-20 DIAGNOSIS — Z1152 Encounter for screening for COVID-19: Secondary | ICD-10-CM

## 2022-06-20 DIAGNOSIS — R55 Syncope and collapse: Secondary | ICD-10-CM | POA: Diagnosis not present

## 2022-06-20 LAB — POC COVID19 BINAXNOW: SARS Coronavirus 2 Ag: NEGATIVE

## 2022-06-20 MED ORDER — AMOXICILLIN 500 MG PO CAPS: 1000.0000 mg | ORAL_CAPSULE | Freq: Two times a day (BID) | ORAL | 0 refills | Status: DC

## 2022-06-20 NOTE — Patient Instructions (Signed)
We will treat you for acute sinusitis with amoxicillin Please let me know if you are not improving in the next few days- Sooner if worse.

## 2022-07-02 ENCOUNTER — Telehealth: Payer: Self-pay | Admitting: Family Medicine

## 2022-07-02 DIAGNOSIS — J0110 Acute frontal sinusitis, unspecified: Secondary | ICD-10-CM

## 2022-07-02 NOTE — Telephone Encounter (Signed)
Prescription Request  07/02/2022  Is this a "Controlled Substance" medicine? No  LOV: 06/20/2022  What is the name of the medication or equipment?   amoxicillin (AMOXIL) 500 MG capsule [[id number]]   Have you contacted your pharmacy to request a refill? No   Which pharmacy would you like this sent to?  [id number] - [place], [place] - [address] AT [place] [address] Phone: [phone] Fax: [phone]    Patient notified that their request is being sent to the clinical staff for review and that they should receive a response within 2 business days.   Please advise at Mobile [phone] (mobile)

## 2022-07-03 MED ORDER — AMOXICILLIN 500 MG PO CAPS: 1000.0000 mg | ORAL_CAPSULE | Freq: Two times a day (BID) | ORAL | 0 refills | Status: DC

## 2022-07-03 NOTE — Telephone Encounter (Signed)
Called her back- she is getting better but feels like she needs 3 days more of abx I did call in amox for 3 days more- she will let me know if not feeing well after that

## 2022-07-03 NOTE — Telephone Encounter (Signed)
Spoke with patient and she stated that she still have a lot of mucus that makes her gag, still coughing, no energy.  She has only been taking tylenol.  She would like to see if you can call in 3 days worth of amoxicillin.

## 2022-07-04 NOTE — Telephone Encounter (Signed)
Patient would like to let Dr. Lorelei Pont know that she is feeling much better.

## 2022-07-25 NOTE — Telephone Encounter (Signed)
Prolia VOB initiated via [email or web address] 

## 2022-07-26 NOTE — Telephone Encounter (Signed)
Pt ready for scheduling on or after 07/26/22  Out-of-pocket cost due at time of visit: $0  Primary: Medicare Prolia co-insurance: 0%  Admin fee co-insurance: 0%   Deductible: $0 met of $226 required (covered by secondary)  Secondary: [employer] Prolia co-insurance:  Admin fee co-insurance:   Deductible:   Prior Auth:  PA# Valid:   ** This summary of benefits is an estimation of the patient's out-of-pocket cost. Exact cost may vary based on individual plan coverage.

## 2022-07-30 NOTE — Telephone Encounter (Signed)
Pt will get at next visit with [name] 3/18.

## 2022-08-20 ENCOUNTER — Encounter (HOSPITAL_COMMUNITY): Payer: Self-pay | Admitting: Psychiatry

## 2022-08-20 ENCOUNTER — Telehealth (HOSPITAL_BASED_OUTPATIENT_CLINIC_OR_DEPARTMENT_OTHER): Payer: Medicare Other | Admitting: Psychiatry

## 2022-08-20 DIAGNOSIS — F4310 Post-traumatic stress disorder, unspecified: Secondary | ICD-10-CM

## 2022-08-20 DIAGNOSIS — F331 Major depressive disorder, recurrent, moderate: Secondary | ICD-10-CM

## 2022-08-20 MED ORDER — VENLAFAXINE HCL ER 150 MG PO CP24: 150.0000 mg | ORAL_CAPSULE | Freq: Every day | ORAL | 0 refills | Status: DC

## 2022-08-20 MED ORDER — LAMOTRIGINE 100 MG PO TABS: 100.0000 mg | ORAL_TABLET | Freq: Every day | ORAL | 0 refills | Status: DC
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
## 2022-08-24 NOTE — Patient Instructions (Addendum)
It was good to see you again today!  I will be in touch with your labs  Please stop by lab and then the imaging dept on the ground floor- we are going to try and figure out this swelling for you

## 2022-08-24 NOTE — Progress Notes (Signed)
[place] at [facility] [address], [place], [place] [address] [phone]  Date:  09/02/2022   Name:  Alexis [name]   DOB:  1945-09-11   MRN:  [id number]  PCP:  Darreld Mclean, MD    Chief Complaint: Annual Exam (Concerns/ questions: 1. swelling is not that much better today. 2. Prolia due today. 3. Dry cough started 4-5 days ago, increased fatigue. [place] [facility] exam- 06/03/22 dr Babs Bertin MA due)   History of Present Illness:  Alexis [name] is a 77 y.o. very pleasant female patient who presents with the following:  Patient seen today for routine follow-up Seen by myself was in January when she was sick.  She tested negative for COVID, treated for sinusitis at that time I then also saw her last week when she had swelling of just her left foot (both feet had been swollen for a few days but her right foot normalized)- [place] negative, BMP 103 The left foot swelling is still happening-does not really get better over night, elevation may help  Her leg itches but does not hurt  History of diet-controlled diabetes, gastric bypass, carotid artery stenosis, osteoporosis, benign tremor, depression, foot drop, lichen simplex chronicus  Baby aspirin B12 OTC Gabapentin at bedtime Lamictal 100 mg daily Venlafaxine 150 Primidone 3 times daily-I have been prescribing this instead of psychiatry or neurology recently, uses for tremors Her psychiatrist is Dr. Dossie Der She is followed by nephrology in [facility] for hyponatremia Lab Results  Component Value Date   HGBA1C 6.4 08/26/2022   Eye exam- last done about 2 months ago, she sees Dr Amalia Hailey on [address] in [place] Due for urine micro- will update today  Recommend COVID booster Mammogram can be updated- this is scheduled already  Can update routine blood work today DEXA scan last year, severe osteoporosis-she has been receiving Prolia for about 2 years but was getting worse.  Refered to  endocrinology to discuss other options- she is being seen in July We will do prolia today however  She notes her brother also has severe ostoeporosis   Wt Readings from Last 3 Encounters:  09/02/22 162 lb 3.2 oz (73.6 kg)  08/26/22 160 lb (72.6 kg)  06/20/22 156 lb 3.2 oz (70.9 kg)    Patient Active Problem List   Diagnosis Date Noted   Aspirin long-term use 07/19/2021   Depressive disorder 10/18/2020   Gastroesophageal reflux disease 10/18/2020   Occlusion and stenosis of bilateral carotid arteries 10/18/2020   Type II or unspecified type diabetes mellitus without mention of complication, not stated as uncontrolled 10/18/2020   Vitamin D deficiency 10/18/2020   Osteoporosis 12/09/2017   Syncope 07/17/2017   Shortness of breath 07/17/2017   Hyponatremia 123XX123   Lichen simplex chronicus 03/05/2017   Type 2 diabetes mellitus without complication, without long-term current use of insulin ([place]) 03/05/2017   Perianal lesion 01/31/2017   Bilateral carotid artery stenosis 12/12/2016   Restless legs 12/11/2016   History of gastric bypass 12/11/2016   PTSD (post-traumatic stress disorder) 12/11/2016   Bariatric surgery status 12/11/2016   Risk for falls 12/10/2016   Other dysphagia 08/19/2016   Right foot drop 08/19/2016   Chronic vulvitis 12/25/2015   Microcalcification of left breast on mammogram 05/11/2015   Tension type headache 06/14/2013   Essential tremor 04/13/2013   Hypersomnia 04/13/2013    Past Medical History:  Diagnosis Date   Allergy    Anemia    Arthritis  Asthma    Back pain    Barrett's esophagus    Bladder infection    Cataract    Depression    Diabetes mellitus, type II ([place])    Gallstones    Gastritis    GERD (gastroesophageal reflux disease)    Hyperlipidemia    Memory deficits    Osteoporosis 12/09/2017   PTSD (post-traumatic stress disorder)    Sleep apnea    Thyroid disease    Patient denies    Past Surgical History:  Procedure  Laterality Date   CHOLECYSTECTOMY  1999   COLONOSCOPY     ESOPHAGOGASTRODUODENOSCOPY     GANGLION CYST EXCISION Left    GASTRIC BYPASS  2000   TONSILLECTOMY AND ADENOIDECTOMY     age 46    Social History   Tobacco Use   Smoking status: Never   Smokeless tobacco: Never  Vaping Use   Vaping Use: Never used  Substance Use Topics   Alcohol use: No    Alcohol/week: 0.0 standard drinks of alcohol   Drug use: No    Family History  Problem Relation Age of Onset   Depression Mother    Heart disease Mother    Kidney disease Mother    Alcohol abuse Father    Tremor Father    Prostate cancer Father    Other Father        Hypoglycemia   Heart disease Father    Asthma Father    Diabetes Paternal Grandmother    Other Daughter        stillborn   Colon cancer Neg Hx    Esophageal cancer Neg Hx    Rectal cancer Neg Hx    Stomach cancer Neg Hx    Allergic rhinitis Neg Hx    Eczema Neg Hx    Urticaria Neg Hx    Immunodeficiency Neg Hx    Angioedema Neg Hx     Allergies  Allergen Reactions   Metformin Diarrhea    Medication list has been reviewed and updated.  Current Outpatient Medications on File Prior to Visit  Medication Sig Dispense Refill   acetaminophen (TYLENOL) 500 MG tablet Take by mouth.     aspirin 81 MG EC tablet Take 81 mg by mouth at bedtime. Swallow whole.     b complex vitamins tablet Take 1 tablet by mouth 2 (two) times daily.     BIOTIN 5000 PO Take 1 tablet by mouth 2 (two) times daily.     clindamycin (CLINDAGEL) 1 % gel Apply topically 2 (two) times daily. 30 g 0   Cyanocobalamin (VITAMIN B12 TR PO) Take 500 mcg by mouth at bedtime.      cyanocobalamin 1000 MCG tablet Take 1,000 mcg by mouth daily.     diclofenac Sodium (VOLTAREN) 1 % GEL Apply topically.     Ferrous Sulfate (IRON PO) Take 1 tablet by mouth 3 (three) times a week.     fluticasone (FLONASE) 50 MCG/ACT nasal spray SPRAY 2 SPRAYS INTO EACH NOSTRIL EVERY DAY 48 mL 1   lamoTRIgine  (LAMICTAL) 100 MG tablet Take 1 tablet (100 mg total) by mouth daily. 90 tablet 0   loperamide (IMODIUM A-D) 2 MG tablet Take 2 mg by mouth as needed.      omeprazole (PRILOSEC) 40 MG capsule TAKE 1 CAPSULE BY MOUTH TWICE A DAY 180 capsule 1   primidone (MYSOLINE) 50 MG tablet TAKE 2 TABLETS EVERY MORNING TAKE 1 TABLET AT NOON AND TAKE 1 TABLET AT  SUPPER 360 tablet 1   sucralfate (CARAFATE) 1 g tablet Take 1 tablet twice a day. Do not take within 2 hours of any other medication. 60 tablet 1   triamcinolone (KENALOG) 0.025 % cream Apply to lip twice daily for 10 days. 30 g 0   venlafaxine XR (EFFEXOR-XR) 150 MG 24 hr capsule Take 1 capsule (150 mg total) by mouth daily. 90 capsule 0   Current Facility-Administered Medications on File Prior to Visit  Medication Dose Route Frequency Provider Last Rate Last Admin   denosumab (PROLIA) injection 60 mg  60 mg Subcutaneous Once Telma Pyeatt, Gay Filler, MD        Review of Systems:  As per HPI- otherwise negative.   Physical Examination: Vitals:   09/02/22 1039  BP: 124/80  Pulse: 71  Resp: 18  Temp: 97.7 F (36.5 C)  SpO2: 97%   Vitals:   09/02/22 1039  Weight: 162 lb 3.2 oz (73.6 kg)  Height: 5\' 3"  (1.6 m)   Body mass index is 28.73 kg/m. Ideal Body Weight: Weight in (lb) to have BMI = 25: 140.8  GEN: no acute distress.  Normal weight, looks well HEENT: Atraumatic, Normocephalic.  Ears and Nose: No external deformity. CV: RRR, No M/G/R. No JVD. No thrill. No extra heart sounds. PULM: CTA B, no wheezes, crackles, rhonchi. No retractions. No resp. distress. No accessory muscle use. ABD: S, NT, ND, +BS. No rebound. No HSM. EXTR: No c/c-there is soft pitting 1+ edema in the left foot and ankle only. Edema does not seem to extend into her thighs.  Examined thighs, abdomen-no tenderness, no masses. Normal pulses in both feet PSYCH: Normally interactive. Conversant.  Patient notes her tremor seems to be extending from her hands into her  forearms and elbows  Assessment and Plan: History of gastric bypass - Plan: CBC, Comprehensive metabolic panel, VITAMIN D 25 Hydroxy (Vit-D Deficiency, Fractures), B12, Ferritin  Type 2 diabetes mellitus without complication, without long-term current use of insulin (HCC) - Plan: Microalbumin / creatinine urine ratio, Hemoglobin A1c  Essential tremor - Plan: TSH  Age-related osteoporosis without current pathological fracture - Plan: VITAMIN D 25 Hydroxy (Vit-D Deficiency, Fractures), denosumab (PROLIA) injection 60 mg  Screening, lipid - Plan: Lipid panel  Lower extremity edema - Plan: B Nat Peptide, DG Chest 2 View, D-Dimer, Quantitative  Medication monitoring encounter - Plan: CBC  Iron deficiency - Plan: Ferritin  Tremor, hereditary, benign  SOB (shortness of breath) - Plan: D-Dimer, Quantitative  Patient seen today for follow-up Encouraged healthy diet and exercise routine Unfortunately she is still struggling with left lower extremity edema.  Her workup last week including ultrasound and BNP was noncontributory She is interested in trying a diuretic, but we do have to consider history of hyponatremia.  Will obtain blood work as above.  Will get a D-dimer as I am still suspicious of an occult DVT  Would like to see neurology in [place] for her tremor placed referral Signed Lamar Blinks, MD  Received her chest film as below, called pt to give her this report DG Chest 2 View  Result Date: 09/02/2022 CLINICAL DATA:  Cough and lower extremity edema. EXAM: CHEST - 2 VIEW COMPARISON:  AP chest 09/05/2014 FINDINGS: Cardiac silhouette and mediastinal contours are within normal limits. Mild calcification within aortic arch. Mild right costophrenic angle curvilinear likely scarring appears unchanged from prior. No focal airspace opacity. No pulmonary edema, pleural effusion, or pneumothorax. Mild-to-moderate multilevel degenerative disc changes of the thoracic spine. There  are midline  anterior upper abdominal surgical clips/staples, similar to prior. Likely right upper quadrant cholecystectomy clips. IMPRESSION: No acute cardiopulmonary disease. Electronically Signed   By: Yvonne Kendall M.D.   On: 09/02/2022 12:28    Received labs as below, message to patient Results for orders placed or performed in visit on 09/02/22  Microalbumin / creatinine urine ratio  Result Value Ref Range   Microalb, Ur 0.7 0.0 - 1.9 mg/dL   Creatinine,U 70.7 mg/dL   Microalb Creat Ratio 1.0 0.0 - 30.0 mg/g  CBC  Result Value Ref Range   WBC 5.6 4.0 - 10.5 K/uL   RBC 4.07 3.87 - 5.11 Mil/uL   Platelets 217.0 150.0 - 400.0 K/uL   Hemoglobin 13.0 12.0 - 15.0 g/dL   HCT 38.5 36.0 - 46.0 %   MCV 94.5 78.0 - 100.0 fl   MCHC 33.8 30.0 - 36.0 g/dL   RDW 13.0 11.5 - 15.5 %  Comprehensive metabolic panel  Result Value Ref Range   Sodium 137 135 - 145 mEq/L   Potassium 4.5 3.5 - 5.1 mEq/L   Chloride 101 96 - 112 mEq/L   CO2 28 19 - 32 mEq/L   Glucose, Bld 96 70 - 99 mg/dL   BUN 19 6 - 23 mg/dL   Creatinine, Ser 0.72 0.40 - 1.20 mg/dL   Total Bilirubin 0.4 0.2 - 1.2 mg/dL   Alkaline Phosphatase 63 39 - 117 U/L   AST 19 0 - 37 U/L   ALT 19 0 - 35 U/L   Total Protein 6.5 6.0 - 8.3 g/dL   Albumin 4.2 3.5 - 5.2 g/dL   GFR 80.85 >60.00 mL/min   Calcium 9.0 8.4 - 10.5 mg/dL  Hemoglobin A1c  Result Value Ref Range   Hgb A1c MFr Bld 6.5 4.6 - 6.5 %  Lipid panel  Result Value Ref Range   Cholesterol 193 0 - 200 mg/dL   Triglycerides 115.0 0.0 - 149.0 mg/dL   HDL 91.90 >39.00 mg/dL   VLDL 23.0 0.0 - 40.0 mg/dL   LDL Cholesterol 78 0 - 99 mg/dL   Total CHOL/HDL Ratio 2    NonHDL 101.44   TSH  Result Value Ref Range   TSH 1.09 0.35 - 5.50 uIU/mL  VITAMIN D 25 Hydroxy (Vit-D Deficiency, Fractures)  Result Value Ref Range   VITD 39.34 30.00 - 100.00 ng/mL  B12  Result Value Ref Range   Vitamin B-12 935 (H) 211 - 911 pg/mL  Ferritin  Result Value Ref Range   Ferritin 16.1 10.0 - 291.0  ng/mL  B Nat Peptide  Result Value Ref Range   Pro B Natriuretic peptide (BNP) 101.0 (H) 0.0 - 100.0 pg/mL  D-Dimer, Quantitative  Result Value Ref Range   D-Dimer, Quant 0.28 <0.50 mcg/mL FEU

## 2022-08-26 ENCOUNTER — Ambulatory Visit (HOSPITAL_BASED_OUTPATIENT_CLINIC_OR_DEPARTMENT_OTHER)
Admission: RE | Admit: 2022-08-26 | Discharge: 2022-08-26 | Disposition: A | Discharge status: Home / Self Care | Payer: Medicare Other | Source: Ambulatory Visit | Attending: Family Medicine | Admitting: Family Medicine

## 2022-08-26 ENCOUNTER — Ambulatory Visit (INDEPENDENT_AMBULATORY_CARE_PROVIDER_SITE_OTHER): Payer: Medicare Other | Admitting: Family Medicine

## 2022-08-26 VITALS — BP 120/62 | HR 71 | Temp 97.6°F | Resp 18 | Wt 160.0 lb

## 2022-08-26 DIAGNOSIS — R2242 Localized swelling, mass and lump, left lower limb: Secondary | ICD-10-CM | POA: Insufficient documentation

## 2022-08-26 DIAGNOSIS — R0601 Orthopnea: Secondary | ICD-10-CM | POA: Diagnosis not present

## 2022-08-26 DIAGNOSIS — M7989 Other specified soft tissue disorders: Secondary | ICD-10-CM

## 2022-08-26 DIAGNOSIS — E119 Type 2 diabetes mellitus without complications: Secondary | ICD-10-CM

## 2022-08-26 LAB — CBC
HCT: 41 % (ref 36.0–46.0)
Hemoglobin: 13.8 g/dL (ref 12.0–15.0)
MCHC: 33.7 g/dL (ref 30.0–36.0)
MCV: 94.9 fl (ref 78.0–100.0)
Platelets: 246 10*3/uL (ref 150.0–400.0)
RBC: 4.32 Mil/uL (ref 3.87–5.11)
RDW: 12.9 % (ref 11.5–15.5)
WBC: 6.7 10*3/uL (ref 4.0–10.5)

## 2022-08-26 LAB — COMPREHENSIVE METABOLIC PANEL
ALT: 22 U/L (ref 0–35)
AST: 21 U/L (ref 0–37)
Albumin: 4.1 g/dL (ref 3.5–5.2)
Alkaline Phosphatase: 65 U/L (ref 39–117)
BUN: 15 mg/dL (ref 6–23)
CO2: 28 mEq/L (ref 19–32)
Calcium: 9.6 mg/dL (ref 8.4–10.5)
Chloride: 99 mEq/L (ref 96–112)
Creatinine, Ser: 0.79 mg/dL (ref 0.40–1.20)
GFR: 72.34 mL/min (ref >60.00)
Glucose, Bld: 131 mg/dL — ABNORMAL HIGH (ref 70–99)
Potassium: 4.5 mEq/L (ref 3.5–5.1)
Sodium: 136 mEq/L (ref 135–145)
Total Bilirubin: 0.4 mg/dL (ref 0.2–1.2)
Total Protein: 6.5 g/dL (ref 6.0–8.3)

## 2022-08-26 LAB — BRAIN NATRIURETIC PEPTIDE: Pro B Natriuretic peptide (BNP): 103 pg/mL — ABNORMAL HIGH (ref 0.0–100.0)

## 2022-08-26 LAB — HEMOGLOBIN A1C: Hgb A1c MFr Bld: 6.4 % (ref 4.6–6.5)

## 2022-08-26 NOTE — Progress Notes (Signed)
[place] at [facility] [place], [place], [place] [address] [phone]  Date:  08/26/2022   Name:  Alexis [name]   DOB:  1945/09/01   MRN:  [id number]  PCP:  Darreld Mclean, MD    Chief Complaint: Edema (X Friday, no abnormal pain. )   History of Present Illness:  Alexis [name] is a 77 y.o. very pleasant female patient who presents with the following:  Patient seen today with concern of foot and leg swelling Most recent visit with myself was in January  History of diet-controlled diabetes, gastric bypass, carotid artery stenosis, osteoporosis, benign tremor, depression, foot drop, lichen simplex chronicus   Today is Monday- on Friday she noted she feet/ legs were swollen- about the same bilaterally  She notes her left foot is still swollen although the right is now back to normal Never had this in the past  No change in her medication No change in her diet- she mostly cooks at home, no changes in her salt intake  Patient does note that she likes to sleep propped up so she can feel more comfortable.  However this is not new Her weight is up slightly from previous  Wt Readings from Last 3 Encounters:  08/26/22 160 lb (72.6 kg)  06/20/22 156 lb 3.2 oz (70.9 kg)  03/04/22 155 lb 3.2 oz (70.4 kg)     Patient Active Problem List   Diagnosis Date Noted   Aspirin long-term use 07/19/2021   Depressive disorder 10/18/2020   Gastroesophageal reflux disease 10/18/2020   Occlusion and stenosis of bilateral carotid arteries 10/18/2020   Type II or unspecified type diabetes mellitus without mention of complication, not stated as uncontrolled 10/18/2020   Vitamin D deficiency 10/18/2020   Osteoporosis 12/09/2017   Syncope 07/17/2017   Shortness of breath 07/17/2017   Hyponatremia 123XX123   Lichen simplex chronicus 03/05/2017   Type 2 diabetes mellitus without complication, without long-term current use of insulin  ([place]) 03/05/2017   Perianal lesion 01/31/2017   Bilateral carotid artery stenosis 12/12/2016   Restless legs 12/11/2016   History of gastric bypass 12/11/2016   PTSD (post-traumatic stress disorder) 12/11/2016   Bariatric surgery status 12/11/2016   Risk for falls 12/10/2016   Other dysphagia 08/19/2016   Right foot drop 08/19/2016   Chronic vulvitis 12/25/2015   Microcalcification of left breast on mammogram 05/11/2015   Tension type headache 06/14/2013   Essential tremor 04/13/2013   Hypersomnia 04/13/2013    Past Medical History:  Diagnosis Date   Allergy    Anemia    Arthritis    Asthma    Back pain    Barrett's esophagus    Bladder infection    Cataract    Depression    Diabetes mellitus, type II ([place])    Gallstones    Gastritis    GERD (gastroesophageal reflux disease)    Hyperlipidemia    Memory deficits    Osteoporosis 12/09/2017   PTSD (post-traumatic stress disorder)    Sleep apnea    Thyroid disease    Patient denies    Past Surgical History:  Procedure Laterality Date   CHOLECYSTECTOMY  1999   COLONOSCOPY     ESOPHAGOGASTRODUODENOSCOPY     GANGLION CYST EXCISION Left    GASTRIC BYPASS  2000   TONSILLECTOMY AND ADENOIDECTOMY     age 69    Social History   Tobacco Use   Smoking status: Never  Smokeless tobacco: Never  Vaping Use   Vaping Use: Never used  Substance Use Topics   Alcohol use: No    Alcohol/week: 0.0 standard drinks of alcohol   Drug use: No    Family History  Problem Relation Age of Onset   Depression Mother    Heart disease Mother    Kidney disease Mother    Alcohol abuse Father    Tremor Father    Prostate cancer Father    Other Father        Hypoglycemia   Heart disease Father    Asthma Father    Diabetes Paternal Grandmother    Other Daughter        stillborn   Colon cancer Neg Hx    Esophageal cancer Neg Hx    Rectal cancer Neg Hx    Stomach cancer Neg Hx    Allergic rhinitis Neg Hx    Eczema Neg Hx     Urticaria Neg Hx    Immunodeficiency Neg Hx    Angioedema Neg Hx     Allergies  Allergen Reactions   Metformin Diarrhea    Medication list has been reviewed and updated.  Current Outpatient Medications on File Prior to Visit  Medication Sig Dispense Refill   acetaminophen (TYLENOL) 500 MG tablet Take by mouth.     aspirin 81 MG EC tablet Take 81 mg by mouth at bedtime. Swallow whole.     b complex vitamins tablet Take 1 tablet by mouth 2 (two) times daily.     BIOTIN 5000 PO Take 1 tablet by mouth 2 (two) times daily.     clindamycin (CLINDAGEL) 1 % gel Apply topically 2 (two) times daily. 30 g 0   Cyanocobalamin (VITAMIN B12 TR PO) Take 500 mcg by mouth at bedtime.      cyanocobalamin 1000 MCG tablet Take 1,000 mcg by mouth daily.     diclofenac Sodium (VOLTAREN) 1 % GEL Apply topically.     Ferrous Sulfate (IRON PO) Take 1 tablet by mouth 3 (three) times a week.     fluticasone (FLONASE) 50 MCG/ACT nasal spray SPRAY 2 SPRAYS INTO EACH NOSTRIL EVERY DAY 48 mL 1   lamoTRIgine (LAMICTAL) 100 MG tablet Take 1 tablet (100 mg total) by mouth daily. 90 tablet 0   loperamide (IMODIUM A-D) 2 MG tablet Take 2 mg by mouth as needed.      omeprazole (PRILOSEC) 40 MG capsule TAKE 1 CAPSULE BY MOUTH TWICE A DAY 180 capsule 1   primidone (MYSOLINE) 50 MG tablet TAKE 2 TABLETS EVERY MORNING TAKE 1 TABLET AT NOON AND TAKE 1 TABLET AT SUPPER 360 tablet 1   sucralfate (CARAFATE) 1 g tablet Take 1 tablet twice a day. Do not take within 2 hours of any other medication. 60 tablet 1   triamcinolone (KENALOG) 0.025 % cream Apply to lip twice daily for 10 days. 30 g 0   venlafaxine XR (EFFEXOR-XR) 150 MG 24 hr capsule Take 1 capsule (150 mg total) by mouth daily. 90 capsule 0   Current Facility-Administered Medications on File Prior to Visit  Medication Dose Route Frequency Provider Last Rate Last Admin   denosumab (PROLIA) injection 60 mg  60 mg Subcutaneous Once Leighanne Adolph, Gay Filler, MD        Review  of Systems:  As per HPI- otherwise negative.   Physical Examination: Vitals:   08/26/22 1058  BP: 120/62  Pulse: 71  Resp: 18  Temp: 97.6 F (36.4 C)  SpO2: 97%   Vitals:   08/26/22 1058  Weight: 160 lb (72.6 kg)   Body mass index is 28.34 kg/m. Ideal Body Weight:    GEN: no acute distress.  Mildly overweight, looks her normal self HEENT: Atraumatic, Normocephalic.  Ears and Nose: No external deformity. CV: RRR, No M/G/R. No JVD. No thrill. No extra heart sounds. PULM: CTA B, no wheezes, crackles, rhonchi. No retractions. No resp. distress. No accessory muscle use. ABD: S, NT, ND EXTR: No c/c/e PSYCH: Normally interactive. Conversant.  Trace edema of left foot/ ankle Mild tenderness of left calf Good pulses in bilateral feet     Assessment and Plan: Localized swelling of left lower extremity - Plan: US Venous Img Lower Unilateral Left  Foot swelling - Plan: Comprehensive metabolic panel, B Nat Peptide, CBC  Orthopnea - Plan: B Nat Peptide, CBC  Type 2 diabetes mellitus without complication, without long-term current use of insulin (HCC) - Plan: Hemoglobin A1c  Patient seen today with concern of lower extremity swelling.  Swelling has been bilateral, is now more in the left.  We will obtain a left lower extremity Doppler to rule out DVT. I will also check her kidney function and a BNP to rule out other causes of swelling Will be in touch with her pending results Signed Lamar Blinks, MD  US Venous Img Lower Unilateral Left  Result Date: 08/26/2022 CLINICAL DATA:  Sudden onset of left leg and foot swelling. Evaluate for DVT. EXAM: LEFT LOWER EXTREMITY VENOUS DOPPLER ULTRASOUND TECHNIQUE: Gray-scale sonography with graded compression, as well as color Doppler and duplex ultrasound were performed to evaluate the lower extremity deep venous systems from the level of the common femoral vein and including the common femoral, femoral, profunda femoral, popliteal and  calf veins including the posterior tibial, peroneal and gastrocnemius veins when visible. The superficial great saphenous vein was also interrogated. Spectral Doppler was utilized to evaluate flow at rest and with distal augmentation maneuvers in the common femoral, femoral and popliteal veins. COMPARISON:  None Available. FINDINGS: Contralateral Common Femoral Vein: Respiratory phasicity is normal and symmetric with the symptomatic side. No evidence of thrombus. Normal compressibility. Common Femoral Vein: No evidence of thrombus. Normal compressibility, respiratory phasicity and response to augmentation. Saphenofemoral Junction: No evidence of thrombus. Normal compressibility and flow on color Doppler imaging. Profunda Femoral Vein: No evidence of thrombus. Normal compressibility and flow on color Doppler imaging. Femoral Vein: No evidence of thrombus. Normal compressibility, respiratory phasicity and response to augmentation. Popliteal Vein: No evidence of thrombus. Normal compressibility, respiratory phasicity and response to augmentation. Calf Veins: No evidence of thrombus. Normal compressibility and flow on color Doppler imaging. Superficial Great Saphenous Vein: No evidence of thrombus. Normal compressibility. Other Findings:  None. IMPRESSION: No evidence of DVT within the left lower extremity. Electronically Signed   By: Sandi Mariscal M.D.   On: 08/26/2022 12:02   Ultrasound negative, message sent to patient

## 2022-08-26 NOTE — Patient Instructions (Signed)
Please stop by lab and then imaging to set up your lower leg ultrasound and make sure there is no clot

## 2022-08-27 ENCOUNTER — Telehealth: Payer: Self-pay | Admitting: Family Medicine

## 2022-08-27 NOTE — Telephone Encounter (Signed)
Fyi.

## 2022-08-27 NOTE — Telephone Encounter (Signed)
Patient would like to make Dr. Lorelei Pont aware that she is much better and her feet look good. She stated she will see her on her next appointment.

## 2022-08-28 ENCOUNTER — Telehealth: Payer: Self-pay | Admitting: Family Medicine

## 2022-08-28 NOTE — Telephone Encounter (Signed)
See below

## 2022-08-28 NOTE — Telephone Encounter (Signed)
Patient called to advise her foot is swelling again. She said she put on her shoe today and noticed it. Pt wants to know what Dr. Lorelei Pont would like for her do. She has appt on Monday.

## 2022-08-28 NOTE — Telephone Encounter (Signed)
Called and left message on machine, no answer at this time.  I will try her back later

## 2022-08-29 NOTE — Telephone Encounter (Signed)
Called her back- swelling is minor She would like to just see me on Monday as planned and check on it then

## 2022-09-02 ENCOUNTER — Telehealth: Payer: Self-pay

## 2022-09-02 ENCOUNTER — Telehealth: Payer: Self-pay | Admitting: Family Medicine

## 2022-09-02 ENCOUNTER — Ambulatory Visit (INDEPENDENT_AMBULATORY_CARE_PROVIDER_SITE_OTHER): Payer: Medicare Other | Admitting: Family Medicine

## 2022-09-02 ENCOUNTER — Encounter: Payer: Self-pay | Admitting: Family Medicine

## 2022-09-02 ENCOUNTER — Ambulatory Visit (HOSPITAL_BASED_OUTPATIENT_CLINIC_OR_DEPARTMENT_OTHER)
Admission: RE | Admit: 2022-09-02 | Discharge: 2022-09-02 | Disposition: A | Discharge status: Home / Self Care | Payer: Medicare Other | Source: Ambulatory Visit | Attending: Family Medicine | Admitting: Family Medicine

## 2022-09-02 VITALS — BP 124/80 | HR 71 | Temp 97.7°F | Resp 18 | Ht 63.0 in | Wt 162.2 lb

## 2022-09-02 DIAGNOSIS — E119 Type 2 diabetes mellitus without complications: Secondary | ICD-10-CM | POA: Diagnosis not present

## 2022-09-02 DIAGNOSIS — Z5181 Encounter for therapeutic drug level monitoring: Secondary | ICD-10-CM | POA: Diagnosis not present

## 2022-09-02 DIAGNOSIS — Z9884 Bariatric surgery status: Secondary | ICD-10-CM

## 2022-09-02 DIAGNOSIS — R600 Localized edema: Secondary | ICD-10-CM | POA: Insufficient documentation

## 2022-09-02 DIAGNOSIS — Z13220 Encounter for screening for lipoid disorders: Secondary | ICD-10-CM

## 2022-09-02 DIAGNOSIS — G250 Essential tremor: Secondary | ICD-10-CM | POA: Diagnosis not present

## 2022-09-02 DIAGNOSIS — R0602 Shortness of breath: Secondary | ICD-10-CM

## 2022-09-02 DIAGNOSIS — R059 Cough, unspecified: Secondary | ICD-10-CM | POA: Diagnosis not present

## 2022-09-02 DIAGNOSIS — M810 Age-related osteoporosis without current pathological fracture: Secondary | ICD-10-CM

## 2022-09-02 DIAGNOSIS — E611 Iron deficiency: Secondary | ICD-10-CM

## 2022-09-02 LAB — CBC
HCT: 38.5 % (ref 36.0–46.0)
Hemoglobin: 13 g/dL (ref 12.0–15.0)
MCHC: 33.8 g/dL (ref 30.0–36.0)
MCV: 94.5 fl (ref 78.0–100.0)
Platelets: 217 10*3/uL (ref 150.0–400.0)
RBC: 4.07 Mil/uL (ref 3.87–5.11)
RDW: 13 % (ref 11.5–15.5)
WBC: 5.6 10*3/uL (ref 4.0–10.5)

## 2022-09-02 LAB — MICROALBUMIN / CREATININE URINE RATIO
Creatinine,U: 70.7 mg/dL
Microalb Creat Ratio: 1 mg/g (ref 0.0–30.0)
Microalb, Ur: 0.7 mg/dL (ref 0.0–1.9)

## 2022-09-02 LAB — COMPREHENSIVE METABOLIC PANEL
ALT: 19 U/L (ref 0–35)
AST: 19 U/L (ref 0–37)
Albumin: 4.2 g/dL (ref 3.5–5.2)
Alkaline Phosphatase: 63 U/L (ref 39–117)
BUN: 19 mg/dL (ref 6–23)
CO2: 28 mEq/L (ref 19–32)
Calcium: 9 mg/dL (ref 8.4–10.5)
Chloride: 101 mEq/L (ref 96–112)
Creatinine, Ser: 0.72 mg/dL (ref 0.40–1.20)
GFR: 80.85 mL/min (ref >60.00)
Glucose, Bld: 96 mg/dL (ref 70–99)
Potassium: 4.5 mEq/L (ref 3.5–5.1)
Sodium: 137 mEq/L (ref 135–145)
Total Bilirubin: 0.4 mg/dL (ref 0.2–1.2)
Total Protein: 6.5 g/dL (ref 6.0–8.3)

## 2022-09-02 LAB — TSH: TSH: 1.09 u[IU]/mL (ref 0.35–5.50)

## 2022-09-02 LAB — LIPID PANEL
Cholesterol: 193 mg/dL (ref 0–200)
HDL: 91.9 mg/dL (ref >39.00)
LDL Cholesterol: 78 mg/dL (ref 0–99)
NonHDL: 101.44
Total CHOL/HDL Ratio: 2
Triglycerides: 115 mg/dL (ref 0.0–149.0)
VLDL: 23 mg/dL (ref 0.0–40.0)

## 2022-09-02 LAB — IRON,TIBC AND FERRITIN PANEL: Ferritin: 16.1

## 2022-09-02 LAB — HEMOGLOBIN A1C: Hgb A1c MFr Bld: 6.5 % (ref 4.6–6.5)

## 2022-09-02 LAB — D-DIMER, QUANTITATIVE: D-Dimer, Quant: 0.28 mcg/mL FEU (ref <0.50)

## 2022-09-02 LAB — BRAIN NATRIURETIC PEPTIDE: Pro B Natriuretic peptide (BNP): 101 pg/mL — ABNORMAL HIGH (ref 0.0–100.0)

## 2022-09-02 LAB — VITAMIN B12: Vitamin B-12: 935 pg/mL — ABNORMAL HIGH (ref 211–911)

## 2022-09-02 LAB — FERRITIN: Ferritin: 16.1 ng/mL (ref 10.0–291.0)

## 2022-09-02 LAB — VITAMIN D 25 HYDROXY (VIT D DEFICIENCY, FRACTURES): VITD: 39.34 ng/mL (ref 30.00–100.00)

## 2022-09-02 MED ORDER — DENOSUMAB 60 MG/ML ~~LOC~~ SOSY
60.0000 mg | PREFILLED_SYRINGE | Freq: Once | SUBCUTANEOUS | Status: AC
  Administered 2022-09-02: 60 mg via SUBCUTANEOUS

## 2022-09-02 NOTE — Telephone Encounter (Signed)
While in the lab today, pt requested that a copy of her labs be placed at the front desk for her to pick up as soon as they are available. She wants to take them with her to another Doctor appt she has on Thursday.

## 2022-09-02 NOTE — Telephone Encounter (Signed)
Called her back, D-dimer negative.  I am waiting on her electrolytes prior to prescribing a diuretic

## 2022-09-02 NOTE — Telephone Encounter (Signed)
Pt said Dr. Lorelei Pont was going to send in a water pill (pt did not know the name) but when she went to the pharmacy, they did not have the medication. Please call pt to advise about prescription for water pill.

## 2022-09-02 NOTE — Telephone Encounter (Signed)
Prolia administered today Next due: 03/06/23

## 2022-09-02 NOTE — Telephone Encounter (Signed)
See below

## 2022-09-03 MED ORDER — HYDROCHLOROTHIAZIDE 25 MG PO TABS: 12.5000 mg | ORAL_TABLET | Freq: Every day | ORAL | 3 refills | Status: DC

## 2022-09-06 DIAGNOSIS — E871 Hypo-osmolality and hyponatremia: Secondary | ICD-10-CM | POA: Diagnosis not present

## 2022-09-06 DIAGNOSIS — R809 Proteinuria, unspecified: Secondary | ICD-10-CM | POA: Diagnosis not present

## 2022-09-06 DIAGNOSIS — K219 Gastro-esophageal reflux disease without esophagitis: Secondary | ICD-10-CM | POA: Diagnosis not present

## 2022-09-06 DIAGNOSIS — E119 Type 2 diabetes mellitus without complications: Secondary | ICD-10-CM | POA: Diagnosis not present

## 2022-09-06 DIAGNOSIS — R309 Painful micturition, unspecified: Secondary | ICD-10-CM | POA: Diagnosis not present

## 2022-09-06 DIAGNOSIS — E559 Vitamin D deficiency, unspecified: Secondary | ICD-10-CM | POA: Diagnosis not present

## 2022-09-10 DIAGNOSIS — Z1231 Encounter for screening mammogram for malignant neoplasm of breast: Secondary | ICD-10-CM | POA: Diagnosis not present

## 2022-09-10 LAB — HM MAMMOGRAPHY

## 2022-09-12 ENCOUNTER — Encounter: Payer: Self-pay | Admitting: Family Medicine

## 2022-09-23 ENCOUNTER — Encounter: Payer: Self-pay | Admitting: Family Medicine

## 2022-09-26 ENCOUNTER — Other Ambulatory Visit: Payer: Self-pay | Admitting: Family Medicine

## 2022-09-26 DIAGNOSIS — G250 Essential tremor: Secondary | ICD-10-CM

## 2022-10-14 DIAGNOSIS — N763 Subacute and chronic vulvitis: Secondary | ICD-10-CM | POA: Diagnosis not present

## 2022-10-14 DIAGNOSIS — L280 Lichen simplex chronicus: Secondary | ICD-10-CM | POA: Diagnosis not present

## 2022-10-19 ENCOUNTER — Other Ambulatory Visit: Payer: Self-pay | Admitting: Family Medicine

## 2022-10-21 DIAGNOSIS — R251 Tremor, unspecified: Secondary | ICD-10-CM | POA: Diagnosis not present

## 2022-10-22 ENCOUNTER — Telehealth: Payer: Self-pay | Admitting: Family Medicine

## 2022-10-22 NOTE — Telephone Encounter (Signed)
Pt called stating that she wanted to let Dr. Jeanella Craze know that she is swelling badly today and that she has taken one water pill. Pt would like to know how to proceed. Please Advise.

## 2022-10-22 NOTE — Telephone Encounter (Signed)
Pt was seen at Neuro yesterday and had gained 2-3 lbs since her OV at GYN last week. She did take 1/2 tab of HCTZ and has noticed improvements in the Edema. Denies any chest congestion/ pain, or SOB. Please advise.

## 2022-10-23 ENCOUNTER — Encounter: Payer: Self-pay | Admitting: Family Medicine

## 2022-10-23 NOTE — Telephone Encounter (Signed)
Called her back- she took another 1/2 hctz today - 12.5 mg.  She does feel like this helps, she notes her rings are fitting her properly again No orthopnea/ no SOB but she notes her weight was up a few lbs at MD office- admits she is not checking home weights. I encouraged her to do so, in order to have a consistent scale reading  We made appointment to see her on Monday, she will check weights in the meantime and let me know if any other concerns

## 2022-10-24 NOTE — Progress Notes (Deleted)
[facility] [address], [address] [place], [place] [address] [phone]  Date:  10/28/2022   Name:  Alexis [name]   DOB:  1945-11-27   MRN:  [id number]  PCP:  Pearline Cables, MD    Chief Complaint: No chief complaint on file.   History of Present Illness:  Alexis [name] is a 77 y.o. very pleasant female patient who presents with the following:  Patient seen today with concern of lower extremity swelling I saw her last in March-Notes from that visit: I then also saw her last week when she had swelling of just her left foot (both feet had been swollen for a few days but her right foot normalized)- [place] negative, BMP 103 The left foot swelling is still happening-does not really get better over night, elevation may help  Her leg itches but does not hurt History of diet-controlled diabetes, gastric bypass, carotid artery stenosis, osteoporosis, benign tremor, depression, foot drop, lichen simplex chronicus ////////////////////////////// Unfortunately she is still struggling with left lower extremity edema.  Her workup last week including ultrasound and BNP was noncontributory She is interested in trying a diuretic, but we do have to consider history of hyponatremia.  Will obtain blood work as above.  Will get a D-dimer as I am still suspicious of an occult DVT   -D-dimer was negative -BNP 101  She called [place] last week with concern of swelling again, noted she had been taking 12.5 mg of HCTZ with some success. She was not checking home weights at that time, I asked her to please start checking daily weights on her home scale and made this appointment  We will need to monitor her sodium if she is taking diuretics  Patient Active Problem List   Diagnosis Date Noted   Aspirin long-term use 07/19/2021   Depressive disorder 10/18/2020   Gastroesophageal reflux disease 10/18/2020   Occlusion and stenosis of bilateral carotid  arteries 10/18/2020   Type II or unspecified type diabetes mellitus without mention of complication, not stated as uncontrolled 10/18/2020   Vitamin D deficiency 10/18/2020   Osteoporosis 12/09/2017   Syncope 07/17/2017   Shortness of breath 07/17/2017   Hyponatremia 03/05/2017   Lichen simplex chronicus 03/05/2017   Type 2 diabetes mellitus without complication, without long-term current use of insulin (HCC) 03/05/2017   Perianal lesion 01/31/2017   Bilateral carotid artery stenosis 12/12/2016   Restless legs 12/11/2016   History of gastric bypass 12/11/2016   PTSD (post-traumatic stress disorder) 12/11/2016   Bariatric surgery status 12/11/2016   Risk for falls 12/10/2016   Other dysphagia 08/19/2016   Right foot drop 08/19/2016   Chronic vulvitis 12/25/2015   Microcalcification of left breast on mammogram 05/11/2015   Tension type headache 06/14/2013   Essential tremor 04/13/2013   Hypersomnia 04/13/2013    Past Medical History:  Diagnosis Date   Allergy    Anemia    Arthritis    Asthma    Back pain    Barrett's esophagus    Bladder infection    Cataract    Depression    Diabetes mellitus, type II (HCC)    Gallstones    Gastritis    GERD (gastroesophageal reflux disease)    Hyperlipidemia    Memory deficits    Osteoporosis 12/09/2017   PTSD (post-traumatic stress disorder)    Sleep apnea    Thyroid disease    Patient denies    Past Surgical History:  Procedure Laterality Date   CHOLECYSTECTOMY  1999   COLONOSCOPY     ESOPHAGOGASTRODUODENOSCOPY     GANGLION CYST EXCISION Left    GASTRIC BYPASS  2000   TONSILLECTOMY AND ADENOIDECTOMY     age 26    Social History   Tobacco Use   Smoking status: Never   Smokeless tobacco: Never  Vaping Use   Vaping Use: Never used  Substance Use Topics   Alcohol use: No    Alcohol/week: 0.0 standard drinks of alcohol   Drug use: No    Family History  Problem Relation Age of Onset   Depression Mother    Heart  disease Mother    Kidney disease Mother    Alcohol abuse Father    Tremor Father    Prostate cancer Father    Other Father        Hypoglycemia   Heart disease Father    Asthma Father    Diabetes Paternal Grandmother    Other Daughter        stillborn   Colon cancer Neg Hx    Esophageal cancer Neg Hx    Rectal cancer Neg Hx    Stomach cancer Neg Hx    Allergic rhinitis Neg Hx    Eczema Neg Hx    Urticaria Neg Hx    Immunodeficiency Neg Hx    Angioedema Neg Hx     Allergies  Allergen Reactions   Metformin Diarrhea    Medication list has been reviewed and updated.  Current Outpatient Medications on File Prior to Visit  Medication Sig Dispense Refill   acetaminophen (TYLENOL) 500 MG tablet Take by mouth.     aspirin 81 MG EC tablet Take 81 mg by mouth at bedtime. Swallow whole.     b complex vitamins tablet Take 1 tablet by mouth 2 (two) times daily.     BIOTIN 5000 PO Take 1 tablet by mouth 2 (two) times daily.     clindamycin (CLINDAGEL) 1 % gel Apply topically 2 (two) times daily. 30 g 0   Cyanocobalamin (VITAMIN B12 TR PO) Take 500 mcg by mouth at bedtime.      cyanocobalamin 1000 MCG tablet Take 1,000 mcg by mouth daily.     diclofenac Sodium (VOLTAREN) 1 % GEL Apply topically.     Ferrous Sulfate (IRON PO) Take 1 tablet by mouth 3 (three) times a week.     fluticasone (FLONASE) 50 MCG/ACT nasal spray SPRAY 2 SPRAYS INTO EACH NOSTRIL EVERY DAY 48 mL 1   hydrochlorothiazide (HYDRODIURIL) 25 MG tablet Take 0.5 tablets (12.5 mg total) by mouth daily. Use as needed for swelling 30 tablet 3   lamoTRIgine (LAMICTAL) 100 MG tablet Take 1 tablet (100 mg total) by mouth daily. 90 tablet 0   loperamide (IMODIUM A-D) 2 MG tablet Take 2 mg by mouth as needed.      omeprazole (PRILOSEC) 40 MG capsule TAKE 1 CAPSULE BY MOUTH TWICE A DAY 180 capsule 1   primidone (MYSOLINE) 50 MG tablet TAKE 2 TABLETS EVERY MORNING TAKE 1 TABLET AT NOON AND TAKE 1 TABLET AT SUPPER 360 tablet 1    sucralfate (CARAFATE) 1 g tablet Take 1 tablet twice a day. Do not take within 2 hours of any other medication. 60 tablet 1   triamcinolone (KENALOG) 0.025 % cream Apply to lip twice daily for 10 days. 30 g 0   venlafaxine XR (EFFEXOR-XR) 150 MG 24 hr capsule Take 1 capsule (150 mg total) by  mouth daily. 90 capsule 0   Current Facility-Administered Medications on File Prior to Visit  Medication Dose Route Frequency Provider Last Rate Last Admin   denosumab (PROLIA) injection 60 mg  60 mg Subcutaneous Once Bryttney Netzer, Gwenlyn Found, MD        Review of Systems:  As per HPI- otherwise negative.    Physical Examination: There were no vitals filed for this visit. There were no vitals filed for this visit. There is no height or weight on file to calculate BMI. Ideal Body Weight:    GEN: no acute distress. HEENT: Atraumatic, Normocephalic.  Ears and Nose: No external deformity. CV: RRR, No M/G/R. No JVD. No thrill. No extra heart sounds. PULM: CTA B, no wheezes, crackles, rhonchi. No retractions. No resp. distress. No accessory muscle use. ABD: S, NT, ND, +BS. No rebound. No HSM. EXTR: No c/c/e PSYCH: Normally interactive. Conversant.    Assessment and Plan: ***  Signed Abbe Amsterdam, MD

## 2022-10-28 ENCOUNTER — Ambulatory Visit: Payer: Medicare Other | Admitting: Family Medicine

## 2022-10-28 ENCOUNTER — Telehealth: Payer: Self-pay | Admitting: Family Medicine

## 2022-10-28 NOTE — Telephone Encounter (Signed)
Pt called to say her foot is almost normal and so she does not need the appointment  today at 11 am unless Dr. Patsy Lager wants her to still come in. Appt was cancelled but please call if she does need to come in

## 2022-10-28 NOTE — Telephone Encounter (Signed)
Please see below- pt has canceled her appointment.

## 2022-11-19 ENCOUNTER — Encounter (HOSPITAL_COMMUNITY): Payer: Self-pay | Admitting: Psychiatry

## 2022-11-19 ENCOUNTER — Telehealth (HOSPITAL_BASED_OUTPATIENT_CLINIC_OR_DEPARTMENT_OTHER): Payer: Medicare Other | Admitting: Psychiatry

## 2022-11-19 VITALS — Wt 162.0 lb

## 2022-11-19 DIAGNOSIS — F331 Major depressive disorder, recurrent, moderate: Secondary | ICD-10-CM

## 2022-11-19 DIAGNOSIS — F5101 Primary insomnia: Secondary | ICD-10-CM

## 2022-11-19 DIAGNOSIS — F4310 Post-traumatic stress disorder, unspecified: Secondary | ICD-10-CM | POA: Diagnosis not present

## 2022-11-19 MED ORDER — LAMOTRIGINE 100 MG PO TABS: 100.0000 mg | ORAL_TABLET | Freq: Every day | ORAL | 0 refills | Status: DC

## 2022-11-19 MED ORDER — VENLAFAXINE HCL ER 150 MG PO CP24: 150.0000 mg | ORAL_CAPSULE | Freq: Every day | ORAL | 0 refills | Status: DC
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
## 2022-12-06 DIAGNOSIS — R300 Dysuria: Secondary | ICD-10-CM | POA: Diagnosis not present

## 2022-12-06 DIAGNOSIS — M1A9XX0 Chronic gout, unspecified, without tophus (tophi): Secondary | ICD-10-CM | POA: Diagnosis not present

## 2022-12-06 DIAGNOSIS — E871 Hypo-osmolality and hyponatremia: Secondary | ICD-10-CM | POA: Diagnosis not present

## 2022-12-06 DIAGNOSIS — K219 Gastro-esophageal reflux disease without esophagitis: Secondary | ICD-10-CM | POA: Diagnosis not present

## 2022-12-06 DIAGNOSIS — E119 Type 2 diabetes mellitus without complications: Secondary | ICD-10-CM | POA: Diagnosis not present

## 2022-12-06 DIAGNOSIS — E559 Vitamin D deficiency, unspecified: Secondary | ICD-10-CM | POA: Diagnosis not present

## 2022-12-18 ENCOUNTER — Telehealth: Payer: Self-pay | Admitting: Family Medicine

## 2022-12-18 MED ORDER — CETIRIZINE HCL 5 MG PO TABS: 5.0000 mg | ORAL_TABLET | Freq: Every day | ORAL | 3 refills | Status: DC

## 2022-12-18 NOTE — Addendum Note (Signed)
Addended by: Abbe Amsterdam C on: 12/18/2022 07:10 PM   Modules accepted: Orders

## 2022-12-18 NOTE — Telephone Encounter (Signed)
Prescription Request  12/18/2022  Is this a "Controlled Substance" medicine? No  LOV: 09/02/2022  What is the name of the medication or equipment?   Cetirizine (Zyrtec)  Have you contacted your pharmacy to request a refill? No   Which pharmacy would you like this sent to?  [id number] - [place], [place] - [address] [place]  [address] Phone: [phone] Fax: [phone]    Patient notified that their request is being sent to the clinical staff for review and that they should receive a response within 2 business days.   Please advise at Mobile [phone] (mobile)

## 2022-12-26 DIAGNOSIS — H40003 Preglaucoma, unspecified, bilateral: Secondary | ICD-10-CM | POA: Diagnosis not present

## 2022-12-26 DIAGNOSIS — H04123 Dry eye syndrome of bilateral lacrimal glands: Secondary | ICD-10-CM | POA: Diagnosis not present

## 2023-01-09 ENCOUNTER — Encounter: Payer: Self-pay | Admitting: Internal Medicine

## 2023-01-09 ENCOUNTER — Ambulatory Visit: Payer: Medicare Other | Admitting: Internal Medicine

## 2023-01-09 VITALS — BP 104/70 | HR 66 | Ht 63.0 in | Wt 170.4 lb

## 2023-01-09 DIAGNOSIS — M810 Age-related osteoporosis without current pathological fracture: Secondary | ICD-10-CM

## 2023-01-09 DIAGNOSIS — Z8349 Family history of other endocrine, nutritional and metabolic diseases: Secondary | ICD-10-CM

## 2023-01-09 LAB — TSH: TSH: 1.21 u[IU]/mL (ref 0.35–5.50)

## 2023-01-09 LAB — COMPREHENSIVE METABOLIC PANEL
ALT: 20 U/L (ref 0–35)
AST: 21 U/L (ref 0–37)
Albumin: 4.2 g/dL (ref 3.5–5.2)
Alkaline Phosphatase: 60 U/L (ref 39–117)
BUN: 17 mg/dL (ref 6–23)
CO2: 29 mEq/L (ref 19–32)
Calcium: 9.1 mg/dL (ref 8.4–10.5)
Chloride: 97 mEq/L (ref 96–112)
Creatinine, Ser: 0.72 mg/dL (ref 0.40–1.20)
GFR: 80.65 mL/min (ref >60.00)
Glucose, Bld: 141 mg/dL — ABNORMAL HIGH (ref 70–99)
Potassium: 4.9 mEq/L (ref 3.5–5.1)
Sodium: 133 mEq/L — ABNORMAL LOW (ref 135–145)
Total Bilirubin: 0.4 mg/dL (ref 0.2–1.2)
Total Protein: 6.5 g/dL (ref 6.0–8.3)

## 2023-01-09 LAB — VITAMIN D 25 HYDROXY (VIT D DEFICIENCY, FRACTURES): VITD: 40.01 ng/mL (ref 30.00–100.00)

## 2023-01-09 LAB — PHOSPHORUS: Phosphorus: 4.9 mg/dL — ABNORMAL HIGH (ref 2.3–4.6)

## 2023-01-09 LAB — MAGNESIUM: Magnesium: 1.9 mg/dL (ref 1.5–2.5)

## 2023-01-09 NOTE — Progress Notes (Unsigned)
Name: Alexis [name]  MRN/ DOB: [id number], 1945/09/27    Age/ Sex: 77 y.o., female    PCP: [name], Gwenlyn Found, MD   Reason for Endocrinology Evaluation: Osteoporosis     Date of Initial Endocrinology Evaluation: 01/09/2023     HPI: [name] is a 77 y.o. female with a past medical history of Hx of gastric bypass, carotid artery stenosis, depression, diet-controlled diabetes mellitus, benign tremors. The patient presented for initial endocrinology clinic visit on 01/09/2023 for consultative assistance with her osteoporosis.   Pt was diagnosed with osteoporosis: 2019 with a T-score of -3.9 at the distal forearm  Menarche at age : does not recall Menopausal at age : 72 Fracture Hx: toe fracture ( feel in a parking lot) Hx of HRT: no FH of osteoporosis or hip fracture: Brother Prior Hx of anti-resorptive therapy : Prolia started 2022  Calcium-  does not take  Vitamin D - 1000 international unit  BID   Of note, the patient has a history of gastric bypass She follows with nephrology for hyponatremia Pt on chronic PPI   She has chronic GERD  Has alternating constipation or diarrhea  She has chronic pain of knee and back pain  Has leg cramps  No Tobacco  NO ETOH   Last dose of Prolia 09/02/2022   HISTORY:  Past Medical History:  Past Medical History:  Diagnosis Date   Allergy    Anemia    Arthritis    Asthma    Back pain    Barrett's esophagus    Bladder infection    Cataract    Depression    Diabetes mellitus, type II (HCC)    Gallstones    Gastritis    GERD (gastroesophageal reflux disease)    Hyperlipidemia    Memory deficits    Osteoporosis 12/09/2017   PTSD (post-traumatic stress disorder)    Sleep apnea    Thyroid disease    Patient denies   Past Surgical History:  Past Surgical History:  Procedure Laterality Date   CHOLECYSTECTOMY  1999   COLONOSCOPY     ESOPHAGOGASTRODUODENOSCOPY     GANGLION CYST EXCISION Left    GASTRIC BYPASS  2000    TONSILLECTOMY AND ADENOIDECTOMY     age 44    Social History:  reports that she has never smoked. She has never used smokeless tobacco. She reports that she does not drink alcohol and does not use drugs. Family History: family history includes Alcohol abuse in her father; Asthma in her father; Depression in her mother; Diabetes in her paternal grandmother; Heart disease in her father and mother; Kidney disease in her mother; Other in her daughter and father; Prostate cancer in her father; Tremor in her father.   HOME MEDICATIONS: Allergies as of 01/09/2023       Reactions   Metformin Diarrhea        Medication List        Accurate as of January 09, 2023 10:33 AM. If you have any questions, ask your nurse or doctor.          STOP taking these medications    triamcinolone 0.025 % cream Commonly known as: KENALOG Stopped by: Johnney Ou Zeyad Delaguila       TAKE these medications    acetaminophen 500 MG tablet Commonly known as: TYLENOL Take by mouth.   aspirin EC 81 MG tablet Take 81 mg by mouth at bedtime. Swallow whole.   b complex vitamins tablet Take 1  tablet by mouth 2 (two) times daily.   BIOTIN 5000 PO Take 1 tablet by mouth 2 (two) times daily.   cetirizine 5 MG tablet Commonly known as: ZYRTEC Take 1 tablet (5 mg total) by mouth daily. Use as needed for allergies   clindamycin 1 % gel Commonly known as: CLINDAGEL Apply topically 2 (two) times daily.   diclofenac Sodium 1 % Gel Commonly known as: VOLTAREN Apply topically.   fluticasone 50 MCG/ACT nasal spray Commonly known as: FLONASE SPRAY 2 SPRAYS INTO EACH NOSTRIL EVERY DAY   hydrochlorothiazide 25 MG tablet Commonly known as: HYDRODIURIL Take 0.5 tablets (12.5 mg total) by mouth daily. Use as needed for swelling   IRON PO Take 1 tablet by mouth 3 (three) times a week.   lamoTRIgine 100 MG tablet Commonly known as: LAMICTAL Take 1 tablet (100 mg total) by mouth daily.   loperamide 2 MG  tablet Commonly known as: IMODIUM A-D Take 2 mg by mouth as needed.   omeprazole 40 MG capsule Commonly known as: PRILOSEC TAKE 1 CAPSULE BY MOUTH TWICE A DAY   primidone 50 MG tablet Commonly known as: MYSOLINE TAKE 2 TABLETS EVERY MORNING TAKE 1 TABLET AT NOON AND TAKE 1 TABLET AT SUPPER   sucralfate 1 g tablet Commonly known as: Carafate Take 1 tablet twice a day. Do not take within 2 hours of any other medication.   venlafaxine XR 150 MG 24 hr capsule Commonly known as: EFFEXOR-XR Take 1 capsule (150 mg total) by mouth daily.   VITAMIN B12 TR PO Take 500 mcg by mouth at bedtime. What changed: Another medication with the same name was removed. Continue taking this medication, and follow the directions you see here. Changed by: Johnney Ou Jerral Mccauley          REVIEW OF SYSTEMS: A comprehensive ROS was conducted with the patient and is negative except as per HPI    OBJECTIVE:  VS: BP 104/70 (BP Location: Left Arm, Patient Position: Sitting, Cuff Size: Small)   Pulse 66   Ht 5\' 3"  (1.6 m)   Wt 170 lb 6.4 oz (77.3 kg)   SpO2 97%   BMI 30.19 kg/m    Wt Readings from Last 3 Encounters:  01/09/23 170 lb 6.4 oz (77.3 kg)  09/02/22 162 lb 3.2 oz (73.6 kg)  08/26/22 160 lb (72.6 kg)     EXAM: General: Pt appears well and is in NAD  Neck: General: Supple without adenopathy. Thyroid: Thyroid size normal.  No goiter or nodules appreciated.   Lungs: Clear with good BS bilat   Heart: Auscultation: RRR.  Abdomen: Soft, nontender  Extremities:  BL LE: No pretibial edema   Mental Status: Judgment, insight: Intact Orientation: Oriented to time, place, and person Mood and affect: No depression, anxiety, or agitation     DATA REVIEWED:  Latest Reference Range & Units 01/09/23 11:07  Sodium 135 - 145 mEq/L 133 (L)  Potassium 3.5 - 5.1 mEq/L 4.9  Chloride 96 - 112 mEq/L 97  CO2 19 - 32 mEq/L 29  Glucose 70 - 99 mg/dL 098 (H)  BUN 6 - 23 mg/dL 17  Creatinine 1.19 -  1.20 mg/dL 1.47  Calcium 8.4 - 82.9 mg/dL 9.1  Phosphorus 2.3 - 4.6 mg/dL 4.9 (H)  Magnesium 1.5 - 2.5 mg/dL 1.9  Alkaline Phosphatase 39 - 117 U/L 60  Albumin 3.5 - 5.2 g/dL 4.2  AST 0 - 37 U/L 21  ALT 0 - 35 U/L 20  Total  Protein 6.0 - 8.3 g/dL 6.5  Total Bilirubin 0.2 - 1.2 mg/dL 0.4  GFR >78.29 mL/min 80.65    Latest Reference Range & Units 01/09/23 11:07  VITD 30.00 - 100.00 ng/mL 40.01    Latest Reference Range & Units 01/09/23 11:07  PTH, Intact 16 - 77 pg/mL 66  TSH 0.35 - 5.50 uIU/mL 1.21     DXA 03/21/2022    The BMD measured at Forearm Radius 33% is 0.472 g/cm2 with a T-score of -4.6. This patient is considered osteoporotic according to World Health Organization [facility]) criteria. Lumbar spine was not utilized due to advanced degenerative changes. Compared with the prior study on, 02/28/2020 the BMD of the total mean showsa statistically significant increase. The scan quality is good.   Site Region Measured Date Measured Age WHO YA BMD Classification T-score DualFemur Total Mean 03/21/2022 76.6 Low Bone Mass -1.6 0.808 g/cm2 DualFemur Total Mean 02/28/2020 74.5 Low Bone Mass -1.9 0.773 g/cm2 DualFemur Total Mean 12/04/2017 72.3 Low Bone Mass -2.0 0.751 g/cm2   Left Forearm Radius 33% 03/21/2022 76.6 Osteoporosis -4.6 0.472 g/cm2 Left Forearm Radius 33% 02/28/2020 74.5 Osteoporosis -4.3 0.498 g/cm2 Left Forearm Radius 33% 12/04/2017 72.3 Osteoporosis -3.9 0.537 g/cm2  ASSESSMENT/PLAN/RECOMMENDATIONS:   Osteoporosis :  -Patient multiple risk factors to include history of gastric bypass, no calcium intake, and chronic use of PPI -She has been on Prolia for the past 2 years, despite BMD improvement at the femoral neck, there has been worsening BMD at the left distal radius -I would not recommend changing antiresorptive therapy at this point, but I have STRONGLY encouraged the patient to start calcium intake.  The patient will need to be on calcium citrate, rather  than carbonate due to chronic PPI use as well as gastric bypass.  Since she is unable to tolerate pills, I have recommended liquid calcium citrate as below -I have also recommended weighted cuffs to be worn around the wrist area to improve bone density -Repeat labs today*****  Medications : Calcium citrate 1000 mg twice daily Vitamin D3 1000 international unit twice daily Continue Prolia 60 mg subcu every 6 months   Follow-up in 6 months   Signed electronically by: Lyndle Herrlich, MD  [facility] Endocrinology  [facility] [address] [place] [place], [place] [address] Phone: [phone] FAX: [phone]   CC: Pearline Cables, MD [address] [place] [address] Phone: [phone] Fax: [phone]   Return to Endocrinology clinic as below: Future Appointments  Date Time Provider Department Center  02/11/2023  2:40 PM Arfeen, Phillips Grout, MD [facility] None  03/10/2023 10:40 AM [name], Gwenlyn Found, MD [id number]

## 2023-01-09 NOTE — Patient Instructions (Signed)
Please get the [employer] Calcium citrate 1000 mg twice daily   Please get weight cuffs and wear them on the wrist as much as you can to help the wrist bones

## 2023-01-14 ENCOUNTER — Telehealth: Payer: Self-pay | Admitting: Family Medicine

## 2023-01-14 ENCOUNTER — Telehealth: Payer: Self-pay | Admitting: Internal Medicine

## 2023-01-14 NOTE — Telephone Encounter (Signed)
I LVM for a returned call

## 2023-01-14 NOTE — Telephone Encounter (Signed)
Please let the patient know that her blood work shows that she has normal kidney function, vitamin D, magnesium, and parathyroid hormone as well as thyroid function.   The only abnormality is that her phosphorus is high, this could be either due to eating foods that have too much phosphorus such as dairy, eggs, nuts and too much protein such as meat.  She needs to reduce the amount of any of these items that she knows she eats too much of.   But also, low calcium intake can lead to high phosphorus, so please make sure that the patient is going to get her calcium as prescribed on the last visit    Thanks

## 2023-01-14 NOTE — Telephone Encounter (Signed)
Alexis [name] is aware of the results and recommendation and she is stating that she is taking the calcium she was prescribed

## 2023-01-14 NOTE — Telephone Encounter (Signed)
Initial Comment Caller states she is very constipated and passed out twice so far, BP 108/70, she is also very tired, three bites of bagel, drank 1/3 of a glass of Miralax Translation No Nurse Assessment Nurse: Izora Ribas, RN, Melanie Date/Time (Eastern Time): 01/14/2023 4:12:47 PM Confirm and document reason for call. If symptomatic, describe symptoms. ---Caller states very constipated, passed out x 2 about 1:00 and 1:30pm, BP 108/70 - normal, very tired, working on a bagel, and Miralax. HX: Diabetes Does the patient have any new or worsening symptoms? ---Yes Will a triage be completed? ---Yes Related visit to physician within the last 2 weeks? ---N/A Does the PT have any chronic conditions? (i.e. diabetes, asthma, this includes High risk factors for pregnancy, etc.) ---Yes Is this a behavioral health or substance abuse call? ---No Guidelines Guideline Title Affirmed Question Affirmed Notes Nurse Date/Time (Eastern Time) Fainting [1] Fainted > 15 minutes ago AND [2] still feels weak or dizzy Izora Ribas, RN, Shawna Orleans 01/14/2023 4:14:58 PM Fainting Fainted 2 times in one day Kennieth Francois 01/14/2023 4:18:57 PM Disp. Time [name] Time) Disposition Final User 01/14/2023 4:08:53 PM Send to Urgent Queue Kizzie Fantasia 01/14/2023 4:18:23 PM Go to ED Now Izora Ribas, RN, Melanie PLEASE NOTE: All timestamps contained within this report are represented as [place] Standard Time. CONFIDENTIALTY NOTICE: This fax transmission is intended only for the addressee. It contains information that is legally privileged, confidential or otherwise protected from use or disclosure. If you are not the intended recipient, you are strictly prohibited from reviewing, disclosing, copying using or disseminating any of this information or taking any action in reliance on or regarding this information. If you have received this fax in error, please notify us immediately by telephone so that we can arrange for its return to  [place]. Phone: [phone], Toll-Free: [phone], Fax: [phone] Page: 2 of 2 Call Id: [id number] Disp. Time [name] Time) Disposition Final User 01/14/2023 4:19:42 PM Go to ED Now Yes Izora Ribas, RN, Melanie Final Disposition 01/14/2023 4:19:42 PM Go to ED Now Yes Izora Ribas, RN, Mittie Bodo Disagree/Comply Disagree Caller Understands Yes PreDisposition Call Doctor Care Advice Given Per Guideline GO TO ED NOW: * You need to be seen in the Emergency Department. * Go to the ED at ___________ Hospital. * Leave now. Drive carefully. * Another adult should drive. BRING MEDICINES: CALL EMS 911 IF: * Difficulty breathing or confusion occurs * Faints again * Too dizzy or weak to stand * You become worse CARE ADVICE given per Fainting (Adult) guideline. GO TO ED NOW: * You need to be seen in the Emergency Department. * Go to the ED at ___________ Hospital. * Leave now. Drive carefully. ANOTHER ADULT SHOULD DRIVE: * It is better and safer if another adult drives instead of you. BRING MEDICINES: CALL EMS 911 IF: CARE ADVICE given per Fainting (Adult) guideline. * Difficulty breathing or confusion occurs * Faints again * Too dizzy or weak to stand * You become worse Comments User: Patria Mane, RN Date/Time [name] Time): 01/14/2023 4:23:29 PM Does not want an appt for the office either, will call back tomorrow if not feeling better. Referrals GO TO FACILITY REFUSED

## 2023-01-14 NOTE — Telephone Encounter (Signed)
Pt states she has been constipated and has been straining so hard it has caused her to pass out twice. She now feels very weak and just wants to lay down. There was another person in the room with her so transferred to triage.

## 2023-01-15 ENCOUNTER — Encounter: Payer: Self-pay | Admitting: Physician Assistant

## 2023-01-15 ENCOUNTER — Ambulatory Visit (INDEPENDENT_AMBULATORY_CARE_PROVIDER_SITE_OTHER): Payer: Medicare Other | Admitting: Physician Assistant

## 2023-01-15 ENCOUNTER — Telehealth: Payer: Self-pay | Admitting: Physician Assistant

## 2023-01-15 VITALS — BP 122/72 | HR 83 | Temp 99.4°F | Resp 20 | Ht 63.0 in | Wt 167.0 lb

## 2023-01-15 DIAGNOSIS — E871 Hypo-osmolality and hyponatremia: Secondary | ICD-10-CM

## 2023-01-15 DIAGNOSIS — E119 Type 2 diabetes mellitus without complications: Secondary | ICD-10-CM | POA: Diagnosis not present

## 2023-01-15 DIAGNOSIS — K5900 Constipation, unspecified: Secondary | ICD-10-CM | POA: Diagnosis not present

## 2023-01-15 LAB — COMPREHENSIVE METABOLIC PANEL
ALT: 16 U/L (ref 0–35)
AST: 22 U/L (ref 0–37)
Albumin: 4.3 g/dL (ref 3.5–5.2)
Alkaline Phosphatase: 60 U/L (ref 39–117)
BUN: 14 mg/dL (ref 6–23)
CO2: 26 mEq/L (ref 19–32)
Calcium: 9 mg/dL (ref 8.4–10.5)
Chloride: 89 mEq/L — ABNORMAL LOW (ref 96–112)
Creatinine, Ser: 0.67 mg/dL (ref 0.40–1.20)
GFR: 84.29 mL/min (ref >60.00)
Glucose, Bld: 221 mg/dL — ABNORMAL HIGH (ref 70–99)
Potassium: 4.5 mEq/L (ref 3.5–5.1)
Sodium: 126 mEq/L — ABNORMAL LOW (ref 135–145)
Total Bilirubin: 0.6 mg/dL (ref 0.2–1.2)
Total Protein: 6.8 g/dL (ref 6.0–8.3)

## 2023-01-15 LAB — PHOSPHORUS: Phosphorus: 3.8 mg/dL (ref 2.3–4.6)

## 2023-01-15 MED ORDER — LANCET DEVICE MISC: 1.0000 | Freq: Three times a day (TID) | 0 refills | Status: DC

## 2023-01-15 MED ORDER — LANCETS MISC. MISC: 1.0000 | Freq: Three times a day (TID) | 0 refills | Status: AC

## 2023-01-15 MED ORDER — BLOOD GLUCOSE TEST VI STRP: 1.0000 | ORAL_STRIP | Freq: Three times a day (TID) | 0 refills | Status: DC

## 2023-01-15 MED ORDER — BLOOD GLUCOSE MONITORING SUPPL DEVI: 1.0000 | Freq: Three times a day (TID) | 0 refills | Status: DC

## 2023-01-15 NOTE — Progress Notes (Signed)
Established patient visit   Patient: Alexis [name]   DOB: December 31, 1945   77 y.o. Female  MRN: [id number] Visit Date: 01/15/2023  Today's healthcare provider: Alfredia Ferguson, PA-C   Chief Complaint  Patient presents with   Bowel Issues   Constipation    Had a bowel movement this morning patient took Murelax   Subjective    HPI  Pt reports today with her daughter.  Patient reports that for the last 2 to 3 days she has been constipated and needs to strain to have bowel movements.  Yesterday she had 2 episodes on the toilet where she strained and fainted.  Reports falling straight forward did not hit her head was able to ambulate afterwards.  She started MiraLAX yesterday and had a small but still difficult bowel movement this morning.  Denies blood in stool.  She reports seeing a new endocrinologist who told her she has high phosphorus.  She was advised not to eat dairy nuts or proteins.  Given her struggles with hyperkalemia and diabetes both her and her daughter are wondering what is safe for her to eat.  Patient is also been told to limit fluids and has likely less than 24 ounces of water daily.  Denies heart palpitations, abdominal pain.  Denies dizziness aside from when she fainted during her bowel movements.  Also recalls 2 episodes within the last month or so where she fainted while out and about, i.e. [place] and [address].  She thinks it was because of low blood sugar.  She does not check her blood sugar consistently. Medications: Outpatient Medications Prior to Visit  Medication Sig   acetaminophen (TYLENOL) 500 MG tablet Take by mouth.   Alum Hydroxide-Mag Trisilicate (GAVISCON) 80-14.2 MG CHEW Chew 2 tablets by mouth 2 (two) times daily at 10 AM and 5 PM.   aspirin 81 MG EC tablet Take 81 mg by mouth at bedtime. Swallow whole.   b complex vitamins tablet Take 1 tablet by mouth 2 (two) times daily.   BIOTIN 5000 PO Take 1 tablet by mouth 2 (two) times daily.   cetirizine  (ZYRTEC) 5 MG tablet Take 1 tablet (5 mg total) by mouth daily. Use as needed for allergies   clindamycin (CLINDAGEL) 1 % gel Apply topically 2 (two) times daily.   Cyanocobalamin (VITAMIN B12 TR PO) Take 500 mcg by mouth at bedtime.    diclofenac Sodium (VOLTAREN) 1 % GEL Apply topically.   diphenhydrAMINE (BENADRYL) 25 mg capsule Take 25 mg by mouth every 6 (six) hours as needed.   Ferrous Sulfate (IRON PO) Take 1 tablet by mouth 3 (three) times a week.   fluticasone (FLONASE) 50 MCG/ACT nasal spray SPRAY 2 SPRAYS INTO EACH NOSTRIL EVERY DAY   hydrochlorothiazide (HYDRODIURIL) 25 MG tablet Take 0.5 tablets (12.5 mg total) by mouth daily. Use as needed for swelling   lamoTRIgine (LAMICTAL) 100 MG tablet Take 1 tablet (100 mg total) by mouth daily.   loperamide (IMODIUM A-D) 2 MG tablet Take 2 mg by mouth as needed.   omeprazole (PRILOSEC) 40 MG capsule TAKE 1 CAPSULE BY MOUTH TWICE A DAY   Polyethyl Glycol-Propyl Glycol (SYSTANE) 0.4-0.3 % SOLN Apply 2 drops to eye 4 (four) times daily.   primidone (MYSOLINE) 50 MG tablet TAKE 2 TABLETS EVERY MORNING TAKE 1 TABLET AT NOON AND TAKE 1 TABLET AT SUPPER   sucralfate (CARAFATE) 1 g tablet Take 1 tablet twice a day. Do not take within 2 hours of any  other medication.   triamcinolone cream (KENALOG) 0.1 % Apply 1 Application topically 2 (two) times daily.   venlafaxine XR (EFFEXOR-XR) 150 MG 24 hr capsule Take 1 capsule (150 mg total) by mouth daily.   Facility-Administered Medications Prior to Visit  Medication Dose Route Frequency Provider   denosumab (PROLIA) injection 60 mg  60 mg Subcutaneous Once [name], Gwenlyn Found, MD    Review of Systems  Constitutional:  Negative for fatigue and fever.  Respiratory:  Negative for cough and shortness of breath.   Cardiovascular:  Negative for chest pain and leg swelling.  Gastrointestinal:  Positive for constipation. Negative for abdominal pain and blood in stool.  Neurological:  Positive for syncope.  Negative for dizziness and headaches.       Objective    BP 122/72 (BP Location: Left Arm, Patient Position: Sitting, Cuff Size: Normal)   Pulse 83   Temp 99.4 F (37.4 C) (Oral)   Resp 20   Ht 5\' 3"  (1.6 m)   Wt 167 lb (75.8 kg)   SpO2 98%   BMI 29.58 kg/m   Physical Exam Constitutional:      General: She is awake.     Appearance: She is well-developed.  HENT:     Head: Normocephalic.  Eyes:     Conjunctiva/sclera: Conjunctivae normal.  Cardiovascular:     Rate and Rhythm: Normal rate and regular rhythm.     Heart sounds: Normal heart sounds. No murmur heard. Pulmonary:     Effort: Pulmonary effort is normal.     Breath sounds: Normal breath sounds.  Abdominal:     General: There is no distension.     Palpations: Abdomen is soft.     Tenderness: There is no abdominal tenderness. There is no guarding.  Skin:    General: Skin is warm.  Neurological:     Mental Status: She is alert and oriented to person, place, and time.  Psychiatric:        Attention and Perception: Attention normal.        Mood and Affect: Mood normal.        Speech: Speech normal.        Behavior: Behavior is cooperative.      No results found for any visits on 01/15/23.  Assessment & Plan     1. Type 2 diabetes mellitus without complication, without long-term current use of insulin (HCC)  Recommending patient start monitoring her blood sugar at home again.  Prescribed glucometer.  Advise she keep a snack with her in case she is out and has a hypoglycemic episode.  - Blood Glucose Monitoring Suppl DEVI; 1 each by Does not apply route in the morning, at noon, and at bedtime. May substitute to any manufacturer covered by patient's insurance.  Dispense: 1 each; Refill: 0 - Glucose Blood (BLOOD GLUCOSE TEST STRIPS) STRP; 1 each by In Vitro route in the morning, at noon, and at bedtime. May substitute to any manufacturer covered by patient's insurance.  Dispense: 100 strip; Refill: 0 - Lancet  Device MISC; 1 each by Does not apply route in the morning, at noon, and at bedtime. May substitute to any manufacturer covered by patient's insurance.  Dispense: 1 each; Refill: 0 - Lancets Misc. MISC; 1 each by Does not apply route in the morning, at noon, and at bedtime. May substitute to any manufacturer covered by patient's insurance.  Dispense: 100 each; Refill: 0  2. Hyperphosphatemia From diet review sounds like patient consumes a considerable  amount of dairy.  Recommended cutting down on dairy.  Repeat CMP/phosphorus - Comp Met (CMET) -phosphorus  3. Constipation, unspecified constipation type Recommending increasing fluids, to at least 32 ounces of water daily.  Recommending continuing MiraLAX until consistent BM.  Can consider adding fiber supplement in the future to help with overall constipation Warned against straining and vasovagal episodes. Advised pt consider a life alert device in case of falls where she is unable to get up.   Return if symptoms worsen or fail to improve.      I, Alfredia Ferguson, PA-C have reviewed all documentation for this visit. The documentation on  01/15/23   for the exam, diagnosis, procedures, and orders are all accurate and complete.    Alfredia Ferguson, PA-C  [facility] [phone] (phone) [phone] (fax)  [facility]

## 2023-01-15 NOTE — Telephone Encounter (Signed)
Per pt she was advised from the pharmacy that they did not receive an order for the Blood Glucose Monitoring Suppl DEVI.  please call pharmacy & advise pt.

## 2023-01-15 NOTE — Telephone Encounter (Signed)
Pt needs to return for labs only Monday. Does not need to fast.

## 2023-01-16 ENCOUNTER — Other Ambulatory Visit: Payer: Self-pay

## 2023-01-16 DIAGNOSIS — E119 Type 2 diabetes mellitus without complications: Secondary | ICD-10-CM

## 2023-01-16 MED ORDER — BLOOD GLUCOSE MONITORING SUPPL DEVI
1.0000 | Freq: Every day | 0 refills | Status: DC
Start: 2023-01-16 — End: 2023-06-21

## 2023-01-16 MED ORDER — BLOOD GLUCOSE TEST VI STRP
1.0000 | ORAL_STRIP | Freq: Every day | 0 refills | Status: AC
Start: 2023-01-16 — End: 2023-02-15

## 2023-01-16 MED ORDER — LANCET DEVICE MISC: 1.0000 | Freq: Every day | 0 refills | Status: AC

## 2023-01-16 NOTE — Telephone Encounter (Signed)
Pt returned call. Advised her that she needs to come in Monday for labs only. She said she will call back then to schedule.

## 2023-01-16 NOTE — Telephone Encounter (Signed)
Looks like she was seen yesterday by [place] and her pharmacy didn't receive the Rx for her glucose supplies. I will call the pharmacy today to see what the issue is.

## 2023-01-16 NOTE — Telephone Encounter (Signed)
Called the pharmacy and the issue was that part [facility] only allows billing for blood sugar testing once a day. Ive updated the scripts and sent them to the pharmacy again.

## 2023-01-16 NOTE — Telephone Encounter (Signed)
Patient advised to return to lab for bmp and is scheduled for 01/21/2023 at 9:45 am

## 2023-01-20 ENCOUNTER — Encounter: Payer: Self-pay | Admitting: Family Medicine

## 2023-01-20 ENCOUNTER — Ambulatory Visit (HOSPITAL_BASED_OUTPATIENT_CLINIC_OR_DEPARTMENT_OTHER)
Admission: RE | Admit: 2023-01-20 | Discharge: 2023-01-20 | Disposition: A | Discharge status: Home / Self Care | Payer: Medicare Other | Source: Ambulatory Visit | Attending: Family Medicine | Admitting: Family Medicine

## 2023-01-20 ENCOUNTER — Ambulatory Visit (INDEPENDENT_AMBULATORY_CARE_PROVIDER_SITE_OTHER): Payer: Medicare Other | Admitting: Family Medicine

## 2023-01-20 ENCOUNTER — Telehealth: Payer: Self-pay

## 2023-01-20 VITALS — BP 110/80 | HR 92 | Temp 99.0°F | Resp 18 | Ht 63.0 in | Wt 167.0 lb

## 2023-01-20 DIAGNOSIS — R198 Other specified symptoms and signs involving the digestive system and abdomen: Secondary | ICD-10-CM | POA: Diagnosis not present

## 2023-01-20 DIAGNOSIS — E871 Hypo-osmolality and hyponatremia: Secondary | ICD-10-CM | POA: Diagnosis not present

## 2023-01-20 DIAGNOSIS — K5900 Constipation, unspecified: Secondary | ICD-10-CM | POA: Diagnosis not present

## 2023-01-20 LAB — COMPREHENSIVE METABOLIC PANEL
ALT: 16 U/L (ref 0–35)
AST: 18 U/L (ref 0–37)
Albumin: 4.1 g/dL (ref 3.5–5.2)
Alkaline Phosphatase: 68 U/L (ref 39–117)
BUN: 9 mg/dL (ref 6–23)
CO2: 25 mEq/L (ref 19–32)
Calcium: 8.8 mg/dL (ref 8.4–10.5)
Chloride: 102 mEq/L (ref 96–112)
Creatinine, Ser: 0.7 mg/dL (ref 0.40–1.20)
GFR: 83.4 mL/min (ref >60.00)
Glucose, Bld: 212 mg/dL — ABNORMAL HIGH (ref 70–99)
Potassium: 4.1 mEq/L (ref 3.5–5.1)
Sodium: 135 mEq/L (ref 135–145)
Total Bilirubin: 0.4 mg/dL (ref 0.2–1.2)
Total Protein: 6.4 g/dL (ref 6.0–8.3)

## 2023-01-20 LAB — TSH: TSH: 0.91 u[IU]/mL (ref 0.35–5.50)

## 2023-01-20 NOTE — Assessment & Plan Note (Signed)
Repeat today

## 2023-01-20 NOTE — Assessment & Plan Note (Signed)
Increase fiber in diet  She asked about citrucil  She can take colace for stool softener  Drink plenty of water  Recheck labs today and refer back to GI

## 2023-01-20 NOTE — Telephone Encounter (Signed)
Patient states that she had office visit with her pcp and they have changed her medication from what you had advise and now she is very confused. Would like to know what exactly she needs to do.  Attempted to call patient but no answer and vm left.

## 2023-01-20 NOTE — Patient Instructions (Signed)
Diet for Irritable Bowel Syndrome When you have irritable bowel syndrome (IBS), it is very important to follow the eating habits that are best for your condition. IBS may cause various symptoms, such as pain in the abdomen, constipation, or diarrhea. Choosing the right foods can help to ease the discomfort from these symptoms. Work with your health care provider and dietitian to find the eating plan that will help to control your symptoms. What are tips for following this plan?  Keep a food diary. This will help you identify foods that cause symptoms. Write down: What you eat and when you eat it. What symptoms you have. When symptoms occur in relation to your meals, such as "pain in abdomen 2 hours after dinner." Eat your meals slowly and in a relaxed setting. Aim to eat 5-6 small meals per day. Do not skip meals. Drink enough fluid to keep your urine pale yellow. Ask your health care provider if you should take an over-the-counter probiotic to help restore healthy bacteria in your gut (digestive tract). Probiotics are foods that contain good bacteria and yeasts. Your dietitian may have specific dietary recommendations for you based on your symptoms. Your dietitian may recommend that you: Avoid foods that cause symptoms. Talk with your dietitian about other ways to get the same nutrients that are in those problem foods. Avoid foods with gluten. Gluten is a protein that is found in rye, wheat, and barley. Eat more foods that contain soluble fiber. Examples of foods with high soluble fiber include oats, seeds, and certain fruits and vegetables. Take a fiber supplement if told by your dietitian. Reduce or avoid certain foods called FODMAPs. These are foods that contain sugars that are hard for some people to digest. Ask your health care provider which foods to avoid. What foods should I avoid? The following are some foods and drinks that may make your symptoms worse: Fatty foods, such as french  fries. Foods that contain gluten, such as pasta and cereal. Dairy products, such as milk, cheese, and ice cream. Spicy foods. Alcohol. Products with caffeine, such as coffee, tea, or chocolate. Carbonated drinks, such as soda. Foods that are high in FODMAPs. These include certain fruits and vegetables. Products with sweeteners such as honey, high fructose corn syrup, sorbitol, and mannitol. The items listed above may not be a complete list of foods and beverages you should avoid. Contact a dietitian for more information. What foods are good sources of fiber? Your health care provider or dietitian may recommend that you eat more foods that contain fiber. Fiber can help to reduce constipation and other IBS symptoms. Add foods with fiber to your diet a little at a time so your body can get used to them. Too much fiber at one time might cause gas and swelling of your abdomen. The following are some foods that are good sources of fiber: Berries, such as raspberries, strawberries, and blueberries. Tomatoes. Carrots. Brown rice. Oats. Seeds, such as chia and pumpkin seeds. The items listed above may not be a complete list of recommended sources of fiber. Contact your dietitian for more options. Where to find more information International Foundation for Functional Gastrointestinal Disorders: aboutibs.org National Institute of Diabetes and Digestive and Kidney Diseases: niddk.nih.gov Summary When you have irritable bowel syndrome (IBS), it is very important to follow the eating habits that are best for your condition. IBS may cause various symptoms, such as pain in the abdomen, constipation, or diarrhea. Choosing the right foods can help to ease the   discomfort that comes from symptoms. Your health care provider or dietitian may recommend that you eat more foods that contain fiber. Keep a food diary. This will help you identify foods that cause symptoms. This information is not intended to replace  advice given to you by your health care provider. Make sure you discuss any questions you have with your health care provider. Document Revised: 05/15/2021 Document Reviewed: 05/15/2021 Elsevier Patient Education  2024 [employer].

## 2023-01-20 NOTE — Telephone Encounter (Signed)
LMTCB

## 2023-01-20 NOTE — Progress Notes (Signed)
Established Patient Office Visit  Subjective   Patient ID: Alexis [name], female    DOB: [birth year]  Age: 77 y.o. MRN: [id number]  Chief Complaint  Patient presents with   Diarrhea   Follow-up    HPI Discussed the use of AI scribe software for clinical note transcription with the patient, who gave verbal consent to proceed.  History of Present Illness   The patient, with a history of kidney disease, presents with low sodium and severe constipation. She reports a recent episode of passing out twice after straining to pass stool. She describes the stool as large and hard, and the straining as causing her to pass out. She denies any chest pain or palpitations during these episodes. She also reports feeling weak and needing to use objects around her to get up from the floor.  The patient also reports confusion, which she describes as sometimes being "right on everything" and then becoming very confused. She notes that this confusion comes and goes. She also reports a change in the color of her urine, describing it as the "most goldenest color" she's ever seen.  The patient has been drinking water, ginger, Sprite, lemonade, juice, and coffee to stay hydrated. She reports that her bowel movements are now soft, but she still feels pressure and has difficulty passing stool. She also reports that she has been using pads or Kleenex to manage incontinence.  The patient has a history of kidney disease and has been advised to avoid potassium. She also reports that she has been advised to avoid milk products. She expresses frustration with her current condition and the impact it has had on her quality of life.      Patient Active Problem List   Diagnosis Date Noted   Alternating constipation and diarrhea 01/20/2023   Aspirin long-term use 07/19/2021   Depressive disorder 10/18/2020   Gastroesophageal reflux disease 10/18/2020   Occlusion and stenosis of bilateral carotid arteries 10/18/2020    Type II or unspecified type diabetes mellitus without mention of complication, not stated as uncontrolled 10/18/2020   Vitamin D deficiency 10/18/2020   Osteoporosis 12/09/2017   Syncope 07/17/2017   Shortness of breath 07/17/2017   Hyponatremia 03/05/2017   Lichen simplex chronicus 03/05/2017   Type 2 diabetes mellitus without complication, without long-term current use of insulin (HCC) 03/05/2017   Perianal lesion 01/31/2017   Bilateral carotid artery stenosis 12/12/2016   Restless legs 12/11/2016   History of gastric bypass 12/11/2016   PTSD (post-traumatic stress disorder) 12/11/2016   Bariatric surgery status 12/11/2016   Risk for falls 12/10/2016   Other dysphagia 08/19/2016   Right foot drop 08/19/2016   Chronic vulvitis 12/25/2015   Microcalcification of left breast on mammogram 05/11/2015   Tension type headache 06/14/2013   Essential tremor 04/13/2013   Hypersomnia 04/13/2013   Past Medical History:  Diagnosis Date   Allergy    Anemia    Arthritis    Asthma    Back pain    Barrett's esophagus    Bladder infection    Cataract    Depression    Diabetes mellitus, type II (HCC)    Gallstones    Gastritis    GERD (gastroesophageal reflux disease)    Hyperlipidemia    Memory deficits    Osteoporosis 12/09/2017   PTSD (post-traumatic stress disorder)    Sleep apnea    Thyroid disease    Patient denies   Past Surgical History:  Procedure Laterality Date   CHOLECYSTECTOMY  1999   COLONOSCOPY     ESOPHAGOGASTRODUODENOSCOPY     GANGLION CYST EXCISION Left    GASTRIC BYPASS  2000   TONSILLECTOMY AND ADENOIDECTOMY     age 37   Social History   Tobacco Use   Smoking status: Never   Smokeless tobacco: Never  Vaping Use   Vaping status: Never Used  Substance Use Topics   Alcohol use: No    Alcohol/week: 0.0 standard drinks of alcohol   Drug use: No   Social History   Socioeconomic History   Marital status: Divorced    Spouse name: Not on file   Number  of children: 3   Years of education: Not on file   Highest education level: Not on file  Occupational History   Occupation: retired  Tobacco Use   Smoking status: Never   Smokeless tobacco: Never  Vaping Use   Vaping status: Never Used  Substance and Sexual Activity   Alcohol use: No    Alcohol/week: 0.0 standard drinks of alcohol   Drug use: No   Sexual activity: Never  Other Topics Concern   Not on file  Social History Narrative   Married second time after a divorce   1 son and 1 daughter living 1 daughter was stillborn   3 caffeine (coffee)/day   Retired from Engineering geologist   Social Determinants of Corporate investment banker Strain: Low Risk  (09/13/2021)   Overall Financial Resource Strain (CARDIA)    Difficulty of Paying Living Expenses: Not hard at all  Food Insecurity: No Food Insecurity (09/13/2021)   Hunger Vital Sign    Worried About Running Out of Food in the Last Year: Never true    Ran Out of Food in the Last Year: Never true  Transportation Needs: No Transportation Needs (09/13/2021)   PRAPARE - Administrator, Civil Service (Medical): No    Lack of Transportation (Non-Medical): No  Physical Activity: Sufficiently Active (09/13/2021)   Exercise Vital Sign    Days of Exercise per Week: 5 days    Minutes of Exercise per Session: 30 min  Stress: No Stress Concern Present (09/13/2021)   [employer] of Occupational Health - Occupational Stress Questionnaire    Feeling of Stress : Not at all  Social Connections: Moderately Integrated (09/13/2021)   Social Connection and Isolation Panel [NHANES]    Frequency of Communication with Friends and Family: More than three times a week    Frequency of Social Gatherings with Friends and Family: More than three times a week    Attends Religious Services: More than 4 times per year    Active Member of [employer] or Organizations: Yes    Attends Banker Meetings: More than 4 times per year    Marital Status:  Divorced  Intimate Partner Violence: Not At Risk (09/13/2021)   Humiliation, Afraid, Rape, and Kick questionnaire    Fear of Current or Ex-Partner: No    Emotionally Abused: No    Physically Abused: No    Sexually Abused: No   Family Status  Relation Name Status   Mother Marily Memos Deceased   Father Zollie Beckers Deceased   Brother Trenton Gammon   [facility]  (Not Specified)   Daughter  Deceased   Daughter  Alive   Son  Alive   Neg Hx  (Not Specified)  No partnership data on file   Family History  Problem Relation Age of Onset   Depression Mother    Heart disease  Mother    Kidney disease Mother    Alcohol abuse Father    Tremor Father    Prostate cancer Father    Other Father        Hypoglycemia   Heart disease Father    Asthma Father    Diabetes Paternal Grandmother    Other Daughter        stillborn   Colon cancer Neg Hx    Esophageal cancer Neg Hx    Rectal cancer Neg Hx    Stomach cancer Neg Hx    Allergic rhinitis Neg Hx    Eczema Neg Hx    Urticaria Neg Hx    Immunodeficiency Neg Hx    Angioedema Neg Hx    Allergies  Allergen Reactions   Metformin Diarrhea      Review of Systems  Constitutional:  Negative for fever and malaise/fatigue.  HENT:  Negative for congestion.   Eyes:  Negative for blurred vision.  Respiratory:  Negative for cough and shortness of breath.   Cardiovascular:  Negative for chest pain, palpitations and leg swelling.  Gastrointestinal:  Negative for abdominal pain, blood in stool, nausea and vomiting.  Genitourinary:  Negative for dysuria and frequency.  Musculoskeletal:  Negative for back pain and falls.  Skin:  Negative for rash.  Neurological:  Negative for dizziness, loss of consciousness and headaches.  Endo/Heme/Allergies:  Negative for environmental allergies.  Psychiatric/Behavioral:  Negative for depression. The patient is not nervous/anxious.       Objective:     BP 110/80 (BP Location: Right Arm, Patient Position: Sitting, Cuff  Size: Large)   Pulse 92   Temp 99 F (37.2 C) (Oral)   Resp 18   Ht 5\' 3"  (1.6 m)   Wt 167 lb (75.8 kg)   SpO2 98%   BMI 29.58 kg/m  BP Readings from Last 3 Encounters:  01/20/23 110/80  01/15/23 122/72  01/09/23 104/70   Wt Readings from Last 3 Encounters:  01/20/23 167 lb (75.8 kg)  01/15/23 167 lb (75.8 kg)  01/09/23 170 lb 6.4 oz (77.3 kg)   SpO2 Readings from Last 3 Encounters:  01/20/23 98%  01/15/23 98%  01/09/23 97%      Physical Exam Vitals and nursing note reviewed.  Constitutional:      General: She is not in acute distress.    Appearance: Normal appearance. She is well-developed.  HENT:     Head: Normocephalic and atraumatic.  Eyes:     General: No scleral icterus.       Right eye: No discharge.        Left eye: No discharge.  Cardiovascular:     Rate and Rhythm: Normal rate and regular rhythm.     Heart sounds: No murmur heard. Pulmonary:     Effort: Pulmonary effort is normal. No respiratory distress.     Breath sounds: Normal breath sounds.  Abdominal:     General: Bowel sounds are normal.     Palpations: Abdomen is soft.     Tenderness: There is no abdominal tenderness. There is no guarding or rebound.  Musculoskeletal:        General: Normal range of motion.     Cervical back: Normal range of motion and neck supple.     Right lower leg: No edema.     Left lower leg: No edema.  Skin:    General: Skin is warm and dry.  Neurological:     Mental Status: She is alert and  oriented to person, place, and time.  Psychiatric:        Mood and Affect: Mood normal.        Behavior: Behavior normal.        Thought Content: Thought content normal.        Judgment: Judgment normal.      No results found for any visits on 01/20/23.  Last CBC Lab Results  Component Value Date   WBC 5.6 09/02/2022   HGB 13.0 09/02/2022   HCT 38.5 09/02/2022   MCV 94.5 09/02/2022   MCH 32.1 02/24/2020   RDW 13.0 09/02/2022   PLT 217.0 09/02/2022   Last  metabolic panel Lab Results  Component Value Date   GLUCOSE 221 (H) 01/15/2023   NA 126 (L) 01/15/2023   K 4.5 01/15/2023   CL 89 (L) 01/15/2023   CO2 26 01/15/2023   BUN 14 01/15/2023   CREATININE 0.67 01/15/2023   GFR 84.29 01/15/2023   CALCIUM 9.0 01/15/2023   PHOS 3.8 01/15/2023   PROT 6.8 01/15/2023   ALBUMIN 4.3 01/15/2023   BILITOT 0.6 01/15/2023   ALKPHOS 60 01/15/2023   AST 22 01/15/2023   ALT 16 01/15/2023   Last lipids Lab Results  Component Value Date   CHOL 193 09/02/2022   HDL 91.90 09/02/2022   LDLCALC 78 09/02/2022   TRIG 115.0 09/02/2022   CHOLHDL 2 09/02/2022   Last hemoglobin A1c Lab Results  Component Value Date   HGBA1C 6.5 09/02/2022   Last thyroid functions Lab Results  Component Value Date   TSH 1.21 01/09/2023   Last vitamin D Lab Results  Component Value Date   VD25OH 40.01 01/09/2023   Last vitamin B12 and Folate Lab Results  Component Value Date   VITAMINB12 935 (H) 09/02/2022   FOLATE 18.8 04/01/2019      The 10-year ASCVD risk score (Arnett DK, et al., 2019) is: 28.3%    Assessment & Plan:   Problem List Items Addressed This Visit       Unprioritized   Hyponatremia    Repeat today       Alternating constipation and diarrhea - Primary    Increase fiber in diet  She asked about citrucil  She can take colace for stool softener  Drink plenty of water  Recheck labs today and refer back to GI      Relevant Orders   Ambulatory referral to Gastroenterology   DG Abd 2 Views   Comprehensive metabolic panel   TSH  Assessment and Plan    Hyponatremia Sodium level was low at 126. Patient reported confusion which could be related to low sodium levels. -Order repeat sodium level today.  Constipation Patient reported severe constipation with straining leading to episodes of syncope. Stools are now soft but patient still feels pressure and has difficulty passing stool. -Order abdominal x-ray to assess for possible  fecal impaction. -Recommend over-the-counter fiber supplement (Citrucel) to help regulate bowel movements. -Refer to gastroenterologist (Dr. Leone Payor) for further evaluation and management.  Syncope Patient reported episodes of syncope associated with straining due to constipation. No chest pain or palpitations reported during these episodes. -Management of constipation should help prevent further episodes of syncope related to straining. -If syncope episodes continue, consider further workup.  General Health Maintenance -Provide patient with diet for Irritable Bowel Syndrome (IBS) to help manage bowel symptoms.        Return if symptoms worsen or fail to improve.    Donato Schultz, DO

## 2023-01-21 ENCOUNTER — Other Ambulatory Visit: Payer: Medicare Other

## 2023-01-21 NOTE — Telephone Encounter (Signed)
LMTCB

## 2023-01-21 NOTE — Telephone Encounter (Signed)
Patient advised and wanted to make sure she was still suppose to reduce her dairy products.

## 2023-01-22 ENCOUNTER — Encounter: Payer: Self-pay | Admitting: Family Medicine

## 2023-01-22 NOTE — Progress Notes (Signed)
Letter mailed

## 2023-02-03 ENCOUNTER — Telehealth: Payer: Self-pay

## 2023-02-03 NOTE — Telephone Encounter (Signed)
Created new encounter for Prolia BIV. Will route encounter back once benefit verification is complete.  

## 2023-02-03 NOTE — Telephone Encounter (Signed)
 Prolia VOB initiated via [email or web address]  Next Prolia inj DUE: 03/06/23

## 2023-02-03 NOTE — Telephone Encounter (Signed)
Pt due 03/06/23- please check benefits.

## 2023-02-05 NOTE — Telephone Encounter (Signed)
Pt scheduled with Dr Patsy Lager on 03/10/23. She will get her injection on that day.

## 2023-02-05 NOTE — Telephone Encounter (Signed)
Pt ready for scheduling for PROLIA on or after : 03/06/23  Out-of-pocket cost due at time of visit: $0  Primary: MEDICARE Prolia co-insurance: 0% Admin fee co-insurance: 0%  Secondary: CIGNA-MEDSUP Prolia co-insurance:  Admin fee co-insurance:   Medical Benefit Details: Date Benefits were checked: 02/03/23 Deductible: $240 MET OF $240 REQUIRED/ Coinsurance: 0%/ Admin Fee: 0%  Prior Auth: N/A PA# Expiration Date:   # of doses approved:  Pharmacy benefit: Copay $--- If patient wants fill through the pharmacy benefit please send prescription to:  --- , and include estimated need by date in rx notes. Pharmacy will ship medication directly to the office.  Patient NOT eligible for Prolia Copay Card. Copay Card can make patient's cost as little as $25. Link to apply: [email or web address]  ** This summary of benefits is an estimation of the patient's out-of-pocket cost. Exact cost may very based on individual plan coverage.

## 2023-02-11 ENCOUNTER — Telehealth (HOSPITAL_BASED_OUTPATIENT_CLINIC_OR_DEPARTMENT_OTHER): Payer: Medicare Other | Admitting: Psychiatry

## 2023-02-11 ENCOUNTER — Encounter (HOSPITAL_COMMUNITY): Payer: Self-pay | Admitting: Psychiatry

## 2023-02-11 VITALS — Wt 167.0 lb

## 2023-02-11 DIAGNOSIS — F4310 Post-traumatic stress disorder, unspecified: Secondary | ICD-10-CM

## 2023-02-11 DIAGNOSIS — F331 Major depressive disorder, recurrent, moderate: Secondary | ICD-10-CM | POA: Diagnosis not present

## 2023-02-11 MED ORDER — VENLAFAXINE HCL ER 150 MG PO CP24
150.0000 mg | ORAL_CAPSULE | Freq: Every day | ORAL | 0 refills | Status: DC
Start: 2023-02-11 — End: 2023-06-21

## 2023-02-11 MED ORDER — LAMOTRIGINE 100 MG PO TABS
100.0000 mg | ORAL_TABLET | Freq: Every day | ORAL | 0 refills | Status: DC
Start: 2023-02-11 — End: 2023-06-21
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
## 2023-03-09 NOTE — Patient Instructions (Incomplete)
It was a video today, I will be in touch with your A1c.  Assuming all is well please see me in about 6 months  Recommend seasonal flu shot this fall, COVID booster  You saw Dr Zola Button in August of this year   We can do your bone density scan in about one year

## 2023-03-09 NOTE — Progress Notes (Unsigned)
[facility] [address], [address] [place], [place] [address] [phone]  Date:  03/10/2023   Name:  Alexis [name]   DOB:  [birth year]   MRN:  [id number]  PCP:  Pearline Cables, MD    Chief Complaint: No chief complaint on file.   History of Present Illness:  Alexis [name] is a 77 y.o. very pleasant female patient who presents with the following:  Patient seen today for 46-month follow-up Most recent visit with myself was in March of this year History of diet-controlled diabetes, gastric bypass, carotid artery stenosis, osteoporosis, benign tremor, depression, foot drop, lichen simplex chronicus   She is seen by psychiatry, Dr. Kathi Ludwig. [facility] follows her for hyponatremia-most recent visit was in June: 1. Hyponatremia. -She appears to be fairly stable. Will follow-up serum sodium level today along with serum osmolality, serum uric acid, as well as random urine osmolality/sodium. Latest serum sodium level was 137 on September 02, 2022 which was couple of weeks after she was commenced on hydrochlorothiazide for leg swelling. No significant difference with use of hydrochlorothiazide for her leg swelling. Leg swelling is likely related to venous insufficiency as she has some degree of varicose veins on both legs. Discussed use of compression stockings as well as elevation of the legs 2. Type 2 diabetes mellitus. -Management continues to be by dietary control. Follow-up hemoglobin A1c. 3. Vitamin D deficiency. -Follow-up 25-hydroxy vitamin D level. She continues to be on vitamin D at a dose of 2000 units once a day 4. Gastroesophageal reflux disease. -She remains on omeprazole at a dose of 40 mg twice a day for symptom control. 5. Depression. -Management as per psychiatry.   Flu shot COVID booster Can update for exam Can update A1c-complete labs done in March, just had a CMP completed in August Sodium 135 in  August  Bone density, mammogram up-to-date Colonoscopy 2019 Lab Results  Component Value Date   HGBA1C 6.5 09/02/2022     Patient Active Problem List   Diagnosis Date Noted   Alternating constipation and diarrhea 01/20/2023   Aspirin long-term use 07/19/2021   Depressive disorder 10/18/2020   Gastroesophageal reflux disease 10/18/2020   Occlusion and stenosis of bilateral carotid arteries 10/18/2020   Type II or unspecified type diabetes mellitus without mention of complication, not stated as uncontrolled 10/18/2020   Vitamin D deficiency 10/18/2020   Osteoporosis 12/09/2017   Syncope 07/17/2017   Shortness of breath 07/17/2017   Hyponatremia 03/05/2017   Lichen simplex chronicus 03/05/2017   Type 2 diabetes mellitus without complication, without long-term current use of insulin (HCC) 03/05/2017   Perianal lesion 01/31/2017   Bilateral carotid artery stenosis 12/12/2016   Restless legs 12/11/2016   History of gastric bypass 12/11/2016   PTSD (post-traumatic stress disorder) 12/11/2016   Bariatric surgery status 12/11/2016   Risk for falls 12/10/2016   Other dysphagia 08/19/2016   Right foot drop 08/19/2016   Chronic vulvitis 12/25/2015   Microcalcification of left breast on mammogram 05/11/2015   Tension type headache 06/14/2013   Essential tremor 04/13/2013   Hypersomnia 04/13/2013    Past Medical History:  Diagnosis Date   Allergy    Anemia    Arthritis    Asthma    Back pain    Barrett's esophagus    Bladder infection    Cataract    Depression    Diabetes mellitus, type II (HCC)    Gallstones  Gastritis    GERD (gastroesophageal reflux disease)    Hyperlipidemia    Memory deficits    Osteoporosis 12/09/2017   PTSD (post-traumatic stress disorder)    Sleep apnea    Thyroid disease    Patient denies    Past Surgical History:  Procedure Laterality Date   CHOLECYSTECTOMY  1999   COLONOSCOPY     ESOPHAGOGASTRODUODENOSCOPY     GANGLION CYST EXCISION  Left    GASTRIC BYPASS  2000   TONSILLECTOMY AND ADENOIDECTOMY     age 45    Social History   Tobacco Use   Smoking status: Never   Smokeless tobacco: Never  Vaping Use   Vaping status: Never Used  Substance Use Topics   Alcohol use: No    Alcohol/week: 0.0 standard drinks of alcohol   Drug use: No    Family History  Problem Relation Age of Onset   Depression Mother    Heart disease Mother    Kidney disease Mother    Alcohol abuse Father    Tremor Father    Prostate cancer Father    Other Father        Hypoglycemia   Heart disease Father    Asthma Father    Diabetes Paternal Grandmother    Other Daughter        stillborn   Colon cancer Neg Hx    Esophageal cancer Neg Hx    Rectal cancer Neg Hx    Stomach cancer Neg Hx    Allergic rhinitis Neg Hx    Eczema Neg Hx    Urticaria Neg Hx    Immunodeficiency Neg Hx    Angioedema Neg Hx     Allergies  Allergen Reactions   Metformin Diarrhea    Medication list has been reviewed and updated.  Current Outpatient Medications on File Prior to Visit  Medication Sig Dispense Refill   acetaminophen (TYLENOL) 500 MG tablet Take by mouth.     Alum Hydroxide-Mag Trisilicate (GAVISCON) 80-14.2 MG CHEW Chew 2 tablets by mouth 2 (two) times daily at 10 AM and 5 PM.     aspirin 81 MG EC tablet Take 81 mg by mouth at bedtime. Swallow whole.     b complex vitamins tablet Take 1 tablet by mouth 2 (two) times daily.     BIOTIN 5000 PO Take 1 tablet by mouth 2 (two) times daily.     Blood Glucose Monitoring Suppl DEVI 1 each by Does not apply route daily. May substitute to any manufacturer covered by patient's insurance. 1 each 0   cetirizine (ZYRTEC) 5 MG tablet Take 1 tablet (5 mg total) by mouth daily. Use as needed for allergies 90 tablet 3   clindamycin (CLINDAGEL) 1 % gel Apply topically 2 (two) times daily. 30 g 0   Cyanocobalamin (VITAMIN B12 TR PO) Take 500 mcg by mouth at bedtime.      diclofenac Sodium (VOLTAREN) 1 % GEL  Apply topically.     diphenhydrAMINE (BENADRYL) 25 mg capsule Take 25 mg by mouth every 6 (six) hours as needed.     Ferrous Sulfate (IRON PO) Take 1 tablet by mouth 3 (three) times a week.     fluticasone (FLONASE) 50 MCG/ACT nasal spray SPRAY 2 SPRAYS INTO EACH NOSTRIL EVERY DAY 48 mL 1   hydrochlorothiazide (HYDRODIURIL) 25 MG tablet Take 0.5 tablets (12.5 mg total) by mouth daily. Use as needed for swelling 30 tablet 3   lamoTRIgine (LAMICTAL) 100 MG tablet Take 1  tablet (100 mg total) by mouth daily. 90 tablet 0   loperamide (IMODIUM A-D) 2 MG tablet Take 2 mg by mouth as needed.     omeprazole (PRILOSEC) 40 MG capsule TAKE 1 CAPSULE BY MOUTH TWICE A DAY 180 capsule 1   Polyethyl Glycol-Propyl Glycol (SYSTANE) 0.4-0.3 % SOLN Apply 2 drops to eye 4 (four) times daily.     primidone (MYSOLINE) 50 MG tablet TAKE 2 TABLETS EVERY MORNING TAKE 1 TABLET AT NOON AND TAKE 1 TABLET AT SUPPER 360 tablet 1   sucralfate (CARAFATE) 1 g tablet Take 1 tablet twice a day. Do not take within 2 hours of any other medication. 60 tablet 1   triamcinolone cream (KENALOG) 0.1 % Apply 1 Application topically 2 (two) times daily.     venlafaxine XR (EFFEXOR-XR) 150 MG 24 hr capsule Take 1 capsule (150 mg total) by mouth daily. 90 capsule 0   Current Facility-Administered Medications on File Prior to Visit  Medication Dose Route Frequency Provider Last Rate Last Admin   denosumab (PROLIA) injection 60 mg  60 mg Subcutaneous Once Kelley Knoth, Gwenlyn Found, MD        Review of Systems:  As per HPI- otherwise negative.   Physical Examination: There were no vitals filed for this visit. There were no vitals filed for this visit. There is no height or weight on file to calculate BMI. Ideal Body Weight:    GEN: no acute distress. HEENT: Atraumatic, Normocephalic.  Ears and Nose: No external deformity. CV: RRR, No M/G/R. No JVD. No thrill. No extra heart sounds. PULM: CTA B, no wheezes, crackles, rhonchi. No  retractions. No resp. distress. No accessory muscle use. ABD: S, NT, ND, +BS. No rebound. No HSM. EXTR: No c/c/e PSYCH: Normally interactive. Conversant.    Assessment and Plan: ***  Signed Abbe Amsterdam, MD

## 2023-03-10 ENCOUNTER — Encounter: Payer: Self-pay | Admitting: Family Medicine

## 2023-03-10 ENCOUNTER — Ambulatory Visit: Payer: Medicare Other | Admitting: Family Medicine

## 2023-03-10 ENCOUNTER — Telehealth: Payer: Self-pay

## 2023-03-10 VITALS — BP 122/80 | HR 71 | Temp 98.3°F | Resp 18 | Ht 63.0 in | Wt 165.8 lb

## 2023-03-10 DIAGNOSIS — G250 Essential tremor: Secondary | ICD-10-CM | POA: Diagnosis not present

## 2023-03-10 DIAGNOSIS — E871 Hypo-osmolality and hyponatremia: Secondary | ICD-10-CM | POA: Diagnosis not present

## 2023-03-10 DIAGNOSIS — R0981 Nasal congestion: Secondary | ICD-10-CM

## 2023-03-10 DIAGNOSIS — Z9884 Bariatric surgery status: Secondary | ICD-10-CM | POA: Diagnosis not present

## 2023-03-10 DIAGNOSIS — Z23 Encounter for immunization: Secondary | ICD-10-CM | POA: Diagnosis not present

## 2023-03-10 DIAGNOSIS — M810 Age-related osteoporosis without current pathological fracture: Secondary | ICD-10-CM

## 2023-03-10 DIAGNOSIS — E119 Type 2 diabetes mellitus without complications: Secondary | ICD-10-CM

## 2023-03-10 LAB — BASIC METABOLIC PANEL
BUN: 13 (ref 4–21)
BUN: 13 mg/dL (ref 6–23)
CO2: 26 mEq/L (ref 19–32)
CO2: 26 — AB (ref 13–22)
Calcium: 8.8 mg/dL (ref 8.4–10.5)
Chloride: 98 mEq/L (ref 96–112)
Chloride: 98 — AB (ref 99–108)
Creatinine, Ser: 0.69 mg/dL (ref 0.40–1.20)
Creatinine: 0.6 (ref 0.5–1.1)
GFR: 83.61 mL/min (ref >60.00)
Glucose, Bld: 160 mg/dL — ABNORMAL HIGH (ref 70–99)
Glucose: 160
Potassium: 4.5 mEq/L (ref 3.5–5.1)
Potassium: 4.5 meq/L (ref 3.5–5.1)
Sodium: 133 mEq/L — ABNORMAL LOW (ref 135–145)
Sodium: 133 — AB (ref 137–147)

## 2023-03-10 LAB — CBC
HCT: 40.4 % (ref 36.0–46.0)
Hemoglobin: 13.4 g/dL (ref 12.0–15.0)
MCHC: 33.1 g/dL (ref 30.0–36.0)
MCV: 94.4 fl (ref 78.0–100.0)
Platelets: 221 10*3/uL (ref 150.0–400.0)
RBC: 4.28 Mil/uL (ref 3.87–5.11)
RDW: 13.2 % (ref 11.5–15.5)
WBC: 5.6 10*3/uL (ref 4.0–10.5)

## 2023-03-10 LAB — CBC AND DIFFERENTIAL
HCT: 40 (ref 36–46)
Hemoglobin: 13.4 (ref 12.0–16.0)
Platelets: 221 10*3/uL (ref 150–400)
WBC: 5.6

## 2023-03-10 LAB — COMPREHENSIVE METABOLIC PANEL: Calcium: 8.8 (ref 8.7–10.7)

## 2023-03-10 LAB — HEMOGLOBIN A1C: Hgb A1c MFr Bld: 7 % — ABNORMAL HIGH (ref 4.6–6.5)

## 2023-03-10 MED ORDER — DENOSUMAB 60 MG/ML ~~LOC~~ SOSY
60.0000 mg | PREFILLED_SYRINGE | Freq: Once | SUBCUTANEOUS | Status: AC
Start: 2023-03-10 — End: 2023-03-10
  Administered 2023-03-10: 60 mg via SUBCUTANEOUS

## 2023-03-10 MED ORDER — FLUTICASONE PROPIONATE 50 MCG/ACT NA SUSP
2.0000 | Freq: Every day | NASAL | 3 refills | Status: DC
Start: 2023-03-10 — End: 2023-06-09

## 2023-03-10 NOTE — Telephone Encounter (Signed)
Pt received prolia today.  Next injection due on/after 09/07/2023

## 2023-03-19 ENCOUNTER — Telehealth: Payer: Self-pay | Admitting: Family Medicine

## 2023-03-19 NOTE — Telephone Encounter (Signed)
"  caller says she is feeling better and does not want to finsh triage"  Pt did not complete Triage process.

## 2023-03-19 NOTE — Telephone Encounter (Signed)
Initial Comment Patient is experiencing dizziness, and feels like she will fall when standing up along with a headache. Translation No Nurse Assessment Nurse: Freida Busman, RN, Jonette Eva Date/Time (Eastern Time): 03/19/2023 10:59:39 AM Confirm and document reason for call. If symptomatic, describe symptoms. ---Caller states she is having dizziness when she changes direction it feels like she will fall when she stands and she has a headache the s/s started around 4pm did sleep last night she is having corned beef to eat no cp no shortness of breath Does the patient have any new or worsening symptoms? ---Yes Will a triage be completed? ---Yes Related visit to physician within the last 2 weeks? ---No Does the PT have any chronic conditions? (i.e. diabetes, asthma, this includes High risk factors for pregnancy, etc.) ---Yes List chronic conditions. ---dm Is this a behavioral health or substance abuse call? ---No Guidelines Guideline Title Affirmed Question Affirmed Notes Nurse Date/Time (Eastern Time) Disp. Time [name] Time) Disposition Final User 03/19/2023 10:57:01 AM Send to Urgent Ruta Hinds 03/19/2023 11:05:35 AM Clinical Call Yes Freida Busman, RN, Jonette Eva Final Disposition 03/19/2023 11:05:35 AM Clinical Call Yes Freida Busman, RN, Jonette Eva PLEASE NOTE: All timestamps contained within this report are represented as [place] Standard Time. CONFIDENTIALTY NOTICE: This fax transmission is intended only for the addressee. It contains information that is legally privileged, confidential or otherwise protected from use or disclosure. If you are not the intended recipient, you are strictly prohibited from reviewing, disclosing, copying using or disseminating any of this information or taking any action in reliance on or regarding this information. If you have received this fax in error, please notify us immediately by telephone so that we can arrange for its return to [place]. Phone: [phone], Toll-Free:  [phone], Fax: [phone] Page: 2 of 2 Call Id: [id number] Comments User: Felipe Drone, RN Date/Time [name] Time): 03/19/2023 11:05:26 AM caller says she is feeling better and does not want to finsh triage

## 2023-03-19 NOTE — Telephone Encounter (Signed)
Pt was called and was triage because she mentioned that she has been Dizzy, can't coordinate anything, and has had a decline in memory loss, since 4 pm yesterday. Please follow-up and advise pt.

## 2023-04-07 DIAGNOSIS — E119 Type 2 diabetes mellitus without complications: Secondary | ICD-10-CM | POA: Diagnosis not present

## 2023-04-07 DIAGNOSIS — E871 Hypo-osmolality and hyponatremia: Secondary | ICD-10-CM | POA: Diagnosis not present

## 2023-04-07 DIAGNOSIS — E559 Vitamin D deficiency, unspecified: Secondary | ICD-10-CM | POA: Diagnosis not present

## 2023-04-07 DIAGNOSIS — K219 Gastro-esophageal reflux disease without esophagitis: Secondary | ICD-10-CM | POA: Diagnosis not present

## 2023-04-21 ENCOUNTER — Other Ambulatory Visit: Payer: Self-pay | Admitting: Family Medicine

## 2023-04-22 ENCOUNTER — Other Ambulatory Visit: Payer: Self-pay | Admitting: Family Medicine

## 2023-04-22 DIAGNOSIS — R531 Weakness: Secondary | ICD-10-CM | POA: Diagnosis not present

## 2023-04-22 DIAGNOSIS — E119 Type 2 diabetes mellitus without complications: Secondary | ICD-10-CM | POA: Diagnosis not present

## 2023-04-22 DIAGNOSIS — G250 Essential tremor: Secondary | ICD-10-CM | POA: Diagnosis not present

## 2023-04-22 DIAGNOSIS — R4701 Aphasia: Secondary | ICD-10-CM | POA: Diagnosis not present

## 2023-04-22 DIAGNOSIS — R4182 Altered mental status, unspecified: Secondary | ICD-10-CM | POA: Diagnosis not present

## 2023-04-22 DIAGNOSIS — Z7401 Bed confinement status: Secondary | ICD-10-CM | POA: Diagnosis not present

## 2023-04-22 DIAGNOSIS — I071 Rheumatic tricuspid insufficiency: Secondary | ICD-10-CM | POA: Diagnosis not present

## 2023-04-22 DIAGNOSIS — E1149 Type 2 diabetes mellitus with other diabetic neurological complication: Secondary | ICD-10-CM | POA: Diagnosis not present

## 2023-04-22 DIAGNOSIS — F419 Anxiety disorder, unspecified: Secondary | ICD-10-CM | POA: Diagnosis not present

## 2023-04-22 DIAGNOSIS — Z95828 Presence of other vascular implants and grafts: Secondary | ICD-10-CM | POA: Diagnosis not present

## 2023-04-22 DIAGNOSIS — Z6831 Body mass index (BMI) 31.0-31.9, adult: Secondary | ICD-10-CM | POA: Diagnosis not present

## 2023-04-22 DIAGNOSIS — R600 Localized edema: Secondary | ICD-10-CM | POA: Diagnosis not present

## 2023-04-22 DIAGNOSIS — I618 Other nontraumatic intracerebral hemorrhage: Secondary | ICD-10-CM | POA: Diagnosis not present

## 2023-04-22 DIAGNOSIS — I6200 Nontraumatic subdural hemorrhage, unspecified: Secondary | ICD-10-CM | POA: Diagnosis not present

## 2023-04-22 DIAGNOSIS — R52 Pain, unspecified: Secondary | ICD-10-CM | POA: Diagnosis not present

## 2023-04-22 DIAGNOSIS — I69354 Hemiplegia and hemiparesis following cerebral infarction affecting left non-dominant side: Secondary | ICD-10-CM | POA: Diagnosis not present

## 2023-04-22 DIAGNOSIS — R1310 Dysphagia, unspecified: Secondary | ICD-10-CM | POA: Diagnosis not present

## 2023-04-22 DIAGNOSIS — R1312 Dysphagia, oropharyngeal phase: Secondary | ICD-10-CM | POA: Diagnosis not present

## 2023-04-22 DIAGNOSIS — I611 Nontraumatic intracerebral hemorrhage in hemisphere, cortical: Secondary | ICD-10-CM | POA: Diagnosis not present

## 2023-04-22 DIAGNOSIS — E669 Obesity, unspecified: Secondary | ICD-10-CM | POA: Diagnosis not present

## 2023-04-22 DIAGNOSIS — I619 Nontraumatic intracerebral hemorrhage, unspecified: Secondary | ICD-10-CM | POA: Diagnosis not present

## 2023-04-22 DIAGNOSIS — R251 Tremor, unspecified: Secondary | ICD-10-CM | POA: Diagnosis not present

## 2023-04-22 DIAGNOSIS — Z515 Encounter for palliative care: Secondary | ICD-10-CM | POA: Diagnosis not present

## 2023-04-22 DIAGNOSIS — I615 Nontraumatic intracerebral hemorrhage, intraventricular: Secondary | ICD-10-CM | POA: Diagnosis not present

## 2023-04-22 DIAGNOSIS — R0902 Hypoxemia: Secondary | ICD-10-CM | POA: Diagnosis not present

## 2023-04-22 DIAGNOSIS — D72829 Elevated white blood cell count, unspecified: Secondary | ICD-10-CM | POA: Diagnosis not present

## 2023-04-22 DIAGNOSIS — R918 Other nonspecific abnormal finding of lung field: Secondary | ICD-10-CM | POA: Diagnosis not present

## 2023-04-22 DIAGNOSIS — G936 Cerebral edema: Secondary | ICD-10-CM | POA: Diagnosis not present

## 2023-04-22 DIAGNOSIS — J9601 Acute respiratory failure with hypoxia: Secondary | ICD-10-CM | POA: Diagnosis not present

## 2023-04-22 DIAGNOSIS — R471 Dysarthria and anarthria: Secondary | ICD-10-CM | POA: Diagnosis not present

## 2023-04-22 DIAGNOSIS — R509 Fever, unspecified: Secondary | ICD-10-CM | POA: Diagnosis not present

## 2023-04-22 DIAGNOSIS — Z8701 Personal history of pneumonia (recurrent): Secondary | ICD-10-CM | POA: Diagnosis not present

## 2023-04-22 DIAGNOSIS — J984 Other disorders of lung: Secondary | ICD-10-CM | POA: Diagnosis not present

## 2023-04-22 DIAGNOSIS — Z66 Do not resuscitate: Secondary | ICD-10-CM | POA: Diagnosis not present

## 2023-04-22 DIAGNOSIS — Z7409 Other reduced mobility: Secondary | ICD-10-CM | POA: Diagnosis not present

## 2023-04-22 DIAGNOSIS — G935 Compression of brain: Secondary | ICD-10-CM | POA: Diagnosis not present

## 2023-04-22 DIAGNOSIS — R4781 Slurred speech: Secondary | ICD-10-CM | POA: Diagnosis not present

## 2023-04-22 DIAGNOSIS — I69391 Dysphagia following cerebral infarction: Secondary | ICD-10-CM | POA: Diagnosis not present

## 2023-04-22 DIAGNOSIS — E1349 Other specified diabetes mellitus with other diabetic neurological complication: Secondary | ICD-10-CM | POA: Diagnosis not present

## 2023-04-22 DIAGNOSIS — G9389 Other specified disorders of brain: Secondary | ICD-10-CM | POA: Diagnosis not present

## 2023-04-22 DIAGNOSIS — G8194 Hemiplegia, unspecified affecting left nondominant side: Secondary | ICD-10-CM | POA: Diagnosis not present

## 2023-04-22 DIAGNOSIS — R29810 Facial weakness: Secondary | ICD-10-CM | POA: Diagnosis not present

## 2023-04-22 DIAGNOSIS — J690 Pneumonitis due to inhalation of food and vomit: Secondary | ICD-10-CM | POA: Diagnosis not present

## 2023-04-22 DIAGNOSIS — F32A Depression, unspecified: Secondary | ICD-10-CM | POA: Diagnosis not present

## 2023-04-22 DIAGNOSIS — I609 Nontraumatic subarachnoid hemorrhage, unspecified: Secondary | ICD-10-CM | POA: Diagnosis not present

## 2023-04-22 DIAGNOSIS — I10 Essential (primary) hypertension: Secondary | ICD-10-CM | POA: Diagnosis not present

## 2023-04-22 DIAGNOSIS — R7989 Other specified abnormal findings of blood chemistry: Secondary | ICD-10-CM | POA: Diagnosis not present

## 2023-04-22 DIAGNOSIS — I161 Hypertensive emergency: Secondary | ICD-10-CM | POA: Diagnosis not present

## 2023-04-22 DIAGNOSIS — E871 Hypo-osmolality and hyponatremia: Secondary | ICD-10-CM | POA: Diagnosis not present

## 2023-04-22 DIAGNOSIS — R29712 NIHSS score 12: Secondary | ICD-10-CM | POA: Diagnosis not present

## 2023-04-22 DIAGNOSIS — I612 Nontraumatic intracerebral hemorrhage in hemisphere, unspecified: Secondary | ICD-10-CM | POA: Diagnosis not present

## 2023-04-22 DIAGNOSIS — Z4682 Encounter for fitting and adjustment of non-vascular catheter: Secondary | ICD-10-CM | POA: Diagnosis not present

## 2023-04-22 DIAGNOSIS — J188 Other pneumonia, unspecified organism: Secondary | ICD-10-CM | POA: Diagnosis not present

## 2023-04-22 DIAGNOSIS — R0600 Dyspnea, unspecified: Secondary | ICD-10-CM | POA: Diagnosis not present

## 2023-04-22 DIAGNOSIS — E1165 Type 2 diabetes mellitus with hyperglycemia: Secondary | ICD-10-CM | POA: Diagnosis not present

## 2023-04-23 ENCOUNTER — Telehealth: Payer: Self-pay | Admitting: Family Medicine

## 2023-04-23 NOTE — Telephone Encounter (Signed)
Called Dr Linwood Dibbles back and shared recent history.  Thanked him for taking care of Alexis [name].

## 2023-04-23 NOTE — Telephone Encounter (Signed)
Dr. Consuella Lose called to speak with Dr. 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 Dr. Linwood Dibbles stated that he wants to know if there's anything they should be aware about. He explained that they have only been in contact with the patients Son and another family member. Her family is unsure of her medical history. Please call and advise at his direct cell number: [phone].

## 2023-05-12 ENCOUNTER — Telehealth (HOSPITAL_COMMUNITY): Payer: Medicare Other | Admitting: Psychiatry

## 2023-05-18 DEATH — deceased

## 2023-05-26 ENCOUNTER — Telehealth (HOSPITAL_BASED_OUTPATIENT_CLINIC_OR_DEPARTMENT_OTHER): Payer: Self-pay | Admitting: Psychiatry

## 2023-05-26 ENCOUNTER — Encounter (HOSPITAL_COMMUNITY): Payer: Self-pay

## 2023-05-26 DIAGNOSIS — Z91199 Patient's noncompliance with other medical treatment and regimen due to unspecified reason: Secondary | ICD-10-CM

## 2023-05-26 NOTE — Progress Notes (Signed)
   Complete physical exam  Patient: Alexis [name]   DOB: 04/06/1999   77 y.o. Female  MRN: [id number]  Subjective:    No chief complaint on file.   Alexis [name] is a 77 y.o. female who presents today for a complete physical exam. She reports consuming a {diet types:17450} diet. {types:19826} She generally feels {DESC; WELL/FAIRLY WELL/POORLY:18703}. She reports sleeping {DESC; WELL/FAIRLY WELL/POORLY:18703}. She {does/does not:200015} have additional problems to discuss today.    Most recent fall risk assessment:    12/12/2021   10:42 AM  Fall Risk   Falls in the past year? 0  Number falls in past yr: 0  Injury with Fall? 0  Risk for fall due to : No Fall Risks  Follow up Falls evaluation completed     Most recent depression screenings:    12/12/2021   10:42 AM 11/02/2020   10:46 AM  PHQ 2/9 Scores  PHQ - 2 Score 0 0  PHQ- 9 Score 5     {VISON DENTAL STD PSA (Optional):27386}  {History (Optional):23778}  Patient Care Team: Jessup, Joy, NP as PCP - General (Nurse Practitioner)   Outpatient Medications Prior to Visit  Medication Sig   fluticasone (FLONASE) 50 MCG/ACT nasal spray Place 2 sprays into both nostrils in the morning and at bedtime. After 7 days, reduce to once daily.   norgestimate-ethinyl estradiol (SPRINTEC 28) 0.25-35 MG-MCG tablet Take 1 tablet by mouth daily.   Nystatin POWD Apply liberally to affected area 2 times per day   spironolactone (ALDACTONE) 100 MG tablet Take 1 tablet (100 mg total) by mouth daily.   No facility-administered medications prior to visit.    ROS        Objective:     There were no vitals taken for this visit. {Vitals History (Optional):23777}  Physical Exam   No results found for any visits on 01/17/22. {Show previous labs (optional):23779}    Assessment & Plan:    Routine Health Maintenance and Physical Exam  Immunization History  Administered Date(s) Administered   DTaP 06/20/1999, 08/16/1999,  10/25/1999, 07/10/2000, 01/24/2004   Hepatitis A 11/20/2007, 11/25/2008   Hepatitis B 04/07/1999, 05/15/1999, 10/25/1999   HiB (PRP-OMP) 06/20/1999, 08/16/1999, 10/25/1999, 07/10/2000   IPV 06/20/1999, 08/16/1999, 04/14/2000, 01/24/2004   Influenza,inj,Quad PF,6+ Mos 02/25/2014   Influenza-Unspecified 05/27/2012   MMR 04/14/2001, 01/24/2004   Meningococcal Polysaccharide 11/25/2011   Pneumococcal Conjugate-13 07/10/2000   Pneumococcal-Unspecified 10/25/1999, 01/08/2000   Tdap 11/25/2011   Varicella 04/14/2000, 11/20/2007    Health Maintenance  Topic Date Due   HIV Screening  Never done   Hepatitis C Screening  Never done   INFLUENZA VACCINE  01/15/2022   PAP-Cervical Cytology Screening  01/17/2022 (Originally 04/05/2020)   PAP SMEAR-Modifier  01/17/2022 (Originally 04/05/2020)   TETANUS/TDAP  01/17/2022 (Originally 11/24/2021)   HPV VACCINES  Discontinued   COVID-19 Vaccine  Discontinued    Discussed health benefits of physical activity, and encouraged her to engage in regular exercise appropriate for her age and condition.  Problem List Items Addressed This Visit   None Visit Diagnoses     Annual physical exam    -  Primary   Cervical cancer screening       Need for Tdap vaccination          No follow-ups on file.     Joy Jessup, NP   

## 2023-06-07 ENCOUNTER — Other Ambulatory Visit: Payer: Self-pay | Admitting: Family Medicine

## 2023-06-07 DIAGNOSIS — R0981 Nasal congestion: Secondary | ICD-10-CM

## 2023-07-14 ENCOUNTER — Ambulatory Visit: Payer: Medicare Other | Admitting: Internal Medicine

## 2023-09-08 ENCOUNTER — Ambulatory Visit: Payer: Medicare Other | Admitting: Family Medicine
